# Patient Record
Sex: Male | Born: 1975 | Race: Black or African American | Hispanic: No | Marital: Married | Smoking: Never smoker
Health system: Southern US, Community
[De-identification: ages and names within clinical notes are randomized; demographics above are authoritative.]

## PROBLEM LIST (undated history)

## (undated) DIAGNOSIS — IMO0001 Reserved for inherently not codable concepts without codable children: Secondary | ICD-10-CM

## (undated) DIAGNOSIS — R739 Hyperglycemia, unspecified: Secondary | ICD-10-CM

## (undated) DIAGNOSIS — R42 Dizziness and giddiness: Secondary | ICD-10-CM

## (undated) DIAGNOSIS — R030 Elevated blood-pressure reading, without diagnosis of hypertension: Secondary | ICD-10-CM

## (undated) DIAGNOSIS — I10 Essential (primary) hypertension: Secondary | ICD-10-CM

## (undated) DIAGNOSIS — E785 Hyperlipidemia, unspecified: Secondary | ICD-10-CM

## (undated) DIAGNOSIS — K219 Gastro-esophageal reflux disease without esophagitis: Secondary | ICD-10-CM

## (undated) HISTORY — DX: Elevated blood-pressure reading, without diagnosis of hypertension: R03.0

## (undated) HISTORY — DX: Hyperlipidemia, unspecified: E78.5

## (undated) HISTORY — DX: Gastro-esophageal reflux disease without esophagitis: K21.9

## (undated) HISTORY — DX: Dizziness and giddiness: R42

## (undated) HISTORY — DX: Reserved for inherently not codable concepts without codable children: IMO0001

## (undated) HISTORY — DX: Hyperglycemia, unspecified: R73.9

## (undated) HISTORY — DX: Essential (primary) hypertension: I10

---

## 2003-07-15 ENCOUNTER — Other Ambulatory Visit: Admission: RE | Admit: 2003-07-15 | Discharge: 2003-07-15 | Payer: Self-pay | Admitting: Dermatology

## 2003-07-27 ENCOUNTER — Ambulatory Visit (HOSPITAL_COMMUNITY): Admission: RE | Admit: 2003-07-27 | Discharge: 2003-07-27 | Payer: Self-pay | Admitting: Family Medicine

## 2004-08-01 ENCOUNTER — Ambulatory Visit (HOSPITAL_COMMUNITY): Admission: RE | Admit: 2004-08-01 | Discharge: 2004-08-01 | Payer: Self-pay | Admitting: Family Medicine

## 2004-08-15 ENCOUNTER — Ambulatory Visit (HOSPITAL_COMMUNITY): Admission: RE | Admit: 2004-08-15 | Discharge: 2004-08-15 | Payer: Self-pay | Admitting: Family Medicine

## 2008-07-14 ENCOUNTER — Emergency Department: Payer: Self-pay | Admitting: Emergency Medicine

## 2010-07-03 ENCOUNTER — Ambulatory Visit (HOSPITAL_COMMUNITY): Admission: RE | Admit: 2010-07-03 | Discharge: 2010-07-03 | Payer: Self-pay | Admitting: Family Medicine

## 2011-02-02 NOTE — Procedures (Signed)
NAMESUDEEP, Steven Gallagher                ACCOUNT NO.:  [id number]   MEDICAL RECORD NO.:  [id number]          PATIENT TYPE:  OUT   LOCATION:  [facility]                          FACILITY:  [facility]   PHYSICIAN:  Scott A. Gerda Diss, MD    DATE OF BIRTH:  Aug 22, 1976   DATE OF PROCEDURE:  DATE OF DISCHARGE:                                    STRESS TEST   INDICATIONS:  Chest discomfort.   PROTOCOL:  Bruce protocol.  Resting EKG no acute ST segment changes are  noted.  Normal blood pressure.   HEART RATE RESPONSE TO EXERCISE:  The patient's heart rate gradually went up  through the next several stages.  He went through the first 3 stages with no  problems.  In the fourth stage he did have an occasional PVC, but no  couplets and reached a peak heart rate of 178.   ST SEGMENT CHANGES IN RESPONSE TO EXERCISE:  The patient did not have any  significant ST segment changes during the exercise and was considered  normal.   BLOOD PRESSURE RESPONSE TO EXERCISE:  The patient had a mild increase in  blood pressure, but it is not considered abnormal.   RECOVERY PHASE:  Fine.   SYMPTOMATOLOGY:  None.   INTERPRETATION:  Normal stress test.  Encourage regular exercise routine.     Scot   SAL/MEDQ  D:  08/15/2004  T:  08/15/2004  Job:  [id number]

## 2013-01-19 ENCOUNTER — Other Ambulatory Visit: Payer: Self-pay | Admitting: *Deleted

## 2013-01-19 MED ORDER — PANTOPRAZOLE SODIUM 40 MG PO TBEC: 40.0000 mg | DELAYED_RELEASE_TABLET | Freq: Every day | ORAL | Status: DC

## 2013-01-27 ENCOUNTER — Ambulatory Visit (INDEPENDENT_AMBULATORY_CARE_PROVIDER_SITE_OTHER): Payer: Managed Care, Other (non HMO) | Admitting: Family Medicine

## 2013-01-27 ENCOUNTER — Encounter: Payer: Self-pay | Admitting: Family Medicine

## 2013-01-27 VITALS — BP 132/90 | Temp 98.4°F | Wt 159.2 lb

## 2013-01-27 DIAGNOSIS — J029 Acute pharyngitis, unspecified: Secondary | ICD-10-CM

## 2013-01-27 DIAGNOSIS — K219 Gastro-esophageal reflux disease without esophagitis: Secondary | ICD-10-CM | POA: Insufficient documentation

## 2013-01-27 MED ORDER — AZITHROMYCIN 250 MG PO TABS: ORAL_TABLET | ORAL | Status: DC

## 2013-01-27 NOTE — Progress Notes (Signed)
  Subjective:    Patient ID: Steven Gallagher, male    DOB: [birth year], 37 y.o.   MRN: [id number]  Sore Throat  This is a new problem. The current episode started in the past 7 days (face tingling). The problem has been gradually worsening. There has been no fever. The pain is at a severity of 4/10. The pain is moderate. He has tried nothing for the symptoms.   Reflux overall stable on daily protonix  Mild allergy symptoms this few weeks Review of Systems    ROS otherwise negative Objective:   Physical Exam  Alert no acute distress. Blood pressure improved to 130/82 on repeat. Lungs clear. Heart regular rate and rhythm. Pharynx erythematous. Glands swollen somewhat anteriorly. TMs normal. Abdominal exam benign.      Assessment & Plan:  Impression #1 pharyngitis with lymphadenitis #2 reflux clinically stable. Plan Z-Pak. Symptomatic care discussed. Be sure to use Protonix daily. WSL

## 2013-03-16 ENCOUNTER — Encounter: Payer: Self-pay | Admitting: *Deleted

## 2013-03-16 ENCOUNTER — Telehealth: Payer: Self-pay | Admitting: Family Medicine

## 2013-03-16 DIAGNOSIS — Z0000 Encounter for general adult medical examination without abnormal findings: Secondary | ICD-10-CM

## 2013-03-16 DIAGNOSIS — Z79899 Other long term (current) drug therapy: Secondary | ICD-10-CM

## 2013-03-16 DIAGNOSIS — Z7251 High risk heterosexual behavior: Secondary | ICD-10-CM

## 2013-03-16 NOTE — Telephone Encounter (Signed)
Nurse/Dr. Lorin Picket  Patient is requesting for blood work. (HIV testing) ** He does have an upcoming appointment 03/24/2013 and would like to have the blood work done before the appointment.

## 2013-03-16 NOTE — Telephone Encounter (Signed)
Blood work papers printed and left up front for patient pick up. Patient notified. 

## 2013-03-16 NOTE — Telephone Encounter (Signed)
Is he due his regular blood work also?

## 2013-03-16 NOTE — Telephone Encounter (Signed)
Lip liv met7 hiv

## 2013-03-17 LAB — HEPATIC FUNCTION PANEL
ALT: 19 U/L (ref 0–53)
AST: 24 U/L (ref 0–37)
Albumin: 4.7 g/dL (ref 3.5–5.2)
Alkaline Phosphatase: 81 U/L (ref 39–117)
Bilirubin, Direct: 0.1 mg/dL (ref 0.0–0.3)
Indirect Bilirubin: 0.6 mg/dL (ref 0.0–0.9)
Total Bilirubin: 0.7 mg/dL (ref 0.3–1.2)
Total Protein: 7.3 g/dL (ref 6.0–8.3)

## 2013-03-17 LAB — LIPID PANEL
Cholesterol: 176 mg/dL (ref 0–200)
HDL: 56 mg/dL (ref >39)
LDL Cholesterol: 104 mg/dL — ABNORMAL HIGH (ref 0–99)
Total CHOL/HDL Ratio: 3.1 Ratio
Triglycerides: 81 mg/dL (ref <150)
VLDL: 16 mg/dL (ref 0–40)

## 2013-03-17 LAB — BASIC METABOLIC PANEL
BUN: 15 mg/dL (ref 6–23)
CO2: 27 mEq/L (ref 19–32)
Calcium: 9.8 mg/dL (ref 8.4–10.5)
Chloride: 102 mEq/L (ref 96–112)
Creat: 1.01 mg/dL (ref 0.50–1.35)
Glucose, Bld: 95 mg/dL (ref 70–99)
Potassium: 3.9 mEq/L (ref 3.5–5.3)
Sodium: 141 mEq/L (ref 135–145)

## 2013-03-18 LAB — HIV ANTIBODY (ROUTINE TESTING W REFLEX): HIV: NONREACTIVE

## 2013-03-24 ENCOUNTER — Ambulatory Visit (INDEPENDENT_AMBULATORY_CARE_PROVIDER_SITE_OTHER): Payer: Managed Care, Other (non HMO) | Admitting: Family Medicine

## 2013-03-24 ENCOUNTER — Encounter: Payer: Self-pay | Admitting: Family Medicine

## 2013-03-24 VITALS — BP 130/68 | Temp 98.6°F | Wt 164.0 lb

## 2013-03-24 DIAGNOSIS — K219 Gastro-esophageal reflux disease without esophagitis: Secondary | ICD-10-CM

## 2013-03-24 NOTE — Progress Notes (Signed)
  Subjective:    Patient ID: Steven Gallagher, male    DOB: 1976/06/27, 37 y.o.   MRN: [id number]  Gastrophageal Reflux This is a new problem. The current episode started 1 to 4 weeks ago. The symptoms are aggravated by caffeine. Treatments tried: protonix 40mg  one a day.   patient also is concerned regarding his lab work. This was reviewed with him in detail. Overall looks good I encouraged him to follow a healthy diet. He does take in more caffeine than what he should I encouraged him to cut back on this he sometimes gets throat discomfort at nighttime but does not cause any swallowing difficulties no soreness during the day PMH reflux, minimal hyperlipidemia.    Review of Systems    no chest pain no shortness of breath no swelling in the legs Objective:   Physical Exam Lungs are clear hearts regular abdomen soft pulse normal extremities no edema blood pressure noted       Assessment & Plan:  Reflux good control cut back on caffeine use if continued throat discomfort let us know we will set up ENT visit Followup in 6 months Lab work looks good reassurance

## 2013-05-05 ENCOUNTER — Ambulatory Visit (INDEPENDENT_AMBULATORY_CARE_PROVIDER_SITE_OTHER): Payer: Managed Care, Other (non HMO) | Admitting: Family Medicine

## 2013-05-05 ENCOUNTER — Encounter: Payer: Self-pay | Admitting: Family Medicine

## 2013-05-05 VITALS — BP 132/90 | Temp 98.5°F | Ht 68.0 in | Wt 163.0 lb

## 2013-05-05 DIAGNOSIS — R197 Diarrhea, unspecified: Secondary | ICD-10-CM

## 2013-05-05 LAB — CBC WITH DIFFERENTIAL/PLATELET
Basophils Absolute: 0 10*3/uL (ref 0.0–0.1)
Basophils Relative: 1 % (ref 0–1)
Eosinophils Absolute: 0.1 10*3/uL (ref 0.0–0.7)
Eosinophils Relative: 2 % (ref 0–5)
HCT: 43.5 % (ref 39.0–52.0)
Hemoglobin: 14.5 g/dL (ref 13.0–17.0)
Lymphocytes Relative: 35 % (ref 12–46)
Lymphs Abs: 2 10*3/uL (ref 0.7–4.0)
MCH: 25.9 pg — ABNORMAL LOW (ref 26.0–34.0)
MCHC: 33.3 g/dL (ref 30.0–36.0)
MCV: 77.8 fL — ABNORMAL LOW (ref 78.0–100.0)
Monocytes Absolute: 0.6 10*3/uL (ref 0.1–1.0)
Monocytes Relative: 10 % (ref 3–12)
Neutro Abs: 2.9 10*3/uL (ref 1.7–7.7)
Neutrophils Relative %: 52 % (ref 43–77)
Platelets: 217 10*3/uL (ref 150–400)
RBC: 5.59 MIL/uL (ref 4.22–5.81)
RDW: 14.7 % (ref 11.5–15.5)
WBC: 5.6 10*3/uL (ref 4.0–10.5)

## 2013-05-05 MED ORDER — ONDANSETRON HCL 8 MG PO TABS: 8.0000 mg | ORAL_TABLET | Freq: Three times a day (TID) | ORAL | Status: DC | PRN

## 2013-05-05 NOTE — Progress Notes (Signed)
  Subjective:    Patient ID: Steven Gallagher, male    DOB: 1976-06-29, 37 y.o.   MRN: [id number]  Diarrhea  This is a new problem. The current episode started 1 to 4 weeks ago. Associated symptoms include headaches. Associated symptoms comments: Neck pain, nausea. He has tried nothing for the symptoms.   Patient states she did have a small amount of blood in the stool when he had the diarrhea but he has not noticed any blood in any other time he denies abdominal pain constipation or change in bowel habits or weight loss. No family history of premature colon cancer.   Review of Systems  Gastrointestinal: Positive for diarrhea.  Neurological: Positive for headaches.       Objective:   Physical Exam Lungs are clear hearts regular pulse normal skin warm dry no rashes noted abdomen soft no guarding rebound or tenderness. Throat is normal eardrums normal.       Assessment & Plan:  Nausea-patient did have some diarrhea I believe this more likely a viral process Zofran as necessary no need for any x-rays at this point. Should gradually get better Patient with small amount of blood with the diarrhea I recommend Hemoccult cards and a CBC. I don't feel patient needs colonoscopy currently. I told him if he has reoccurrence of blood in the stool he needs to let us know I think it is more likely hemorrhoids related to the diarrhea.

## 2013-05-20 ENCOUNTER — Other Ambulatory Visit (INDEPENDENT_AMBULATORY_CARE_PROVIDER_SITE_OTHER): Payer: Managed Care, Other (non HMO) | Admitting: *Deleted

## 2013-05-20 DIAGNOSIS — R197 Diarrhea, unspecified: Secondary | ICD-10-CM

## 2013-05-20 LAB — POC HEMOCCULT BLD/STL (HOME/3-CARD/SCREEN)
Card #1 Date: 8292014
Card #2 Date: 8312014
Card #2 Fecal Occult Blod, POC: NEGATIVE
Card #3 Date: 9012014
Card #3 Fecal Occult Blood, POC: NEGATIVE
Fecal Occult Blood, POC: NEGATIVE

## 2013-05-26 ENCOUNTER — Other Ambulatory Visit: Payer: Self-pay

## 2013-05-26 DIAGNOSIS — Z7251 High risk heterosexual behavior: Secondary | ICD-10-CM

## 2013-05-26 DIAGNOSIS — R5381 Other malaise: Secondary | ICD-10-CM

## 2013-05-26 LAB — CBC WITH DIFFERENTIAL/PLATELET
Basophils Absolute: 0 10*3/uL (ref 0.0–0.1)
Basophils Relative: 0 % (ref 0–1)
Eosinophils Absolute: 0.1 10*3/uL (ref 0.0–0.7)
Eosinophils Relative: 1 % (ref 0–5)
HCT: 41.4 % (ref 39.0–52.0)
Hemoglobin: 13.7 g/dL (ref 13.0–17.0)
Lymphocytes Relative: 23 % (ref 12–46)
Lymphs Abs: 2.7 10*3/uL (ref 0.7–4.0)
MCH: 26.1 pg (ref 26.0–34.0)
MCHC: 33.1 g/dL (ref 30.0–36.0)
MCV: 78.9 fL (ref 78.0–100.0)
Monocytes Absolute: 0.7 10*3/uL (ref 0.1–1.0)
Monocytes Relative: 6 % (ref 3–12)
Neutro Abs: 8.5 10*3/uL — ABNORMAL HIGH (ref 1.7–7.7)
Neutrophils Relative %: 70 % (ref 43–77)
Platelets: 238 10*3/uL (ref 150–400)
RBC: 5.25 MIL/uL (ref 4.22–5.81)
RDW: 13.7 % (ref 11.5–15.5)
WBC: 12 10*3/uL — ABNORMAL HIGH (ref 4.0–10.5)

## 2013-05-27 LAB — HIV ANTIBODY (ROUTINE TESTING W REFLEX): HIV: NONREACTIVE

## 2013-05-27 LAB — IRON AND TIBC
%SAT: 23 % (ref 20–55)
Iron: 91 ug/dL (ref 42–165)
TIBC: 398 ug/dL (ref 215–435)
UIBC: 307 ug/dL (ref 125–400)

## 2013-05-27 LAB — FERRITIN: Ferritin: 142 ng/mL (ref 22–322)

## 2013-05-28 ENCOUNTER — Encounter: Payer: Self-pay | Admitting: Family Medicine

## 2013-06-02 ENCOUNTER — Ambulatory Visit (INDEPENDENT_AMBULATORY_CARE_PROVIDER_SITE_OTHER): Payer: Managed Care, Other (non HMO) | Admitting: Family Medicine

## 2013-06-02 ENCOUNTER — Encounter: Payer: Self-pay | Admitting: Family Medicine

## 2013-06-02 VITALS — BP 140/94 | Ht 68.0 in | Wt 164.0 lb

## 2013-06-02 DIAGNOSIS — R197 Diarrhea, unspecified: Secondary | ICD-10-CM

## 2013-06-02 NOTE — Progress Notes (Signed)
  Subjective:    Patient ID: Steven Gallagher, male    DOB: [birth year], 37 y.o.   MRN: [id number]  HPIFollow up on bloodwork.  No concerns.   I reviewed over her lab work with him in detail. Overall he is doing well. Denies seeing any blood in his stools. States appetite good no fever sweats or chills. See previous notes family history noncontributory  Review of Systems    no vomiting diarrhea fever chills Objective:   Physical Exam  His lungs clear hearts regular pulse normal abdomen soft no guarding or rebound pulses are normal blood pressure good      Assessment & Plan:  Abdominal pain and diarrhea have resolved patient's recent lab work all looks normal no need for any type of intervention currently he is to followup again within 6 months. He does relate intermittent nausea Zofran was prescribed if he has ongoing trouble he is to followup sooner

## 2013-07-14 ENCOUNTER — Ambulatory Visit (INDEPENDENT_AMBULATORY_CARE_PROVIDER_SITE_OTHER): Payer: Managed Care, Other (non HMO) | Admitting: Family Medicine

## 2013-07-14 ENCOUNTER — Encounter: Payer: Self-pay | Admitting: Family Medicine

## 2013-07-14 VITALS — BP 130/88 | Temp 98.2°F | Ht 68.0 in | Wt 167.0 lb

## 2013-07-14 DIAGNOSIS — J029 Acute pharyngitis, unspecified: Secondary | ICD-10-CM

## 2013-07-14 DIAGNOSIS — B349 Viral infection, unspecified: Secondary | ICD-10-CM

## 2013-07-14 DIAGNOSIS — B9789 Other viral agents as the cause of diseases classified elsewhere: Secondary | ICD-10-CM

## 2013-07-14 LAB — POCT RAPID STREP A (OFFICE): Rapid Strep A Screen: NEGATIVE

## 2013-07-14 NOTE — Progress Notes (Signed)
  Subjective:    Patient ID: Steven Gallagher, male    DOB: 02/24/1976, 37 y.o.   MRN: [id number]  Sore Throat  This is a new problem. The current episode started in the past 7 days. There has been no fever.   Patient notes considerable sore throat. Worse in the morning. No headache no fever no chills. Positive history of reflux.   Review of Systems No cough or congestion no true fever no weight loss weight gain ROS otherwise negative    Objective:   Physical Exam  Alert HEENT slight nasal congestion slight erythema pharynx neck supple no lymphadenopathy lungs clear heart regular rate and rhythm.      Assessment & Plan:  Impression probable viral syndrome discussed #2 reflux discussed plan symptomatic care discussed. Maintain Protonix when necessary. Rapid strep screen negative doubt that culture will be positive discussed. WSL

## 2013-07-15 LAB — STREP A DNA PROBE: GASP: NEGATIVE

## 2013-07-22 ENCOUNTER — Ambulatory Visit: Payer: Managed Care, Other (non HMO) | Admitting: Family Medicine

## 2013-07-28 ENCOUNTER — Encounter: Payer: Self-pay | Admitting: Family Medicine

## 2013-07-28 ENCOUNTER — Ambulatory Visit (INDEPENDENT_AMBULATORY_CARE_PROVIDER_SITE_OTHER): Payer: Managed Care, Other (non HMO) | Admitting: Family Medicine

## 2013-07-28 VITALS — BP 122/70 | Ht 68.0 in | Wt 166.4 lb

## 2013-07-28 DIAGNOSIS — R7309 Other abnormal glucose: Secondary | ICD-10-CM

## 2013-07-28 DIAGNOSIS — R7303 Prediabetes: Secondary | ICD-10-CM

## 2013-07-28 DIAGNOSIS — R739 Hyperglycemia, unspecified: Secondary | ICD-10-CM

## 2013-07-28 LAB — GLUCOSE, POCT (MANUAL RESULT ENTRY): POC Glucose: 90 mg/dl (ref 70–99)

## 2013-07-28 NOTE — Progress Notes (Signed)
  Subjective:    Patient ID: Steven Gallagher, male    DOB: Mar 15, 1976, 37 y.o.   MRN: [id number]  HPIFollow up on bloodwork that was done through employer. A1C was 6.2 on 07/09/13 and blood sugar was 107. Patient fasted for 12 hours prior to test.   Long discussion held no family history he relates some increased thirst and urination does not eat a lot of starches. Does not exercise a lot.  Review of Systems  Constitutional: Positive for chills. Negative for fever, activity change and appetite change.  HENT: Negative for rhinorrhea and sinus pressure.   Respiratory: Negative for choking and shortness of breath.   Cardiovascular: Negative for chest pain.  Gastrointestinal: Negative for abdominal pain.  Endocrine: Positive for polydipsia and polyuria.  Musculoskeletal: Negative for arthralgias.       Objective:   Physical Exam  Vitals reviewed. Constitutional: He appears well-developed and well-nourished.  HENT:  Head: Normocephalic.  Cardiovascular: Normal rate, regular rhythm and normal heart sounds.   No murmur heard. Pulmonary/Chest: Effort normal and breath sounds normal.  Abdominal: Soft.  Musculoskeletal: He exhibits no edema.  Lymphadenopathy:    He has no cervical adenopathy.          Assessment & Plan:  New onset prediabetes dietary measures all discussed. Importance to see dietitian discussed. Exercise on regular basis.  The patient mentioned briefly while he was in the office that he got chills when I talked to him about it he states that they last no more than a few moments then goes away no fevers with it no sweats then after I left the room and was in with a different patient he told the nurse that he has chills that last for several minutes at a time but come and go I told the nurse to tell the patient to check his temperature on a regular basis when he has these chills and send [place] a report back within the next 1-2 weeks his recent blood work including CBC looked  good his physical exam is good if he has ongoing fever chills and sweats we may need to do further testing

## 2013-09-09 ENCOUNTER — Other Ambulatory Visit: Payer: Self-pay | Admitting: Family Medicine

## 2013-10-08 ENCOUNTER — Other Ambulatory Visit: Payer: Self-pay | Admitting: Family Medicine

## 2013-10-27 ENCOUNTER — Telehealth: Payer: Self-pay | Admitting: Family Medicine

## 2013-10-27 NOTE — Telephone Encounter (Signed)
Should be fine, I see no problem with it

## 2013-10-27 NOTE — Telephone Encounter (Signed)
Discussed with patient

## 2013-10-27 NOTE — Telephone Encounter (Signed)
Patient went to dermatologist and was prescribed some medication that he would like to talk to our nurse about.

## 2013-10-27 NOTE — Telephone Encounter (Signed)
Went to dermatologist for body odor and he recommended he take ginger 1000, cinnamon 500mg , turmeric 1000 mg and chlorofresh 1000mg . Patient wanted to know what you thought it was safe for him to take.

## 2013-11-30 ENCOUNTER — Telehealth: Payer: Self-pay | Admitting: Family Medicine

## 2013-11-30 DIAGNOSIS — E785 Hyperlipidemia, unspecified: Secondary | ICD-10-CM

## 2013-11-30 NOTE — Telephone Encounter (Signed)
Had lipid, liver and met 7 on 07/07/13.

## 2013-11-30 NOTE — Telephone Encounter (Signed)
Yes just lipid, thanks

## 2013-11-30 NOTE — Telephone Encounter (Addendum)
Patient just needs a lipid profile ?

## 2013-11-30 NOTE — Telephone Encounter (Signed)
Patient does need LDL. We will do hemoglobin A1c here in the office.

## 2013-11-30 NOTE — Telephone Encounter (Signed)
Pt would like to know if he needs BW for his diabetic check up on 3/23

## 2013-12-01 NOTE — Telephone Encounter (Signed)
Blood work ordered in Epic. Patient notified. 

## 2013-12-03 LAB — LIPID PANEL
Cholesterol: 159 mg/dL (ref 0–200)
HDL: 47 mg/dL (ref >39)
LDL Cholesterol: 99 mg/dL (ref 0–99)
Total CHOL/HDL Ratio: 3.4 Ratio
Triglycerides: 67 mg/dL (ref <150)
VLDL: 13 mg/dL (ref 0–40)

## 2013-12-07 ENCOUNTER — Encounter: Payer: Self-pay | Admitting: Family Medicine

## 2013-12-07 ENCOUNTER — Ambulatory Visit (INDEPENDENT_AMBULATORY_CARE_PROVIDER_SITE_OTHER): Payer: Managed Care, Other (non HMO) | Admitting: Family Medicine

## 2013-12-07 VITALS — BP 138/86 | Ht 68.0 in | Wt 163.4 lb

## 2013-12-07 DIAGNOSIS — F4010 Social phobia, unspecified: Secondary | ICD-10-CM

## 2013-12-07 DIAGNOSIS — R7309 Other abnormal glucose: Secondary | ICD-10-CM

## 2013-12-07 DIAGNOSIS — R739 Hyperglycemia, unspecified: Secondary | ICD-10-CM

## 2013-12-07 DIAGNOSIS — R7303 Prediabetes: Secondary | ICD-10-CM

## 2013-12-07 LAB — POCT GLYCOSYLATED HEMOGLOBIN (HGB A1C): Hemoglobin A1C: 5.7

## 2013-12-07 MED ORDER — ESCITALOPRAM OXALATE 10 MG PO TABS: 10.0000 mg | ORAL_TABLET | Freq: Every day | ORAL | Status: DC

## 2013-12-07 NOTE — Progress Notes (Signed)
   Subjective:    Patient ID: Steven Gallagher, male    DOB: July 15, 1976, 38 y.o.   MRN: [id number]  HPI  Patient arrives for a follow up on his sugar. He has been trying eat healthy. He stays very busy with work. Patient would also like to discuss anxiety.  Patient denies being depressed. He does state he gets anxious around crowds. He relates he avoids things because of that. It's a pervasive problem has been going on for months to years.  Review of Systems  Constitutional: Negative for activity change, appetite change and fatigue.  Endocrine: Negative for polydipsia and polyphagia.  Neurological: Negative for weakness.  Psychiatric/Behavioral: Negative for confusion.       Objective:   Physical Exam  Vitals reviewed. Constitutional: He appears well-nourished. No distress.  Cardiovascular: Normal rate, regular rhythm and normal heart sounds.   No murmur heard. Pulmonary/Chest: Effort normal and breath sounds normal. No respiratory distress.  Musculoskeletal: He exhibits no edema.  Lymphadenopathy:    He has no cervical adenopathy.  Neurological: He is alert.  Psychiatric: His behavior is normal.          Assessment & Plan:  #1 prediabetes actually under very good control. No need to recheck hemoglobin A1c currently. Patient encouraged to exercise watch diet closely in addition to this followup later this fall 6 months  #2 his workplace does lab work in the fall he will send [place] a copy  #3 anxiety generalized anxiety disorder-has anxiety being around crowds causes him to sweat which he feels causes the strange odors he's been a 3 dermatologist none of them and unable to swallow but I told him his best approach would be trying Lexapro 10 mg daily if it causes any side effects or if he starts getting depressed or suicidal he is to immediately followup. Also stop medicine if any problems. Otherwise recheck back in several weeks

## 2014-02-05 ENCOUNTER — Ambulatory Visit: Payer: Managed Care, Other (non HMO) | Admitting: Family Medicine

## 2014-02-11 ENCOUNTER — Encounter: Payer: Self-pay | Admitting: Family Medicine

## 2014-02-11 ENCOUNTER — Ambulatory Visit (INDEPENDENT_AMBULATORY_CARE_PROVIDER_SITE_OTHER): Payer: Managed Care, Other (non HMO) | Admitting: Family Medicine

## 2014-02-11 VITALS — BP 112/74 | Ht 68.0 in | Wt 160.0 lb

## 2014-02-11 DIAGNOSIS — F411 Generalized anxiety disorder: Secondary | ICD-10-CM | POA: Insufficient documentation

## 2014-02-11 MED ORDER — SERTRALINE HCL 50 MG PO TABS: 50.0000 mg | ORAL_TABLET | Freq: Every day | ORAL | Status: DC

## 2014-02-11 NOTE — Progress Notes (Signed)
   Subjective:    Patient ID: Steven Gallagher, male    DOB: 1975-10-19, 38 y.o.   MRN: [id number]  HPIFollow up on anxiety. Taking lexapro 10mg . Pt states med he not helping with anxiety. He just states he feels like people smell odor on him when he is around them.  Concerns about Gas. Has tried Gas X. Eats a lot of fiber this could be the reason he is having gas. He denies blood in stool.    Review of Systems    he denies chest pain shortness breath nausea vomiting diarrhea Objective:   Physical Exam Lungs clear hearts regular I smell no odor on this person no masses extremities no edema      Assessment & Plan:  Patient states he gets anxious when he is around people cause he states he feels that they can sometimes smell odor on him. He's been to dermatologist he's been to other doctors no particular odor has been found or caused by other specialists I counseled this gentleman that this may be something that he is more sensitive to other people really are he would like to try a different medicine we will stop the Lexapro try Zoloft 50 mg daily if it causes any problems he will followup 3 months

## 2014-03-02 ENCOUNTER — Ambulatory Visit (INDEPENDENT_AMBULATORY_CARE_PROVIDER_SITE_OTHER): Payer: Managed Care, Other (non HMO) | Admitting: Family Medicine

## 2014-03-02 ENCOUNTER — Encounter: Payer: Self-pay | Admitting: Family Medicine

## 2014-03-02 VITALS — BP 138/90 | Ht 68.0 in | Wt 160.0 lb

## 2014-03-02 DIAGNOSIS — R079 Chest pain, unspecified: Secondary | ICD-10-CM

## 2014-03-02 DIAGNOSIS — R0789 Other chest pain: Secondary | ICD-10-CM

## 2014-03-02 NOTE — Progress Notes (Signed)
   Subjective:    Patient ID: Steven Gallagher, male    DOB: 01-15-1976, 38 y.o.   MRN: [id number]  HPI Patient arrives with complaint of feeling like his heart is throbbing for a week off and on. No SOB or nausea.  He relates that these pains last one to 2 seconds may happen intermittently sometimes with activity sometimes without activity. He denies any substernal chest tightness pressure pain with activity. He does not have any history of heart disease Review of Systems He denies substernal chest pressure he denies shortness of breath nausea vomiting sweats. He denies any injury. No fevers.    Objective:   Physical Exam His lungs are clear his heart is regular chest wall nontender abdomen is soft extremities no edema skin warm dry pulses normal blood pressure on recheck is good neck no masses.  EKG no acute ST segment changes.       Assessment & Plan:  Musculoskeletal chest pain-I. do not find evidence of any type of underlying cardiac disease. I would not recommend a stress test. He may use ibuprofen when necessary. If he has ongoing troubles or progressive troubles he needs to followup.

## 2014-03-15 ENCOUNTER — Encounter: Payer: Self-pay | Admitting: Family Medicine

## 2014-05-04 ENCOUNTER — Telehealth: Payer: Self-pay | Admitting: Family Medicine

## 2014-05-04 NOTE — Telephone Encounter (Signed)
Patient was changed form Lexapro to Zoloft in may and advised to schedule aa follow up office visit in 3 months.

## 2014-05-04 NOTE — Telephone Encounter (Signed)
Typically if 50 mg is not helping enough we go to 100 mg. Patient would be wise to followup.

## 2014-05-04 NOTE — Telephone Encounter (Signed)
Patient would like something a little stronger than sertraline (ZOLOFT) 50 MG tablet for his anxiety.    [facility]

## 2014-05-05 MED ORDER — SERTRALINE HCL 100 MG PO TABS: 100.0000 mg | ORAL_TABLET | Freq: Every day | ORAL | Status: DC

## 2014-05-05 NOTE — Telephone Encounter (Signed)
New dose sent in. Pt notified and is scheduling a f/u visit with Dr. Nicki Reaper regarding the increased dose.

## 2014-05-26 ENCOUNTER — Ambulatory Visit (INDEPENDENT_AMBULATORY_CARE_PROVIDER_SITE_OTHER): Payer: Managed Care, Other (non HMO) | Admitting: Family Medicine

## 2014-05-26 ENCOUNTER — Encounter: Payer: Self-pay | Admitting: Family Medicine

## 2014-05-26 ENCOUNTER — Ambulatory Visit: Payer: Managed Care, Other (non HMO) | Admitting: Family Medicine

## 2014-05-26 VITALS — BP 122/82 | Ht 68.0 in | Wt 162.1 lb

## 2014-05-26 DIAGNOSIS — J019 Acute sinusitis, unspecified: Secondary | ICD-10-CM

## 2014-05-26 DIAGNOSIS — F411 Generalized anxiety disorder: Secondary | ICD-10-CM

## 2014-05-26 DIAGNOSIS — Z125 Encounter for screening for malignant neoplasm of prostate: Secondary | ICD-10-CM

## 2014-05-26 MED ORDER — CEFPROZIL 500 MG PO TABS: 500.0000 mg | ORAL_TABLET | Freq: Two times a day (BID) | ORAL | Status: DC

## 2014-05-26 MED ORDER — SERTRALINE HCL 100 MG PO TABS: 100.0000 mg | ORAL_TABLET | Freq: Every day | ORAL | Status: DC

## 2014-05-26 NOTE — Progress Notes (Signed)
   Subjective:    Patient ID: Steven Gallagher, male    DOB: 1975-09-30, 38 y.o.   MRN: [id number]  Anxiety Presents for follow-up visit. Onset was 1 to 6 months ago. Symptoms occur occasionally. Nothing aggravates the symptoms.   There are no known risk factors. Treatments tried: zoloft. The treatment provided moderate relief. Compliance with prior treatments has been good. Compliance with medications is 76-100%.  Sinusitis This is a new problem. The current episode started in the past 7 days. The problem is unchanged. There has been no fever. The pain is moderate. Associated symptoms include congestion and coughing. (Throat is burning, diarrhea) Past treatments include oral decongestants. The treatment provided no relief.    Patient would like bloodwork to be checked for prostate cancer.  Patient at times worry that he has a boot or the other people smell I informed him I do not smell any abnormal odor with him.   Review of Systems  HENT: Positive for congestion.   Respiratory: Positive for cough.        Objective:   Physical Exam  Mouth sinusitis worse neck supple lungs clear he does have mild sinus issues. Also hoarseness in his voice Prostate exam normal     Assessment & Plan:  Patient has a fear of prostate cancer had a close family member did in his early 85s. We will check PSA his prostate exam is normal  Anxiety issues doing better on Zoloft continue this.  Mouth sinusitis antibiotics prescribed

## 2014-05-27 ENCOUNTER — Encounter: Payer: Self-pay | Admitting: Family Medicine

## 2014-05-27 LAB — PSA: PSA: 1.26 ng/mL (ref <=4.00)

## 2014-08-19 ENCOUNTER — Ambulatory Visit (INDEPENDENT_AMBULATORY_CARE_PROVIDER_SITE_OTHER): Payer: Managed Care, Other (non HMO) | Admitting: Nurse Practitioner

## 2014-08-19 ENCOUNTER — Encounter: Payer: Self-pay | Admitting: Nurse Practitioner

## 2014-08-19 VITALS — BP 114/76 | Temp 97.7°F | Ht 68.0 in | Wt 170.0 lb

## 2014-08-19 DIAGNOSIS — R42 Dizziness and giddiness: Secondary | ICD-10-CM

## 2014-08-19 DIAGNOSIS — R7309 Other abnormal glucose: Secondary | ICD-10-CM

## 2014-08-19 DIAGNOSIS — L74519 Primary focal hyperhidrosis, unspecified: Secondary | ICD-10-CM

## 2014-08-19 DIAGNOSIS — R61 Generalized hyperhidrosis: Secondary | ICD-10-CM

## 2014-08-19 DIAGNOSIS — J300 Vasomotor rhinitis: Secondary | ICD-10-CM

## 2014-08-19 DIAGNOSIS — R7303 Prediabetes: Secondary | ICD-10-CM

## 2014-08-19 NOTE — Patient Instructions (Addendum)
My fitness pal Clinical strength nasacort AQ as directed

## 2014-08-22 ENCOUNTER — Encounter: Payer: Self-pay | Admitting: Nurse Practitioner

## 2014-08-22 DIAGNOSIS — R61 Generalized hyperhidrosis: Secondary | ICD-10-CM | POA: Insufficient documentation

## 2014-08-22 NOTE — Progress Notes (Signed)
Subjective:  Presents to discuss his recent lab work which he has with him today. Has been experiencing occasional mild intermittent vertigo mostly associated with sudden position change. Has started a new medication prescribed by his dermatologist for excessive sweating around the head and upper back area. Causes and odor. Mild head congestion. No cough. No fever. Ear pressure. No sore throat. No fever. Has gained about 10 pounds over the past few months.   Objective:   BP 114/76 mmHg  Temp(Src) 97.7 F (36.5 C) (Oral)  Ht 5\' 8"  (1.727 m)  Wt 170 lb (77.111 kg)  BMI 25.85 kg/m2 NAD. Alert, oriented. TMs cloudy effusion, no erythema. Pharynx injected with clear PND noted. Neck supple with mild soft anterior adenopathy. Lungs clear. Heart regular rate rhythm. Lab work dated 07/05/2014: LDL 128, HDL 56. Hemoglobin A1c 6.1. Hemoglobin A1c on 12/07/2013 was 5.7.  Assessment: Prediabetes  Hyperhidrosis  Vasomotor rhinitis  Vertigo  Plan: Recommend regular activity and healthy diet low in sugar and simple carbs. A lengthy discussion regarding lifestyle habits. Dizziness can come from several sources including rhinitis, changes in sugar and insulin levels, and possibly anti-cholinergic effect of Robinul. Warning signs reviewed. Patient to recheck with [place] if dizziness persists or new symptoms develop. Since patient is bald, recommend that he use clinical strength deodorant on his head and neck area to try to avoid oral medications. If he continues the Robinul, avoid other OTC meds that have anti-cholinergic effect. Recommend daily OTC antihistamine and Nasacort AQ as directed. Return in about 3 months (around 11/18/2014). Repeat hemoglobin A1c at that time.

## 2014-09-29 ENCOUNTER — Telehealth: Payer: Self-pay | Admitting: Family Medicine

## 2014-09-29 NOTE — Telephone Encounter (Signed)
Talked to pt, expressed understanding

## 2014-09-29 NOTE — Telephone Encounter (Signed)
#  1 his far his body wash any regular soap used in a properly fashion does adequately take care of things it is not necessary to use antibiotic soap. #2 is follows: Colon Cleanse I don't believe that this is beneficial whatsoever. I believe it is hocus-pocus that tries to sell a bogus approach. No cleanse is necessary for our intestines

## 2014-09-29 NOTE — Telephone Encounter (Signed)
Pt calling to see if you had any recommendations for a body cleanse/bowel cleanse?   Please advise

## 2014-09-29 NOTE — Telephone Encounter (Signed)
Left message to return call 

## 2014-11-22 ENCOUNTER — Telehealth: Payer: Self-pay | Admitting: *Deleted

## 2014-11-22 MED ORDER — ONDANSETRON 8 MG PO TBDP: 8.0000 mg | ORAL_TABLET | Freq: Three times a day (TID) | ORAL | Status: DC | PRN

## 2014-11-22 NOTE — Telephone Encounter (Signed)
This patient sounds like he has a viral gastroenteritis. Typically it will cause nausea vomiting for 12-24 hours along with diarrhea that can go anywhere from 1-3 days. If bloody vomiting or bloody diarrhea then must be seen. I would recommend clear liquids today and if able to tolerate that later today then bland food. May use Zofran 8 mg 3 times a day when necessary nausea , #15. If unable to keep Zofran down can use Phenergan 25 mg suppository every 6 hours when necessary vomiting, #9. Please talk with the patient if his symptoms go beyond this or worsen he should be seen. Please send in medication of his choice. Please note Phenergan can cause drowsiness. Nurse to review his symptoms with him plus also the above recommendations. Notify us if any further troubles.

## 2014-11-22 NOTE — Telephone Encounter (Signed)
Pt called having diarrhea and vomiting. Started this am. Vomiting about 3 times this am and diarhea about 3 times this am. No fever, no abd pain, no blood or mucus in stool. Can something be called into [facility].

## 2014-11-22 NOTE — Telephone Encounter (Signed)
Patient would like the rx for zofran sent to the pharmacy. Rx for zofran sent electronically to pharmacy. Patient notified.

## 2014-11-24 ENCOUNTER — Ambulatory Visit (INDEPENDENT_AMBULATORY_CARE_PROVIDER_SITE_OTHER): Payer: Managed Care, Other (non HMO) | Admitting: Family Medicine

## 2014-11-24 ENCOUNTER — Encounter: Payer: Self-pay | Admitting: Family Medicine

## 2014-11-24 VITALS — BP 120/80 | Temp 98.4°F | Ht 68.0 in | Wt 165.2 lb

## 2014-11-24 DIAGNOSIS — B349 Viral infection, unspecified: Secondary | ICD-10-CM | POA: Diagnosis not present

## 2014-11-24 NOTE — Progress Notes (Signed)
   Subjective:    Patient ID: Steven Gallagher, male    DOB: 12-May-1976, 39 y.o.   MRN: [id number]  Abdominal Pain This is a new problem. The current episode started in the past 7 days. The onset quality is gradual. The problem occurs intermittently. The problem has been unchanged. The pain is located in the generalized abdominal region. The pain is moderate. The quality of the pain is aching. The abdominal pain does not radiate. Associated symptoms include headaches. Nothing aggravates the pain. The pain is relieved by nothing. He has tried nothing for the symptoms. The treatment provided no relief.   Patient states that he has no concerns at this time.   Developed major vomiting and diarrhea  tok two zofran pills called in  Then stomach and chedst started hurting  No diarrhe now,  vom times three  No fever or chills  Upper ab discomfort   Burning and cramping Review of Systems  Gastrointestinal: Positive for abdominal pain.  Neurological: Positive for headaches.       Objective:   Physical Exam Alert no acute distress. Lungs clear. Heart regular rate and rhythm. Chest wall nontender. Abdomen good bowel sounds. No discrete tenderness. No rebound no guarding.       Assessment & Plan:  Impression viral gastroenteritis with likely element of reflux. Patient encouraged to get back on his proton pump inhibitor. Symptom care discussed. WSL

## 2014-12-13 ENCOUNTER — Other Ambulatory Visit: Payer: Self-pay | Admitting: Family Medicine

## 2015-01-18 ENCOUNTER — Telehealth: Payer: Self-pay | Admitting: Family Medicine

## 2015-01-18 MED ORDER — PANTOPRAZOLE SODIUM 40 MG PO TBEC: 40.0000 mg | DELAYED_RELEASE_TABLET | Freq: Every day | ORAL | Status: DC

## 2015-01-18 NOTE — Telephone Encounter (Signed)
Medication sent to pharmacy. Patient was notified.  

## 2015-01-18 NOTE — Telephone Encounter (Signed)
pantoprazole (PROTONIX) 40 MG tablet  Pt states he needs this sent to [facility] [address]  Please or his insurance won't pay for it

## 2015-02-21 ENCOUNTER — Telehealth: Payer: Self-pay | Admitting: Family Medicine

## 2015-02-21 DIAGNOSIS — R143 Flatulence: Secondary | ICD-10-CM

## 2015-02-21 NOTE — Telephone Encounter (Signed)
Pt called requesting referral to [facility] for excessice gas and a fecal odor coming from his skin.  Gas not painful just excessive past 4-5 months   "ok" to refer or NTBS?  Please advise

## 2015-02-21 NOTE — Telephone Encounter (Signed)
Open in error

## 2015-02-21 NOTE — Telephone Encounter (Signed)
This patient has strong concerns for months that he has odor. In previous visits here I have not detected this. I am fine with him being seen by gastroenterology but I am uncertain what they can offer. Go ahead with referral reason for referral bloating/gas/flatus

## 2015-02-21 NOTE — Telephone Encounter (Signed)
Referral placed in EPIC. Patient notified. 

## 2015-02-23 NOTE — Telephone Encounter (Signed)
Open in eerror

## 2015-03-02 ENCOUNTER — Encounter: Payer: Self-pay | Admitting: Internal Medicine

## 2015-03-29 ENCOUNTER — Ambulatory Visit: Payer: Self-pay | Admitting: Gastroenterology

## 2015-04-07 ENCOUNTER — Encounter: Payer: Self-pay | Admitting: Nurse Practitioner

## 2015-04-07 ENCOUNTER — Ambulatory Visit (INDEPENDENT_AMBULATORY_CARE_PROVIDER_SITE_OTHER): Payer: Managed Care, Other (non HMO) | Admitting: Nurse Practitioner

## 2015-04-07 VITALS — BP 124/81 | HR 91 | Temp 98.0°F | Ht 68.0 in | Wt 168.0 lb

## 2015-04-07 DIAGNOSIS — R143 Flatulence: Secondary | ICD-10-CM | POA: Diagnosis not present

## 2015-04-07 NOTE — Patient Instructions (Signed)
1. Have your lab tests done when you're able. 2. Return for further evaluation in 2 months

## 2015-04-07 NOTE — Assessment & Plan Note (Signed)
39 year old male referred for excessive flatulence which is foul smelling. Also complains of a "fecal odor coming through my skin." No such odor was noted on exam. The patient is not having any red flag/warning signs or symptoms. Denies abdominal pain. Does consume artificial sweeteners and some cruciferous vegetables such as cabbage ingredients. Symptoms are chronic and been occurring for the past 1-2 years, per the patient. Potential etiologies include gluten sensitivity, pancreatic insufficiency, small intestinal bacterial overgrowth. Today we will check tissue transglutaminase IgA, total IgA, fecal pancreatic elastase, and hydrogen breath test (r/o SIBO). We'll also check general labs such as CBC and CMP. We will have him return in 2 months for follow-up.

## 2015-04-07 NOTE — Progress Notes (Signed)
cc'ed to pcp °

## 2015-04-07 NOTE — Progress Notes (Signed)
Primary Care Physician:  Sallee Lange, MD Primary Gastroenterologist:  Dr. Gala Romney  Chief Complaint  Patient presents with  . Gas    x 2 years    HPI:   39 year old male presents on referral from PCP for flatus and "a fecal odor coming from my skin." PCP note reviewed. Per nursing staff to room the patient, the patient states this is been going on for a couple years. Today he states he has had excessive fluctuance for the past couple years. States he thinks there a fecal odor coming from his skin. Denies abdominal pain, N/V, hematochezia, melena. The excessive gas is not painful. Has a history of GERD which is well controlled on current PPI. Consumes diet sodas, denies Olestra consumption. Veggies include cabbage, snap peas, and turnip greens. Has a bowel movement about once a day consistent with [facility] Scale 4 but has a sensation of incomplete emptying. Consumes minimal dairy. Denies chest pain, dyspnea, dizziness, lightheadedness, syncope, near syncope. Denies any other upper or lower GI symptoms.   Past Medical History  Diagnosis Date  . GERD (gastroesophageal reflux disease)   . Dizziness   . Hyperglycemia   . Elevated blood pressure   . Hyperlipidemia   . Hypertension     No past surgical history on file.  Current Outpatient Prescriptions  Medication Sig Dispense Refill  . pantoprazole (PROTONIX) 40 MG tablet Take 1 tablet (40 mg total) by mouth daily. 90 tablet 1   No current facility-administered medications for this visit.    Allergies as of 04/07/2015  . (No Known Allergies)    Family History  Problem Relation Age of Onset  . Hypertension Mother   . Hypertension Father     History   Social History  . Marital Status: Married    Spouse Name: N/A  . Number of Children: N/A  . Years of Education: N/A   Occupational History  . Not on file.   Social History Main Topics  . Smoking status: Never Smoker   . Smokeless tobacco: Not on file  . Alcohol Use:  Not on file  . Drug Use: Not on file  . Sexual Activity: Not on file   Other Topics Concern  . Not on file   Social History Narrative    Review of Systems: General: Negative for anorexia, weight loss, fever, chills, fatigue, weakness. Eyes: Negative for vision changes.  ENT: Negative for hoarseness, difficulty swallowing. CV: Negative for chest pain, angina, palpitations, peripheral edema.  Respiratory: Negative for dyspnea at rest, dyspnea on exertion, cough, sputum, wheezing.  GI: See history of present illness. MS: Negative for joint pain, low back pain.  Derm: Negative for rash or itching.  Endo: Negative for unusual weight change.  Heme: Negative for bruising or bleeding. Allergy: Negative for rash or hives.    Physical Exam: BP 124/81 mmHg  Pulse 91  Temp(Src) 98 F (36.7 C) (Oral)  Ht 5\' 8"  (1.727 m)  Wt 168 lb (76.204 kg)  BMI 25.55 kg/m2 General:   Alert and oriented. Pleasant and cooperative. Well-nourished and well-developed.  Head:  Normocephalic and atraumatic. Eyes:  Without icterus, sclera clear and conjunctiva pink.  Ears:  Normal auditory acuity. Cardiovascular:  S1, S2 present without murmurs appreciated. Normal pulses noted. Extremities without clubbing or edema. Respiratory:  Clear to auscultation bilaterally. No wheezes, rales, or rhonchi. No distress.  Gastrointestinal:  +BS, soft, non-tender and non-distended. No HSM noted. No guarding or rebound. No masses appreciated.  Rectal:  Deferred  Skin:  Intact without significant lesions or rashes. Neurologic:  Alert and oriented x4;  grossly normal neurologically. Psych:  Alert and cooperative. Normal mood and affect. Heme/Lymph/Immune: No excessive bruising noted.    04/07/2015 2:10 PM

## 2015-04-08 LAB — CBC WITH DIFFERENTIAL/PLATELET
Basophils Absolute: 0 10*3/uL (ref 0.0–0.1)
Basophils Relative: 0 % (ref 0–1)
Eosinophils Absolute: 0.2 10*3/uL (ref 0.0–0.7)
Eosinophils Relative: 3 % (ref 0–5)
HCT: 38.9 % — ABNORMAL LOW (ref 39.0–52.0)
Hemoglobin: 12.3 g/dL — ABNORMAL LOW (ref 13.0–17.0)
Lymphocytes Relative: 30 % (ref 12–46)
Lymphs Abs: 2 10*3/uL (ref 0.7–4.0)
MCH: 25.2 pg — ABNORMAL LOW (ref 26.0–34.0)
MCHC: 31.6 g/dL (ref 30.0–36.0)
MCV: 79.7 fL (ref 78.0–100.0)
MPV: 10.3 fL (ref 8.6–12.4)
Monocytes Absolute: 0.4 10*3/uL (ref 0.1–1.0)
Monocytes Relative: 6 % (ref 3–12)
Neutro Abs: 4.1 10*3/uL (ref 1.7–7.7)
Neutrophils Relative %: 61 % (ref 43–77)
Platelets: 250 10*3/uL (ref 150–400)
RBC: 4.88 MIL/uL (ref 4.22–5.81)
RDW: 14 % (ref 11.5–15.5)
WBC: 6.8 10*3/uL (ref 4.0–10.5)

## 2015-04-08 LAB — COMPREHENSIVE METABOLIC PANEL
ALT: 22 U/L (ref 0–53)
AST: 22 U/L (ref 0–37)
Albumin: 3.8 g/dL (ref 3.5–5.2)
Alkaline Phosphatase: 54 U/L (ref 39–117)
BUN: 17 mg/dL (ref 6–23)
CO2: 25 mEq/L (ref 19–32)
Calcium: 9.2 mg/dL (ref 8.4–10.5)
Chloride: 105 mEq/L (ref 96–112)
Creat: 0.91 mg/dL (ref 0.50–1.35)
Glucose, Bld: 77 mg/dL (ref 70–99)
Potassium: 4.2 mEq/L (ref 3.5–5.3)
Sodium: 142 mEq/L (ref 135–145)
Total Bilirubin: 0.4 mg/dL (ref 0.2–1.2)
Total Protein: 7.1 g/dL (ref 6.0–8.3)

## 2015-04-08 LAB — IGA: IgA: 175 mg/dL (ref 68–379)

## 2015-04-08 LAB — PANCREATIC ELASTASE, FECAL

## 2015-04-12 LAB — TISSUE TRANSGLUTAMINASE, IGA: Tissue Transglutaminase Ab, IgA: 1 U/mL (ref <4)

## 2015-04-13 ENCOUNTER — Other Ambulatory Visit: Payer: Self-pay

## 2015-04-13 DIAGNOSIS — R143 Flatulence: Secondary | ICD-10-CM

## 2015-04-22 ENCOUNTER — Ambulatory Visit: Admit: 2015-04-22 | Payer: Self-pay | Admitting: Internal Medicine

## 2015-04-22 ENCOUNTER — Encounter (HOSPITAL_COMMUNITY)
Admission: RE | Disposition: A | Discharge status: Home / Self Care | Payer: Self-pay | Source: Ambulatory Visit | Attending: Internal Medicine

## 2015-04-22 ENCOUNTER — Ambulatory Visit (HOSPITAL_COMMUNITY)
Admission: RE | Admit: 2015-04-22 | Discharge: 2015-04-22 | Disposition: A | Discharge status: Home / Self Care | Payer: Managed Care, Other (non HMO) | Source: Ambulatory Visit | Attending: Internal Medicine | Admitting: Internal Medicine

## 2015-04-22 DIAGNOSIS — R143 Flatulence: Secondary | ICD-10-CM | POA: Diagnosis present

## 2015-04-22 HISTORY — PX: BACTERIAL OVERGROWTH TEST: SHX5739

## 2015-04-22 SURGERY — BREATH TEST, FOR INTESTINAL BACTERIAL OVERGROWTH

## 2015-04-22 MED ORDER — LACTULOSE 10 GM/15ML PO SOLN
25.0000 g | Freq: Once | ORAL | Status: AC
  Administered 2015-04-22: 20 g via ORAL

## 2015-04-22 MED ORDER — LACTULOSE 10 GM/15ML PO SOLN
ORAL | Status: AC
  Administered 2015-04-22: 20 g via ORAL
  Filled 2015-04-22: qty 60

## 2015-04-22 NOTE — Progress Notes (Addendum)
No beans, bran or high fiber cereal the day before the procedure?YES NO:[id number]} NPO except for water 12 hours before procedure? no No smoking, sleeping or vigorous exercising for at least 30 before procedure? yes Recent antibiotic use and/or diarrhea? no   If yes, physician notified.  Time Baseline  30 mins 45 mins 60 mins 75 mins 90 mins 105 mins 120 mins 135 mins 150 mins 165 mins 180 mins  H2-ppm   2    5   4    5 10 21 29 31 30  39 39 33     Assessment: Based on current guidelines, consistent with SIBO with readings >/= 20 ppm over baseline before 120 mins.   Plan: 1. Xifaxan 550 mg tid x 14 days for SIBO 2. Follow-up OV in 4 weeks. 3. Monitor symptomatically.    Walden Field, AGNP-C Adult & Gerontological Nurse Practitioner [facility]

## 2015-04-25 ENCOUNTER — Telehealth: Payer: Self-pay | Admitting: Nurse Practitioner

## 2015-04-25 MED ORDER — RIFAXIMIN 550 MG PO TABS: 550.0000 mg | ORAL_TABLET | Freq: Three times a day (TID) | ORAL | Status: DC

## 2015-04-25 NOTE — Telephone Encounter (Signed)
Please notify the patient that his breath test suggests an overgrowth of bacteria in his small intestines. I've sent in an antibiotic prescription (Xifaxan) to help. He will take it 3 times a day for 14 days. He should come by our office to pick up a copay card to help with the prescription costs. Please stress for him to keep his follow-up appointment in 6 weeks so we can re-evaluate his symptoms.

## 2015-04-25 NOTE — Telephone Encounter (Signed)
Called pt and he was not home. Left message with family member to have pt call office

## 2015-04-26 ENCOUNTER — Encounter (HOSPITAL_COMMUNITY): Payer: Self-pay | Admitting: Internal Medicine

## 2015-04-26 NOTE — Telephone Encounter (Signed)
Pt called back to the office and is aware of results. Pt states he will be by the office to pick up a co-pay card for medication on 04/27/2015

## 2015-05-05 ENCOUNTER — Ambulatory Visit (INDEPENDENT_AMBULATORY_CARE_PROVIDER_SITE_OTHER): Payer: Managed Care, Other (non HMO) | Admitting: Family Medicine

## 2015-05-05 VITALS — Temp 99.2°F | Wt 169.0 lb

## 2015-05-05 DIAGNOSIS — R509 Fever, unspecified: Secondary | ICD-10-CM

## 2015-05-05 DIAGNOSIS — W57XXXA Bitten or stung by nonvenomous insect and other nonvenomous arthropods, initial encounter: Secondary | ICD-10-CM

## 2015-05-05 DIAGNOSIS — T148 Other injury of unspecified body region: Secondary | ICD-10-CM

## 2015-05-05 MED ORDER — DOXYCYCLINE HYCLATE 100 MG PO CAPS: 100.0000 mg | ORAL_CAPSULE | Freq: Two times a day (BID) | ORAL | Status: DC

## 2015-05-05 NOTE — Patient Instructions (Signed)
Tick Bite Information Ticks are insects that attach themselves to the skin and draw blood for food. There are various types of ticks. Common types include wood ticks and deer ticks. Most ticks live in shrubs and grassy areas. Ticks can climb onto your body when you make contact with leaves or grass where the tick is waiting. The most common places on the body for ticks to attach themselves are the scalp, neck, armpits, waist, and groin. Most tick bites are harmless, but sometimes ticks carry germs that cause diseases. These germs can be spread to a person during the tick's feeding process. The chance of a disease spreading through a tick bite depends on:   The type of tick.  Time of year.   How long the tick is attached.   Geographic location.  HOW CAN YOU PREVENT TICK BITES? Take these steps to help prevent tick bites when you are outdoors:  Wear protective clothing. Long sleeves and long pants are best.   Wear white clothes so you can see ticks more easily.  Tuck your pant legs into your socks.   If walking on a trail, stay in the middle of the trail to avoid brushing against bushes.  Avoid walking through areas with long grass.  Put insect repellent on all exposed skin and along boot tops, pant legs, and sleeve cuffs.   Check clothing, hair, and skin repeatedly and before going inside.   Brush off any ticks that are not attached.  Take a shower or bath as soon as possible after being outdoors.  WHAT IS THE PROPER WAY TO REMOVE A TICK? Ticks should be removed as soon as possible to help prevent diseases caused by tick bites. 1. If latex gloves are available, put them on before trying to remove a tick.  2. Using fine-point tweezers, grasp the tick as close to the skin as possible. You may also use curved forceps or a tick removal tool. Grasp the tick as close to its head as possible. Avoid grasping the tick on its body. 3. Pull gently with steady upward pressure until  the tick lets go. Do not twist the tick or jerk it suddenly. This may break off the tick's head or mouth parts. 4. Do not squeeze or crush the tick's body. This could force disease-carrying fluids from the tick into your body.  5. After the tick is removed, wash the bite area and your hands with soap and water or other disinfectant such as alcohol. 6. Apply a small amount of antiseptic cream or ointment to the bite site.  7. Wash and disinfect any instruments that were used.  Do not try to remove a tick by applying a hot match, petroleum jelly, or fingernail polish to the tick. These methods do not work and may increase the chances of disease being spread from the tick bite.  WHEN SHOULD YOU SEEK MEDICAL CARE? Contact your health care provider if you are unable to remove a tick from your skin or if a part of the tick breaks off and is stuck in the skin.  After a tick bite, you need to be aware of signs and symptoms that could be related to diseases spread by ticks. Contact your health care provider if you develop any of the following in the days or weeks after the tick bite:  Unexplained fever.  Rash. A circular rash that appears days or weeks after the tick bite may indicate the possibility of Lyme disease. The rash may resemble   a target with a bull's-eye and may occur at a different part of your body than the tick bite.  Redness and swelling in the area of the tick bite.   Tender, swollen lymph glands.   Diarrhea.   Weight loss.   Cough.   Fatigue.   Muscle, joint, or bone pain.   Abdominal pain.   Headache.   Lethargy or a change in your level of consciousness.  Difficulty walking or moving your legs.   Numbness in the legs.   Paralysis.  Shortness of breath.   Confusion.   Repeated vomiting.  Document Released: 08/31/2000 Document Revised: 06/24/2013 Document Reviewed: 02/11/2013 Exit[employer] Patient Information 2015 [employer]. This information is  not intended to replace advice given to you by your health care provider. Make sure you discuss any questions you have with your health care provider.  

## 2015-05-05 NOTE — Progress Notes (Signed)
   Subjective:    Patient ID: Steven Gallagher, male    DOB: 04-21-1976, 39 y.o.   MRN: [id number]  HPI Patient relates he onset over the past 48 hours of headache body aches muscle aches not feeling good low grade fever denies sore throat denies cough nausea vomiting or diarrhea. Is being treated by gastroenterology for bacterial overgrowth of the intestines. Denies mucoid stools or diarrhea. He does state he does get out in the woods a lot and did have bites in this summer   Review of Systems See above.    Objective:   Physical Exam Doesn't does not appear toxic neck is supple throat is normal mucous membranes moist lungs are clear hearts regular abdomen soft low-grade temperature noted no rash noted       Assessment & Plan:  Febrile illness with body aches headache low-grade fever summertime with history of tick exposure I don't feel the patient is toxic but I recommend doxycycline 100 mg twice a day next 10 days if progressive symptoms over the next 48 hours immediately follow-up here or go to ER no need for lab work currently

## 2015-06-08 ENCOUNTER — Encounter: Payer: Self-pay | Admitting: Nurse Practitioner

## 2015-06-08 ENCOUNTER — Ambulatory Visit (INDEPENDENT_AMBULATORY_CARE_PROVIDER_SITE_OTHER): Payer: Managed Care, Other (non HMO) | Admitting: Nurse Practitioner

## 2015-06-08 VITALS — BP 128/79 | HR 88 | Temp 98.4°F | Ht 68.0 in | Wt 169.4 lb

## 2015-06-08 DIAGNOSIS — K5900 Constipation, unspecified: Secondary | ICD-10-CM | POA: Insufficient documentation

## 2015-06-08 DIAGNOSIS — R143 Flatulence: Secondary | ICD-10-CM

## 2015-06-08 NOTE — Assessment & Plan Note (Signed)
Symptoms resolved after treatment with Xifaxan for small intestinal bacterial overgrowth. Patient is concerned about possible recurrence. I informed him to notify us if he has any recurrent symptoms and we can reevaluate him at that point.

## 2015-06-08 NOTE — Assessment & Plan Note (Signed)
Patient complains of recent onset of new constipation.. She had a bowel movement every day now has a bowel movement every 2-3 days. He's had a decreased water intake. Completed Xifaxan treatment recently as well. At this point we'll recommend he increase his water intake, add a daily stool softener, and take MiraLAX once a day if he has not had a bowel movement 2 days. Return follow-up in 3 months.

## 2015-06-08 NOTE — Patient Instructions (Signed)
1. Increase the amount of water in her diet. 2. Start taking a daily stool softener over-the-counter such as Colace. 3. If you go 2 days without a bowel movement take a dose of MiraLAX and water once a day into you have a good bowel movement. 4. Return for follow-up in 3 months.

## 2015-06-08 NOTE — Progress Notes (Signed)
CC'D TO PCP °

## 2015-06-08 NOTE — Progress Notes (Signed)
Referring Ryley Bachtel: Kathyrn Drown, MD Primary Care Physician:  Sallee Lange, MD Primary GI:  Dr. Gala Romney  Chief Complaint  Patient presents with  . Follow-up    HPI:   39 year old male presents for follow-up on flatulence. Last seen 04/07/2015 noted excessive flatus and a fecal odor coming from his skin, which have been occurring for years. GERD well-controlled on PPI. Otherwise generally asymptomatic from a GI standpoint. A hydrogen breath test was ordered and positive for small intestinal bacterial overgrowth he was given a course of Xifaxan and advised to avoid trigger foods.  Today he states his previous symptoms have resolved. Is now having new constipation. Previously went once daily, now goes ever 3-4 days. Stools are harder and sometimes requires straining. Denies hematochezia and melena. Abdominal pain abd fullness generalized which improves after having a bowel movement. States it feels like he has to go, but when he tries to go he can't do anything. Drinks minimal water in his diet, eats fruits/veggies "regularly." Denies chest pain, dyspnea, dizziness, lightheadedness, syncope, near syncope. Denies any other upper or lower GI symptoms.  Past Medical History  Diagnosis Date  . GERD (gastroesophageal reflux disease)   . Dizziness   . Hyperglycemia   . Elevated blood pressure   . Hyperlipidemia   . Hypertension     Past Surgical History  Procedure Laterality Date  . Bacterial overgrowth test N/A 04/22/2015    Procedure: BACTERIAL OVERGROWTH TEST;  Surgeon: Daneil Dolin, MD;  Location: [facility];  Service: Endoscopy;  Laterality: N/A;  0700    Current Outpatient Prescriptions  Medication Sig Dispense Refill  . aspirin-acetaminophen-caffeine (EXCEDRIN MIGRAINE) 250-250-65 MG per tablet Take 1-2 tablets by mouth every 6 (six) hours as needed for headache.     . pantoprazole (PROTONIX) 40 MG tablet Take 1 tablet (40 mg total) by mouth daily. 90 tablet 1  . doxycycline  (VIBRAMYCIN) 100 MG capsule Take 1 capsule (100 mg total) by mouth 2 (two) times daily. (Patient not taking: Reported on 06/08/2015) 20 capsule 0  . rifaximin (XIFAXAN) 550 MG TABS tablet Take 1 tablet (550 mg total) by mouth 3 (three) times daily. (Patient not taking: Reported on 06/08/2015) 42 tablet 0   No current facility-administered medications for this visit.    Allergies as of 06/08/2015  . (No Known Allergies)    Family History  Problem Relation Age of Onset  . Hypertension Mother   . Hypertension Father     Social History   Social History  . Marital Status: Married    Spouse Name: N/A  . Number of Children: N/A  . Years of Education: N/A   Social History Main Topics  . Smoking status: Never Smoker   . Smokeless tobacco: None  . Alcohol Use: None  . Drug Use: None  . Sexual Activity: Not Asked   Other Topics Concern  . None   Social History Narrative    Review of Systems: General: Negative for anorexia, weight loss, fever, chills, fatigue, weakness. CV: Negative for chest pain, angina, palpitations, dyspnea on exertion, peripheral edema.  Respiratory: Negative for dyspnea at rest, dyspnea on exertion, cough, sputum, wheezing.  GI: See history of present illness. Endo: Negative for unusual weight change.    Physical Exam: BP 128/79 mmHg  Pulse 88  Temp(Src) 98.4 F (36.9 C) (Oral)  Ht 5\' 8"  (1.727 m)  Wt 169 lb 6.4 oz (76.839 kg)  BMI 25.76 kg/m2 General:   Alert and oriented. Pleasant  and cooperative. Well-nourished and well-developed.  Head:  Normocephalic and atraumatic. Cardiovascular:  S1, S2 present without murmurs appreciated. Normal pulses noted. Extremities without clubbing or edema. Respiratory:  Clear to auscultation bilaterally. No wheezes, rales, or rhonchi. No distress.  Gastrointestinal:  +BS, soft, non-tender and non-distended. No HSM noted. No guarding or rebound. No masses appreciated.  Rectal:  Deferred  Psych:  Alert and  cooperative. Normal mood and affect.    06/08/2015 9:35 AM

## 2015-07-01 ENCOUNTER — Telehealth: Payer: Self-pay | Admitting: Family Medicine

## 2015-07-01 NOTE — Telephone Encounter (Signed)
See results folder for Labs done at patients place of employment

## 2015-07-05 ENCOUNTER — Encounter: Payer: Self-pay | Admitting: Family Medicine

## 2015-07-05 NOTE — Telephone Encounter (Signed)
Letter sent.

## 2015-07-06 NOTE — Telephone Encounter (Signed)
Called patient and informed him that letter was mailed out today. Patient verbalized understanding.

## 2015-07-17 ENCOUNTER — Encounter: Payer: Self-pay | Admitting: Family Medicine

## 2015-07-17 ENCOUNTER — Telehealth: Payer: Self-pay | Admitting: Family Medicine

## 2015-07-17 NOTE — Telephone Encounter (Signed)
Labs look good,ldl a little elevated, yearly wellness recommended via letter

## 2015-07-19 ENCOUNTER — Other Ambulatory Visit: Payer: Self-pay | Admitting: Family Medicine

## 2015-07-21 ENCOUNTER — Other Ambulatory Visit: Payer: Self-pay | Admitting: *Deleted

## 2015-07-21 MED ORDER — PANTOPRAZOLE SODIUM 40 MG PO TBEC: DELAYED_RELEASE_TABLET | ORAL | Status: DC

## 2015-07-27 ENCOUNTER — Encounter: Payer: Self-pay | Admitting: Gastroenterology

## 2015-07-27 ENCOUNTER — Ambulatory Visit (INDEPENDENT_AMBULATORY_CARE_PROVIDER_SITE_OTHER): Payer: Managed Care, Other (non HMO) | Admitting: Gastroenterology

## 2015-07-27 VITALS — BP 127/88 | HR 86 | Temp 97.3°F | Ht 68.0 in | Wt 172.4 lb

## 2015-07-27 DIAGNOSIS — R143 Flatulence: Secondary | ICD-10-CM

## 2015-07-27 NOTE — Patient Instructions (Signed)
Start taking Restora daily. This is a probiotic. Let me know if you still have significant gas in about 1-2 weeks.   I will talk with Dr. Gala Romney about further work-up.

## 2015-07-27 NOTE — Assessment & Plan Note (Signed)
Intermittent, non-painful gas that is likely associated with dietary choices. Discussed adding a probiotic and avoidance of trigger foods. No concerning signs. As of note, +bacterial overgrowth likely as documented by recent breath test, and he was treated with xifaxan. He now states he is unsure if this helped or not. He does not have typical symptoms of bacterial overgrowth currently. Hesitant to give another round of empiric antibiotics. He continues to report a foul odor with sweating only; his wife does not notice this, but people at work have noticed. Doubt any further GI work-up. As it appears others may be noticing this as well, less likely olfactory nerve component or occult issue. However, will discuss with Dr. Gala Romney any further evaluation. Patient to call in about 1-2 weeks with progress report from taking probiotic.

## 2015-07-27 NOTE — Progress Notes (Signed)
    Referring Provider: Kathyrn Drown, MD Primary Care Physician:  Sallee Lange, MD  Primary GI: Dr. Gala Romney   Chief Complaint  Patient presents with  . gas smell is coming through skin    HPI:   Steven Gallagher is a 39 y.o. male presenting today with a history of concerns for "fecal odor" emanating from skin. Chronic. Has had chronic flatulance excessively throughout the years. Underwent a hydrogen breath test which was positive for bacterial overgrowth. Prescribed Xifaxan Aug 2016. Seen in follow-up late September with resolution of "odor" he had smelled. Was dealing with constipation. Prescribed OTC agents. Not sure now if odor actually went away or not.   States odor is more noticeable when sweating. Mostly at job. States others ask him if he has passed gas but he hasn't. States if he makes quick movements, he can smell it wafting back. Frequent gas but no constipation. No rectal bleeding. Gas with greens and milk. No abdominal pain. No unexplained weight loss or lack of appetite. No N/V. Nephew gave him a hug one day and said you smell like poop. Wife states she hasn't noticed anything. Rare sinus issues but not often taking medicine. Taking Protonix once a day with good results. Saw dermatologist regarding odor without any findings. Not sure if gas got better with antibiotics.    Past Medical History  Diagnosis Date  . GERD (gastroesophageal reflux disease)   . Dizziness   . Hyperglycemia   . Elevated blood pressure   . Hyperlipidemia   . Hypertension     Past Surgical History  Procedure Laterality Date  . Bacterial overgrowth test N/A 04/22/2015    Procedure: BACTERIAL OVERGROWTH TEST;  Surgeon: Daneil Dolin, MD;  Location: [facility];  Service: Endoscopy;  Laterality: N/A;  0700    Current Outpatient Prescriptions  Medication Sig Dispense Refill  . aspirin-acetaminophen-caffeine (EXCEDRIN MIGRAINE) 250-250-65 MG per tablet Take 1-2 tablets by mouth every 6 (six) hours as  needed for headache.     . pantoprazole (PROTONIX) 40 MG tablet TAKE 1 TABLET (40 MG TOTAL) BY MOUTH DAILY. 90 tablet 0   No current facility-administered medications for this visit.    Allergies as of 07/27/2015  . (No Known Allergies)    Family History  Problem Relation Age of Onset  . Hypertension Mother   . Hypertension Father     Social History   Social History  . Marital Status: Married    Spouse Name: N/A  . Number of Children: N/A  . Years of Education: N/A   Social History Main Topics  . Smoking status: Never Smoker   . Smokeless tobacco: None  . Alcohol Use: None  . Drug Use: None  . Sexual Activity: Not Asked   Other Topics Concern  . None   Social History Narrative    Review of Systems: As mentioned in HPI   Physical Exam: BP 127/88 mmHg  Pulse 86  Temp(Src) 97.3 F (36.3 C)  Ht 5\' 8"  (1.727 m)  Wt 172 lb 6.4 oz (78.2 kg)  BMI 26.22 kg/m2 General:   Alert and oriented. No distress noted. Pleasant and cooperative.  Head:  Normocephalic and atraumatic. Abdomen:  +BS, soft, non-tender and non-distended. No rebound or guarding. No HSM or masses noted. Msk:  Symmetrical without gross deformities. Normal posture. Extremities:  Without edema. Neurologic:  Alert and  oriented x4;  grossly normal neurologically. Psych:  Alert and cooperative. Normal mood and affect.

## 2015-07-27 NOTE — Progress Notes (Signed)
cc'ed to pcp °

## 2015-09-07 ENCOUNTER — Ambulatory Visit: Payer: Managed Care, Other (non HMO) | Admitting: Nurse Practitioner

## 2015-10-03 ENCOUNTER — Encounter: Payer: Self-pay | Admitting: Nurse Practitioner

## 2015-10-03 ENCOUNTER — Ambulatory Visit (INDEPENDENT_AMBULATORY_CARE_PROVIDER_SITE_OTHER): Payer: BLUE CROSS/BLUE SHIELD | Admitting: Nurse Practitioner

## 2015-10-03 VITALS — BP 125/86 | HR 84 | Temp 97.0°F | Ht 68.0 in | Wt 173.2 lb

## 2015-10-03 DIAGNOSIS — K5900 Constipation, unspecified: Secondary | ICD-10-CM

## 2015-10-03 DIAGNOSIS — R143 Flatulence: Secondary | ICD-10-CM | POA: Diagnosis not present

## 2015-10-03 NOTE — Assessment & Plan Note (Signed)
Has had increased flatulence which she states is foul-smelling, which we've believes is related to his increased protein drinks. Denies any recurrent or ongoing fecal odor coming from his skin. Recommended he consider changing from protein powders and high protein drinks to low-fat chocolate milk for muscle recovery after the gym, especially if he is not trying to add significant amounts of bleeding muscle mass. This will help with the foul-smelling gas. Also continue Restora as it seems to be working well for him. Return for follow-up as needed.

## 2015-10-03 NOTE — Progress Notes (Signed)
cc'ed to pcp °

## 2015-10-03 NOTE — Assessment & Plan Note (Signed)
Patient with new constipation. Admits he drinks minimal water. His job has him sweating a lot as well. Should be drinking likely at least 8-10 glasses of water a day probably drinks 1-2 currently. Recommended he increase his water intake and fiber intake. Can also take MiraLAX 17 g once daily as needed if he does not have a bowel movement 2 days. Return for follow-up as needed for any recurrent or not improving symptoms.

## 2015-10-03 NOTE — Patient Instructions (Signed)
1. Continue taking Restora probiotic. I'm giving you a card to help you save on the cost. 2. Increase the amount of water to drink in a day as it seems like your not drinking enough to meet her daily needs plus the excessive sweating you do at work. 3. Increase the fiber in her diet. He can do this with increased fibrous foods, such as pecans like he discussed, or with a fiber supplement (Benefiber powder, choose, gummy fiber supplements, etc.) 4. If you're drinking enough water and consuming enough fiber and do not have a bowel movement for 2 or more days, take 17 g (1 capful or one packet) of MiraLAX once a day and to you have a good bowel movement. 5. Return for follow-up as needed for any recurrent or new stomach/colon problems.

## 2015-10-03 NOTE — Progress Notes (Signed)
Discussed with Dr. Gala Romney. Will reevaluate at next visit. Need to consider any meds/OTC products if symptoms persist. ?Mold/mildew? Further work-up as appropriate at next visit.

## 2015-10-03 NOTE — Progress Notes (Signed)
Referring Provider: Kathyrn Drown, MD Primary Care Physician:  Sallee Lange, MD Primary GI:  Dr. Gala Romney  Chief Complaint  Patient presents with  . Follow-up    HPI:   40 year old male presents for follow-up on constipation and flatulence. Has chronic issue with noticing a fecal odor particularly with sweating. Wife is unaware, but coworkers have made comments. Positive bacterial overgrowth by hydrogen breath test which is treated empirically with Xifaxan. At last follow-up on 07/27/2015 he told the provider that he is not sure if it helped and although initially he did notice a difference. At last office visit he was deemed unlikely any further GI workup but was to discuss with Dr. Gala Romney any further possible evaluation. Patient was instructed to call 1-2 weeks with progress report from taking probiotic which was prescribed at last visit. No phone notes noted in the system to indicate he did so.  Today he states he is still on the probiotic. He has not heard anyone say anything about the odor since the last visit. Is having constipation, BM once every 2-3 days. Drinks minimal to moderate amount of water (40 oz/day) and sweats a lot at work. Stools are not hard, but is having to strain with sensation of incomplete emptying. Has chronic Denies hematochezia, abdominal, N/V. Denies chest pain, dyspnea, dizziness, lightheadedness, syncope, near syncope. Denies any other upper or lower GI symptoms.  Past Medical History  Diagnosis Date  . GERD (gastroesophageal reflux disease)   . Dizziness   . Hyperglycemia   . Elevated blood pressure   . Hyperlipidemia   . Hypertension     Past Surgical History  Procedure Laterality Date  . Bacterial overgrowth test N/A 04/22/2015    Procedure: BACTERIAL OVERGROWTH TEST;  Surgeon: Daneil Dolin, MD;  Location: [facility];  Service: Endoscopy;  Laterality: N/A;  0700    Current Outpatient Prescriptions  Medication Sig Dispense Refill  .  aspirin-acetaminophen-caffeine (EXCEDRIN MIGRAINE) 250-250-65 MG per tablet Take 1-2 tablets by mouth every 6 (six) hours as needed for headache.     . pantoprazole (PROTONIX) 40 MG tablet TAKE 1 TABLET (40 MG TOTAL) BY MOUTH DAILY. 90 tablet 0   No current facility-administered medications for this visit.    Allergies as of 10/03/2015  . (No Known Allergies)    Family History  Problem Relation Age of Onset  . Hypertension Mother   . Hypertension Father     Social History   Social History  . Marital Status: Married    Spouse Name: N/A  . Number of Children: N/A  . Years of Education: N/A   Social History Main Topics  . Smoking status: Never Smoker   . Smokeless tobacco: None  . Alcohol Use: None  . Drug Use: None  . Sexual Activity: Not Asked   Other Topics Concern  . None   Social History Narrative    Review of Systems: General: Negative for anorexia, weight loss, fever, chills, fatigue, weakness. ENT: Negative for hoarseness, difficulty swallowing. CV: Negative for chest pain, angina, palpitations, peripheral edema.  Respiratory: Negative for dyspnea at rest, cough, sputum, wheezing.  GI: See history of present illness. Derm: Negative for rash or itching.  Endo: Negative for unusual weight change.  Heme: Negative for bruising or bleeding. Allergy: Negative for rash or hives.   Physical Exam: BP 125/86 mmHg  Pulse 84  Temp(Src) 97 F (36.1 C)  Ht 5\' 8"  (1.727 m)  Wt 173 lb 3.2 oz (78.563 kg)  BMI 26.34 kg/m2 General:   Alert and oriented. Pleasant and cooperative. Well-nourished and well-developed.  Head:  Normocephalic and atraumatic. Eyes:  Without icterus, sclera clear and conjunctiva pink.  Cardiovascular:  S1, S2 present without murmurs appreciated. Extremities without clubbing or edema. Respiratory:  Clear to auscultation bilaterally. No wheezes, rales, or rhonchi. No distress.  Gastrointestinal:  +BS, soft, non-tender and non-distended. No HSM  noted. No guarding or rebound. No masses appreciated.  Rectal:  Deferred  Skin:  Intact without significant lesions or rashes. Neurologic:  Alert and oriented x4;  grossly normal neurologically. Psych:  Alert and cooperative. Normal mood and affect.    10/03/2015 8:52 AM

## 2015-10-16 ENCOUNTER — Other Ambulatory Visit: Payer: Self-pay | Admitting: Family Medicine

## 2015-11-16 ENCOUNTER — Ambulatory Visit (INDEPENDENT_AMBULATORY_CARE_PROVIDER_SITE_OTHER): Payer: BLUE CROSS/BLUE SHIELD | Admitting: Family Medicine

## 2015-11-16 ENCOUNTER — Encounter: Payer: Self-pay | Admitting: Family Medicine

## 2015-11-16 VITALS — BP 120/90 | Temp 98.6°F | Ht 68.0 in | Wt 177.0 lb

## 2015-11-16 DIAGNOSIS — R030 Elevated blood-pressure reading, without diagnosis of hypertension: Secondary | ICD-10-CM

## 2015-11-16 DIAGNOSIS — R519 Headache, unspecified: Secondary | ICD-10-CM

## 2015-11-16 DIAGNOSIS — IMO0001 Reserved for inherently not codable concepts without codable children: Secondary | ICD-10-CM

## 2015-11-16 DIAGNOSIS — R51 Headache: Secondary | ICD-10-CM

## 2015-11-16 MED ORDER — TIZANIDINE HCL 4 MG PO TABS: 4.0000 mg | ORAL_TABLET | Freq: Every day | ORAL | Status: DC

## 2015-11-16 NOTE — Patient Instructions (Signed)
Try to avoid using excederin migraines ( you will need to break the habit) follow up in 3 weeks      DASH Eating Plan DASH stands for "Dietary Approaches to Stop Hypertension." The DASH eating plan is a healthy eating plan that has been shown to reduce high blood pressure (hypertension). Additional health benefits may include reducing the risk of type 2 diabetes mellitus, heart disease, and stroke. The DASH eating plan may also help with weight loss. WHAT DO I NEED TO KNOW ABOUT THE DASH EATING PLAN? For the DASH eating plan, you will follow these general guidelines:  Choose foods with a percent daily value for sodium of less than 5% (as listed on the food label).  Use salt-free seasonings or herbs instead of table salt or sea salt.  Check with your health care provider or pharmacist before using salt substitutes.  Eat lower-sodium products, often labeled as "lower sodium" or "no salt added."  Eat fresh foods.  Eat more vegetables, fruits, and low-fat dairy products.  Choose whole grains. Look for the word "whole" as the first word in the ingredient list.  Choose fish and skinless chicken or [place] more often than red meat. Limit fish, poultry, and meat to 6 oz (170 g) each day.  Limit sweets, desserts, sugars, and sugary drinks.  Choose heart-healthy fats.  Limit cheese to 1 oz (28 g) per day.  Eat more home-cooked food and less restaurant, buffet, and fast food.  Limit fried foods.  Cook foods using methods other than frying.  Limit canned vegetables. If you do use them, rinse them well to decrease the sodium.  When eating at a restaurant, ask that your food be prepared with less salt, or no salt if possible. WHAT FOODS CAN I EAT? Seek help from a dietitian for individual calorie needs. Grains Whole grain or whole wheat bread. Brown rice. Whole grain or whole wheat pasta. Quinoa, bulgur, and whole grain cereals. Low-sodium cereals. Corn or whole wheat flour  tortillas. Whole grain cornbread. Whole grain crackers. Low-sodium crackers. Vegetables Fresh or frozen vegetables (raw, steamed, roasted, or grilled). Low-sodium or reduced-sodium tomato and vegetable juices. Low-sodium or reduced-sodium tomato sauce and paste. Low-sodium or reduced-sodium canned vegetables.  Fruits All fresh, canned (in natural juice), or frozen fruits. Meat and Other Protein Products Ground beef (85% or leaner), grass-fed beef, or beef trimmed of fat. Skinless chicken or [place]. Ground chicken or [place]. Pork trimmed of fat. All fish and seafood. Eggs. Dried beans, peas, or lentils. Unsalted nuts and seeds. Unsalted canned beans. Dairy Low-fat dairy products, such as skim or 1% milk, 2% or reduced-fat cheeses, low-fat ricotta or cottage cheese, or plain low-fat yogurt. Low-sodium or reduced-sodium cheeses. Fats and Oils Tub margarines without trans fats. Light or reduced-fat mayonnaise and salad dressings (reduced sodium). Avocado. Safflower, olive, or canola oils. Natural peanut or almond butter. Other Unsalted popcorn and pretzels. The items listed above may not be a complete list of recommended foods or beverages. Contact your dietitian for more options. WHAT FOODS ARE NOT RECOMMENDED? Grains White bread. White pasta. White rice. Refined cornbread. Bagels and croissants. Crackers that contain trans fat. Vegetables Creamed or fried vegetables. Vegetables in a cheese sauce. Regular canned vegetables. Regular canned tomato sauce and paste. Regular tomato and vegetable juices. Fruits Dried fruits. Canned fruit in light or heavy syrup. Fruit juice. Meat and Other Protein Products Fatty cuts of meat. Ribs, chicken wings, bacon, sausage, bologna, salami, chitterlings, fatback, hot dogs, bratwurst, and packaged  luncheon meats. Salted nuts and seeds. Canned beans with salt. Dairy Whole or 2% milk, cream, half-and-half, and cream cheese. Whole-fat or sweetened yogurt. Full-fat  cheeses or blue cheese. Nondairy creamers and whipped toppings. Processed cheese, cheese spreads, or cheese curds. Condiments Onion and garlic salt, seasoned salt, table salt, and sea salt. Canned and packaged gravies. Worcestershire sauce. Tartar sauce. Barbecue sauce. Teriyaki sauce. Soy sauce, including reduced sodium. Steak sauce. Fish sauce. Oyster sauce. Cocktail sauce. Horseradish. Ketchup and mustard. Meat flavorings and tenderizers. Bouillon cubes. Hot sauce. Tabasco sauce. Marinades. Taco seasonings. Relishes. Fats and Oils Butter, stick margarine, lard, shortening, ghee, and bacon fat. Coconut, palm kernel, or palm oils. Regular salad dressings. Other Pickles and olives. Salted popcorn and pretzels. The items listed above may not be a complete list of foods and beverages to avoid. Contact your dietitian for more information. WHERE CAN I FIND MORE INFORMATION? National Heart, Lung, and Blood Institute: travelstabloid.com   This information is not intended to replace advice given to you by your health care provider. Make sure you discuss any questions you have with your health care provider.   Document Released: 08/23/2011 Document Revised: 09/24/2014 Document Reviewed: 07/08/2013 Elsevier Interactive Patient Education [employer].

## 2015-11-16 NOTE — Progress Notes (Signed)
   Subjective:    Patient ID: Steven Gallagher, male    DOB: 1975-09-19, 40 y.o.   MRN: [id number]  Migraine  This is a new problem. Episode onset: 2 weeks ago. The pain is located in the frontal region. The pain does not radiate. The quality of the pain is described as squeezing. Associated symptoms include photophobia. Treatments tried: excedrin migraine. The treatment provided no relief.    patient relates bilateral headaches worse on at the back of the head sometimes front of the head sometimes with photophobia none with nausea no vomiting with them does not wake him up out of his sleep. Patient relates assessment, and on over the past couple weeks about the same length of time that he is been taking supplements to try to gain weight. Also on today's visit blood pressure mildly elevated   Review of Systems  Eyes: Positive for photophobia.   patient denies any chest tightness pressure pain shortness breath nausea vomiting diarrhea denies sweats chills.     Objective:   Physical Exam  neurologic grossly normal neck no masses lungs are clear hearts regular sinus nontender  blood pressure slightly elevated we did contemplate starting antihypertensive but patient will do a better job with diet exercise and avoid creatinine [place] of blood pressure come down when he follows up within 3 weeks he'll follow-up sooner if any problems      Assessment & Plan:   migraine headaches-this could be related to his blood pressure and also could be related to stress. I gave him a migraine questionnaire along with anxiety and depression questionnaire he will fill these out and then bring those back with him when he comes back in a few weeks.  I told the patient he needed to stop taking Excedrin Migraine tablets and get away from this because he is probably getting rebound headaches.  Blood pressure borderline he will stop creatinine supplements. He may use one protein scoop per day but no other supplements. I did  encourage him to get more cardio exercise. We will recheck blood pressure when he follows up   Tylenol for headaches currently use sparingly muscle relaxer at bedtime. Follow-up within 3 weeks

## 2015-11-22 ENCOUNTER — Telehealth: Payer: Self-pay | Admitting: Family Medicine

## 2015-11-22 MED ORDER — AMLODIPINE BESYLATE 5 MG PO TABS: ORAL_TABLET | ORAL | Status: DC

## 2015-11-22 NOTE — Telephone Encounter (Signed)
Patient is still having headaches and he would like to go ahead and be put on the blood pressure medicine that him and Dr. [name] discussed on 11/16/15.   [facility] [place]

## 2015-11-22 NOTE — Telephone Encounter (Signed)
Notified patient start Amlodipine 5 mg, #30, 1/2 to 1 qd, 2 refills, instruct pt to start with 1/2 daily, keep follow up as planned. Patient verbalized understanding. Med was sent to pharmacy.

## 2015-11-22 NOTE — Telephone Encounter (Signed)
Start Amlodipine 5 mg, #30, 1/2 to 1 qd, 2 refills, instruct pt to start with 1/2 daily, keep follow up as planned

## 2015-12-07 ENCOUNTER — Encounter: Payer: Self-pay | Admitting: Family Medicine

## 2015-12-07 ENCOUNTER — Ambulatory Visit (INDEPENDENT_AMBULATORY_CARE_PROVIDER_SITE_OTHER): Payer: BLUE CROSS/BLUE SHIELD | Admitting: Family Medicine

## 2015-12-07 VITALS — BP 126/84 | Ht 68.0 in | Wt 170.6 lb

## 2015-12-07 DIAGNOSIS — R51 Headache: Secondary | ICD-10-CM

## 2015-12-07 DIAGNOSIS — R519 Headache, unspecified: Secondary | ICD-10-CM

## 2015-12-07 DIAGNOSIS — I10 Essential (primary) hypertension: Secondary | ICD-10-CM | POA: Diagnosis not present

## 2015-12-07 DIAGNOSIS — F411 Generalized anxiety disorder: Secondary | ICD-10-CM | POA: Diagnosis not present

## 2015-12-07 MED ORDER — AMLODIPINE BESYLATE 5 MG PO TABS: ORAL_TABLET | ORAL | Status: DC

## 2015-12-07 NOTE — Progress Notes (Signed)
   Subjective:    Patient ID: Steven Gallagher, male    DOB: 1976/05/16, 40 y.o.   MRN: [id number]  HPI Patient arrives for a follow up on migraine headaches. The patient is taking medication as directed 1 tablet a states he gets blood pressures are doing better he states he feels better. Patient relates he's having very few headaches he stopped taking Excedrin Migraine upon our advice he also cut back on supplements and he is only doing 1 scoop of protein powder per day he is doing cardio exercise and weightlifting every week trying to eat healthy. He is tolerating blood pressure medicine one tablet a day and states her readings are doing well. See previous note Review of Systems Occasional dull headache denies chest tightness pressure pain shortness breath nausea vomiting diarrhea    Objective:   Physical Exam Lungs clear hearts regular HEENT is benign blood pressure checked several times best reading a proximally 114/86 no elevated readings.       Assessment & Plan:  Blood pressure decent control-healthy eating recommended. Patient is doing exercise he does use 1 scoop of protein powder per day I told him no more than this. He is not taking any type of workout supplements. Patient also understands importance of getting cardiovascular exercise in.  Frequent headaches are a lot better since stopping the Excedrin migraines he is having infrequent occasional dull headaches if he starts having headaches wake him up at night blurred vision or vomiting with that he needs to follow-up here or ER  Patient will follow-up in 3 months he will bring his blood pressure monitor cuff with him he will also send [place] some readings in a few weeks

## 2015-12-21 ENCOUNTER — Telehealth: Payer: Self-pay | Admitting: Family Medicine

## 2015-12-21 NOTE — Telephone Encounter (Signed)
Left message to return call 

## 2015-12-21 NOTE — Telephone Encounter (Signed)
Saline nasal spray, OTC allergy medicine such as Claritin or Allegra, Flonase. Do not use Afrin or Neo-Synephrine or nasal decongestants

## 2015-12-21 NOTE — Telephone Encounter (Signed)
Patient take blood pressure medication and he wants to know what he can take for nose congestion?

## 2015-12-22 NOTE — Telephone Encounter (Signed)
Spoke with patient and informed her per Dr.Scott Luking-Saline nasal spray, OTC allergy medicine such as Claritin or Allegra, Flonase. Do not use Afrin or Neo-Synephrine or nasal decongestants. Patient verbalized understanding.

## 2016-01-04 ENCOUNTER — Telehealth: Payer: Self-pay | Admitting: Family Medicine

## 2016-01-04 NOTE — Telephone Encounter (Signed)
[facility] 01/04/16

## 2016-01-04 NOTE — Telephone Encounter (Signed)
Pt is calling to see if he can be added to the schedule in the next few days Or can you call in something   He is having issues with anxiety while at work, states that he gets very Sweaty and then that creates body odor. He then finds that to be  Embarrassing. Is there something you can call in for him? He states That you have treated him for anxiety before.   [facility]

## 2016-01-04 NOTE — Telephone Encounter (Signed)
Nurse's-please talk with patient. Make sure he is not depressed or suicidal. If patient is having significant depression or suicidal need to know this. As for anxiety typically low dose antidepressant pills that work at improving serotonin will help anxiety. I would recommend starting Lexapro 10 mg daily, #30, I would recommend office visit for patient early next week-when patient follows up next week weekend give additional refills on medications and discuss and greater detail.

## 2016-01-05 MED ORDER — ESCITALOPRAM OXALATE 10 MG PO TABS: 10.0000 mg | ORAL_TABLET | Freq: Every day | ORAL | Status: DC

## 2016-01-05 NOTE — Telephone Encounter (Signed)
Spoke with patient to discuss anxiety. Patient denied suicidal thoughts and any thoughts of self harm. Informed patient per Dr.Scott Luking-As for anxiety typically low dose antidepressant pills that work at improving serotonin will help anxiety. Dr.Scott would recommend starting Lexapro 10 mg daily, #30, also recommend office visit for patient early next week. Patient verbalized understanding. Medication sent into pharmacy.

## 2016-01-13 ENCOUNTER — Ambulatory Visit: Payer: BLUE CROSS/BLUE SHIELD | Admitting: Family Medicine

## 2016-01-17 ENCOUNTER — Ambulatory Visit: Payer: BLUE CROSS/BLUE SHIELD | Admitting: Family Medicine

## 2016-01-31 ENCOUNTER — Ambulatory Visit (INDEPENDENT_AMBULATORY_CARE_PROVIDER_SITE_OTHER): Payer: BLUE CROSS/BLUE SHIELD | Admitting: Family Medicine

## 2016-01-31 ENCOUNTER — Encounter: Payer: Self-pay | Admitting: Family Medicine

## 2016-01-31 VITALS — BP 118/76 | Ht 68.0 in | Wt 169.4 lb

## 2016-01-31 DIAGNOSIS — I10 Essential (primary) hypertension: Secondary | ICD-10-CM | POA: Diagnosis not present

## 2016-01-31 MED ORDER — PANTOPRAZOLE SODIUM 40 MG PO TBEC: 40.0000 mg | DELAYED_RELEASE_TABLET | Freq: Every day | ORAL | Status: DC

## 2016-01-31 MED ORDER — AMLODIPINE BESYLATE 5 MG PO TABS: ORAL_TABLET | ORAL | Status: DC

## 2016-01-31 MED ORDER — ESCITALOPRAM OXALATE 10 MG PO TABS: 10.0000 mg | ORAL_TABLET | Freq: Every day | ORAL | Status: DC

## 2016-01-31 NOTE — Progress Notes (Signed)
   Subjective:    Patient ID: Steven Gallagher, male    DOB: November 29, 1975, 40 y.o.   MRN: [id number]  HPI  Patient arrives for a follow up on lexapro. Patient states he is doing well. Patient watching his diet exercising. Taking blood pressure medicine. Denies any problems currently.  Relates stress levels anxiety levels are doing better Lexapro seems to be helping denies being depressed Review of Systems  Constitutional: Negative for fever and fatigue.  HENT: Negative for congestion.   Respiratory: Negative for cough and shortness of breath.   Cardiovascular: Negative for chest pain.  Gastrointestinal: Negative for abdominal pain.  Psychiatric/Behavioral: Negative for behavioral problems and dysphoric mood.       Objective:   Physical Exam  Constitutional: He appears well-nourished. No distress.  Cardiovascular: Normal rate, regular rhythm and normal heart sounds.   No murmur heard. Pulmonary/Chest: Effort normal and breath sounds normal. No respiratory distress.  Musculoskeletal: He exhibits no edema.  Lymphadenopathy:    He has no cervical adenopathy.  Neurological: He is alert.  Psychiatric: His behavior is normal.  Vitals reviewed.         Assessment & Plan:  Generalized anxiety disorder doing well with Lexapro stress levels under good control continue medication follow-up in 6 months  HTN good control continue current measures

## 2016-02-01 ENCOUNTER — Other Ambulatory Visit: Payer: Self-pay | Admitting: Family Medicine

## 2016-03-12 ENCOUNTER — Ambulatory Visit: Payer: BLUE CROSS/BLUE SHIELD | Admitting: Family Medicine

## 2016-04-25 ENCOUNTER — Ambulatory Visit (INDEPENDENT_AMBULATORY_CARE_PROVIDER_SITE_OTHER): Payer: BLUE CROSS/BLUE SHIELD | Admitting: Family Medicine

## 2016-04-25 ENCOUNTER — Encounter: Payer: Self-pay | Admitting: Family Medicine

## 2016-04-25 VITALS — BP 138/86 | Ht 68.0 in | Wt 176.4 lb

## 2016-04-25 DIAGNOSIS — T148 Other injury of unspecified body region: Secondary | ICD-10-CM | POA: Diagnosis not present

## 2016-04-25 DIAGNOSIS — W57XXXA Bitten or stung by nonvenomous insect and other nonvenomous arthropods, initial encounter: Secondary | ICD-10-CM | POA: Diagnosis not present

## 2016-04-25 MED ORDER — MOMETASONE FUROATE 0.1 % EX CREA: TOPICAL_CREAM | CUTANEOUS | 2 refills | Status: DC

## 2016-04-25 NOTE — Progress Notes (Signed)
   Subjective:    Patient ID: Steven Gallagher, male    DOB: 01/10/1976, 40 y.o.   MRN: [id number]  HPI Patient arrives with c/o of bug bites on legs and back side Denies fever chills sweats nausea vomiting diarrhea relates a lot of itching  Review of Systems No fever no tick bite no vomiting or diarrhea    Objective:   Physical Exam  Multiple bug bites on the right ankle also around the belt line and a couple on the arms lungs clear heart regular multiple bug bites steroid cream as directed I doubt any type of infectious process follow-up if problems      Assessment & Plan:  Warnings were discussed

## 2016-07-25 ENCOUNTER — Telehealth: Payer: Self-pay | Admitting: Family Medicine

## 2016-07-25 NOTE — Telephone Encounter (Signed)
Review blood work results faxed from [facility].

## 2016-07-26 NOTE — Telephone Encounter (Signed)
Left message on voicemail to return call.

## 2016-07-26 NOTE — Telephone Encounter (Signed)
Please let the patient know that his lab work overall looks good bad cholesterol slightly elevated. Glucose slightly elevated with evidence of prediabetes. Healthy eating regular physical activity is recommended. I do recommend a standard follow-up office visit regarding his health within the next 3 months.

## 2016-07-26 NOTE — Telephone Encounter (Signed)
Notified patient  that his lab work overall looks good bad cholesterol slightly elevated. Glucose slightly elevated with evidence of prediabetes. Healthy eating regular physical activity is recommended. Recommend a standard follow-up office visit regarding his health within the next 3 months. Patient verbalized understanding.

## 2016-07-29 ENCOUNTER — Other Ambulatory Visit: Payer: Self-pay | Admitting: Family Medicine

## 2016-08-22 ENCOUNTER — Other Ambulatory Visit: Payer: Self-pay | Admitting: Family Medicine

## 2016-08-22 NOTE — Telephone Encounter (Signed)
May give this +3 refills, needs office visit in the spring if not sooner

## 2016-10-30 ENCOUNTER — Ambulatory Visit: Payer: BLUE CROSS/BLUE SHIELD | Admitting: Family Medicine

## 2016-12-25 DIAGNOSIS — K219 Gastro-esophageal reflux disease without esophagitis: Secondary | ICD-10-CM | POA: Diagnosis not present

## 2016-12-25 DIAGNOSIS — Z136 Encounter for screening for cardiovascular disorders: Secondary | ICD-10-CM | POA: Diagnosis not present

## 2016-12-25 DIAGNOSIS — H538 Other visual disturbances: Secondary | ICD-10-CM | POA: Diagnosis not present

## 2016-12-25 DIAGNOSIS — Z01118 Encounter for examination of ears and hearing with other abnormal findings: Secondary | ICD-10-CM | POA: Diagnosis not present

## 2016-12-25 DIAGNOSIS — Z131 Encounter for screening for diabetes mellitus: Secondary | ICD-10-CM | POA: Diagnosis not present

## 2016-12-25 DIAGNOSIS — I10 Essential (primary) hypertension: Secondary | ICD-10-CM | POA: Diagnosis not present

## 2016-12-25 DIAGNOSIS — Z0000 Encounter for general adult medical examination without abnormal findings: Secondary | ICD-10-CM | POA: Diagnosis not present

## 2017-01-04 ENCOUNTER — Telehealth: Payer: Self-pay | Admitting: Family Medicine

## 2017-01-04 DIAGNOSIS — W57XXXD Bitten or stung by nonvenomous insect and other nonvenomous arthropods, subsequent encounter: Secondary | ICD-10-CM

## 2017-01-04 NOTE — Telephone Encounter (Signed)
Pt is wanting a referral to an allergist.

## 2017-01-06 NOTE — Telephone Encounter (Signed)
Patient may have referral to allergist

## 2017-01-07 ENCOUNTER — Encounter: Payer: Self-pay | Admitting: Family Medicine

## 2017-01-07 ENCOUNTER — Ambulatory Visit: Payer: BLUE CROSS/BLUE SHIELD | Admitting: Family Medicine

## 2017-01-07 NOTE — Telephone Encounter (Signed)
Referral ordered in [facility]. 

## 2017-01-13 ENCOUNTER — Other Ambulatory Visit: Payer: Self-pay | Admitting: Family Medicine

## 2017-01-18 ENCOUNTER — Ambulatory Visit (INDEPENDENT_AMBULATORY_CARE_PROVIDER_SITE_OTHER): Payer: BLUE CROSS/BLUE SHIELD | Admitting: Family Medicine

## 2017-01-18 ENCOUNTER — Encounter: Payer: Self-pay | Admitting: Family Medicine

## 2017-01-18 VITALS — BP 122/84 | Ht 68.0 in | Wt 180.0 lb

## 2017-01-18 DIAGNOSIS — R519 Headache, unspecified: Secondary | ICD-10-CM

## 2017-01-18 DIAGNOSIS — F411 Generalized anxiety disorder: Secondary | ICD-10-CM

## 2017-01-18 DIAGNOSIS — I10 Essential (primary) hypertension: Secondary | ICD-10-CM | POA: Diagnosis not present

## 2017-01-18 DIAGNOSIS — R109 Unspecified abdominal pain: Secondary | ICD-10-CM

## 2017-01-18 DIAGNOSIS — R51 Headache: Secondary | ICD-10-CM | POA: Diagnosis not present

## 2017-01-18 DIAGNOSIS — K219 Gastro-esophageal reflux disease without esophagitis: Secondary | ICD-10-CM

## 2017-01-18 DIAGNOSIS — R143 Flatulence: Secondary | ICD-10-CM | POA: Diagnosis not present

## 2017-01-18 MED ORDER — AMLODIPINE BESYLATE 5 MG PO TABS: 5.0000 mg | ORAL_TABLET | Freq: Every day | ORAL | 1 refills | Status: DC

## 2017-01-18 MED ORDER — PANTOPRAZOLE SODIUM 40 MG PO TBEC
40.0000 mg | DELAYED_RELEASE_TABLET | Freq: Every day | ORAL | 1 refills | Status: DC
Start: 2017-01-18 — End: 2017-05-13

## 2017-01-18 MED ORDER — ESCITALOPRAM OXALATE 20 MG PO TABS: 20.0000 mg | ORAL_TABLET | Freq: Every day | ORAL | 5 refills | Status: DC

## 2017-01-18 NOTE — Progress Notes (Signed)
   Subjective:    Patient ID: Steven Gallagher, male    DOB: [birth year], 41 y.o.   MRN: [id number]  Headache   This is a new problem. The current episode started 1 to 4 weeks ago. The problem occurs daily. The problem has been gradually improving. The pain is located in the frontal region. The pain does not radiate. The quality of the pain is described as throbbing. The pain is at a severity of 6/10. The pain is moderate. Associated symptoms include abdominal pain. Pertinent negatives include no anorexia, blurred vision, nausea, neck pain, numbness, visual change or vomiting. Nothing aggravates the symptoms. He has tried acetaminophen for the symptoms. The treatment provided mild relief. There is no history of cancer or cluster headaches.  Abdominal Pain  This is a new problem. The current episode started more than 1 month ago. The onset quality is gradual. The problem occurs daily. The problem has been unchanged. Quality: having gas with cramps. The abdominal pain does not radiate. Associated symptoms include headaches. Pertinent negatives include no anorexia, constipation, diarrhea, hematochezia, nausea or vomiting. Exacerbated by: by veggies and milk. The pain is relieved by passing flatus. Treatments tried: tried gas x. The treatment provided no relief.  Headaches every day. Takes tylenol. It does help.     Gas for the past two months. Has not tried any treatments.  Please see discussion above he does get abdominal cramps when he has gas denies any rectal bleeding Would like a 90 day supply of amlodipine and pantoprazole.   Review of Systems  Eyes: Negative for blurred vision.  Gastrointestinal: Positive for abdominal pain. Negative for anorexia, constipation, diarrhea, hematochezia, nausea and vomiting.  Musculoskeletal: Negative for neck pain.  Neurological: Positive for headaches. Negative for numbness.   Patient takes his blood pressure medicine on a regular basis denies any problems watch  his salt in the diet Takes his reflux medicine denies any heartburn issues Under a fair amount of stress he relates he would like to see increased amount of Lexapro to see if that will help him with his anxiousness denies being depressed    Objective:   Physical Exam  Constitutional: He appears well-nourished. No distress.  Cardiovascular: Normal rate, regular rhythm and normal heart sounds.   No murmur heard. Pulmonary/Chest: Effort normal and breath sounds normal. No respiratory distress.  Musculoskeletal: He exhibits no edema.  Lymphadenopathy:    He has no cervical adenopathy.  Neurological: He is alert.  Psychiatric: His behavior is normal.  Vitals reviewed.  Abdomen soft       Assessment & Plan:  Frequent headaches-I I don't feel that this is a sign of any type of tumor. I don't recommend MRI if not improving over the next month we may well need to do some testing patient was encouraged to reduce amount of artificial sweeteners and reduce the amount of caffeine's hopefully increase Lexapro will help as well. There are no red flags. He will give [place] an update within one month's time how he is doing  Excessive gas he was given a list of foods that produces gas. No particular medicine to help with this.  Abdominal cramps lab work ordered make sure he doesn't have gluten intolerance  Blood pressure good control continue current measures  Reflux good control continue current measures  Anxiety related issues increase medication if not seeing improvement over the next 30 days notify us  Follow-up in 6 months

## 2017-01-21 ENCOUNTER — Encounter: Payer: Self-pay | Admitting: Family Medicine

## 2017-01-21 LAB — CBC WITH DIFFERENTIAL/PLATELET
Basophils Absolute: 0 10*3/uL (ref 0.0–0.2)
Basos: 1 %
EOS (ABSOLUTE): 0.2 10*3/uL (ref 0.0–0.4)
Eos: 3 %
Hematocrit: 44 % (ref 37.5–51.0)
Hemoglobin: 14.4 g/dL (ref 13.0–17.7)
Immature Grans (Abs): 0 10*3/uL (ref 0.0–0.1)
Immature Granulocytes: 0 %
Lymphocytes Absolute: 2.2 10*3/uL (ref 0.7–3.1)
Lymphs: 36 %
MCH: 26.5 pg — ABNORMAL LOW (ref 26.6–33.0)
MCHC: 32.7 g/dL (ref 31.5–35.7)
MCV: 81 fL (ref 79–97)
Monocytes Absolute: 0.4 10*3/uL (ref 0.1–0.9)
Monocytes: 6 %
Neutrophils Absolute: 3.3 10*3/uL (ref 1.4–7.0)
Neutrophils: 54 %
Platelets: 235 10*3/uL (ref 150–379)
RBC: 5.43 x10E6/uL (ref 4.14–5.80)
RDW: 14.6 % (ref 12.3–15.4)
WBC: 6.1 10*3/uL (ref 3.4–10.8)

## 2017-01-21 LAB — BASIC METABOLIC PANEL
BUN/Creatinine Ratio: 8 — ABNORMAL LOW (ref 9–20)
BUN: 9 mg/dL (ref 6–20)
CO2: 25 mmol/L (ref 18–29)
Calcium: 9.7 mg/dL (ref 8.7–10.2)
Chloride: 98 mmol/L (ref 96–106)
Creatinine, Ser: 1.13 mg/dL (ref 0.76–1.27)
GFR calc Af Amer: 94 mL/min/{1.73_m2} (ref >59)
GFR calc non Af Amer: 81 mL/min/{1.73_m2} (ref >59)
Glucose: 93 mg/dL (ref 65–99)
Potassium: 4.3 mmol/L (ref 3.5–5.2)
Sodium: 141 mmol/L (ref 134–144)

## 2017-01-21 LAB — HEPATIC FUNCTION PANEL
ALT: 17 IU/L (ref 0–44)
AST: 19 IU/L (ref 0–40)
Albumin: 4.9 g/dL (ref 3.5–5.5)
Alkaline Phosphatase: 81 IU/L (ref 39–117)
Bilirubin Total: 0.3 mg/dL (ref 0.0–1.2)
Bilirubin, Direct: 0.08 mg/dL (ref 0.00–0.40)
Total Protein: 7.6 g/dL (ref 6.0–8.5)

## 2017-01-21 LAB — TISSUE TRANSGLUTAMINASE, IGA: Transglutaminase IgA: <2 U/mL (ref 0–3)

## 2017-01-21 LAB — LIPASE: Lipase: 51 U/L (ref 13–78)

## 2017-02-08 ENCOUNTER — Encounter: Payer: Self-pay | Admitting: Family Medicine

## 2017-02-08 ENCOUNTER — Ambulatory Visit (INDEPENDENT_AMBULATORY_CARE_PROVIDER_SITE_OTHER): Payer: BLUE CROSS/BLUE SHIELD | Admitting: Family Medicine

## 2017-02-08 ENCOUNTER — Telehealth: Payer: Self-pay | Admitting: Family Medicine

## 2017-02-08 VITALS — BP 110/80 | Temp 98.2°F | Ht 68.0 in | Wt 180.5 lb

## 2017-02-08 DIAGNOSIS — W57XXXA Bitten or stung by nonvenomous insect and other nonvenomous arthropods, initial encounter: Secondary | ICD-10-CM | POA: Diagnosis not present

## 2017-02-08 DIAGNOSIS — M542 Cervicalgia: Secondary | ICD-10-CM | POA: Diagnosis not present

## 2017-02-08 DIAGNOSIS — I10 Essential (primary) hypertension: Secondary | ICD-10-CM

## 2017-02-08 MED ORDER — AMLODIPINE BESYLATE 10 MG PO TABS: 10.0000 mg | ORAL_TABLET | Freq: Every day | ORAL | 1 refills | Status: DC

## 2017-02-08 NOTE — Telephone Encounter (Signed)
Patient said he had a sharp pain go through his back into his neck a couple of days ago and it caused him a headache and neck pain.  He said he is still having the neck pain.  He is requesting a nurse to give him a call back to discuss this.

## 2017-02-08 NOTE — Patient Instructions (Signed)
Tick Bite Information Introduction Ticks are insects that attach themselves to the skin. There are many types of ticks. Common types include wood ticks and deer ticks. Sometimes, ticks carry diseases that can make a person very ill. The most common places for ticks to attach themselves are the scalp, neck, armpits, waist, and groin. HOW CAN YOU PREVENT TICK BITES? Take these steps to help prevent tick bites when you are outdoors:  Wear long sleeves and long pants.  Wear white clothes so you can see ticks more easily.  Tuck your pant legs into your socks.  If walking on a trail, stay in the middle of the trail to avoid brushing against bushes.  Avoid walking through areas with long grass.  Put bug spray on all skin that is showing and along boot tops, pant legs, and sleeve cuffs.  Check clothes, hair, and skin often and before going inside.  Brush off any ticks that are not attached.  Take a shower or bath as soon as possible after being outdoors. HOW SHOULD YOU REMOVE A TICK? Ticks should be removed as soon as possible to help prevent diseases. 1. If latex gloves are available, put them on before trying to remove a tick. 2. Use tweezers to grasp the tick as close to the skin as possible. You may also use curved forceps or a tick removal tool. Grasp the tick as close to its head as possible. Avoid grasping the tick on its body. 3. Pull gently upward until the tick lets go. Do not twist the tick or jerk it suddenly. This may break off the tick's head or mouth parts. 4. Do not squeeze or crush the tick's body. This could force disease-carrying fluids from the tick into your body. 5. After the tick is removed, wash the bite area and your hands with soap and water or alcohol. 6. Apply a small amount of antiseptic cream or ointment to the bite site. 7. Wash any tools that were used. Do not try to remove a tick by applying a hot match, petroleum jelly, or fingernail polish to the tick. These  methods do not work. They may also increase the chances of disease being spread from the tick bite. WHEN SHOULD YOU SEEK HELP? Contact your health care provider if you are unable to remove a tick or if a part of the tick breaks off in the skin. After a tick bite, you need to watch for signs and symptoms of diseases that can be spread by ticks. Contact your health care provider if you develop any of the following:  Fever.  Rash.  Redness and puffiness (swelling) in the area of the tick bite.  Tender, puffy lymph glands.  Watery poop (diarrhea).  Weight loss.  Cough.  Feeling more tired than normal (fatigue).  Muscle, joint, or bone pain.  Belly (abdominal) pain.  Headache.  Change in your level of consciousness.  Trouble walking or moving your legs.  Loss of feeling (numbness) in the legs.  Loss of movement (paralysis).  Shortness of breath.  Confusion.  Throwing up (vomiting) many times. This information is not intended to replace advice given to you by your health care provider. Make sure you discuss any questions you have with your health care provider. Document Released: 11/28/2009 Document Revised: 02/09/2016 Document Reviewed: 02/11/2013 Elsevier Interactive Patient Education  2017 Elsevier Inc.  

## 2017-02-08 NOTE — Telephone Encounter (Signed)
Pt states sharp pain in mid back traveled up back of neck and caused a severe headache. Pt states still has a dull headache and neck pain. Consult with dr Nicki Reaper. Pt needs office visit today. Transferred to front to schedule office visit today.

## 2017-02-08 NOTE — Progress Notes (Signed)
   Subjective:    Patient ID: Steven Gallagher, male    DOB: 09/11/1976, 41 y.o.   MRN: [id number]  Headache   This is a new problem. The current episode started more than 1 year ago. Associated symptoms include neck pain.   This patient states that he was helping move a palate by pulling on it needs a sharp pain in his upper back that radiated up the neck to the back of the head causes sharp pain it's been hurting the past couple days since this happened he denies any radiation down the arms denies any true headache denies any nausea vomiting. He states is just more of a sharp discomfort from upper back through the neck into the back of his head Patient states no other concerns this visit.  Denies double vision denies vomiting denies fever did have a tick bite last week Review of Systems  Musculoskeletal: Positive for neck pain.  Neurological: Positive for headaches.       Objective:   Physical Exam Lungs are clear hearts regular pulse normal BP best reading 124/88  Mild tenderness in the upper neck good range of motion optic disks difficult to see but appear sharp pupils responsive to light Romberg negative finger to nose normal Small red area on the left lower abdomen where the tick bite occurred    Assessment & Plan:  Tick bite-information printed no antibiotic necessary Blood pressure borderline increase amlodipine 10 mg warned about swelling side effect with the legs if that occurs may need to adjust medicine or try different medicine Follow-up in 6-8 weeks to recheck pressure Musculoskeletal neck pain precipitating head pain if vomiting blurred vision or severe headache go to ER may use over-the-counter Naprosyn 2 pills twice a day to help with the discomfort should be much better in a few days if not please call us

## 2017-03-11 ENCOUNTER — Ambulatory Visit (INDEPENDENT_AMBULATORY_CARE_PROVIDER_SITE_OTHER): Payer: BLUE CROSS/BLUE SHIELD | Admitting: Family Medicine

## 2017-03-11 ENCOUNTER — Encounter: Payer: Self-pay | Admitting: Family Medicine

## 2017-03-11 VITALS — BP 124/70 | Temp 98.6°F | Ht 68.0 in | Wt 178.0 lb

## 2017-03-11 DIAGNOSIS — I10 Essential (primary) hypertension: Secondary | ICD-10-CM

## 2017-03-11 DIAGNOSIS — R42 Dizziness and giddiness: Secondary | ICD-10-CM | POA: Diagnosis not present

## 2017-03-11 MED ORDER — AMLODIPINE BESYLATE 5 MG PO TABS: 5.0000 mg | ORAL_TABLET | Freq: Every day | ORAL | 1 refills | Status: DC

## 2017-03-11 NOTE — Progress Notes (Signed)
   Subjective:    Patient ID: Steven Gallagher, male    DOB: October 23, 1975, 41 y.o.   MRN: [id number]  Dizziness  This is a new problem. Episode onset: 3 days.   Intermittent dizziness feels unsteady denies room spinning but if he makes a quick turn he does feel more sensation denies chest tightness pressure pain or headache denies nausea vomiting diarrhea fever chills sweats  Has been doing a lot of exercising taking protein powders   Review of Systems  Neurological: Positive for dizziness.   See above    Objective:   Physical Exam  No nystagmus finger to nose normal Romberg negative strength normal lungs clear heart regular orthostatics positive blood pressure 114/74 laying down 100/68 sitting 96/60 standing      Assessment & Plan:  Orthostasis Patient was encouraged to take in adequate fluids Patient encouraged to work on going with exercise may use protein powders but no more than once daily on the days he exercises Reduce blood pressure medicine down to 5 mg Follow-up within 4-6 months Follow-up sooner problems

## 2017-03-11 NOTE — Patient Instructions (Signed)
DASH Eating Plan DASH stands for "Dietary Approaches to Stop Hypertension." The DASH eating plan is a healthy eating plan that has been shown to reduce high blood pressure (hypertension). It may also reduce your risk for type 2 diabetes, heart disease, and stroke. The DASH eating plan may also help with weight loss. What are tips for following this plan? General guidelines  Avoid eating more than 2,300 mg (milligrams) of salt (sodium) a day. If you have hypertension, you may need to reduce your sodium intake to 1,500 mg a day.  Limit alcohol intake to no more than 1 drink a day for nonpregnant women and 2 drinks a day for men. One drink equals 12 oz of beer, 5 oz of wine, or 1 oz of hard liquor.  Work with your health care provider to maintain a healthy body weight or to lose weight. Ask what an ideal weight is for you.  Get at least 30 minutes of exercise that causes your heart to beat faster (aerobic exercise) most days of the week. Activities may include walking, swimming, or biking.  Work with your health care provider or diet and nutrition specialist (dietitian) to adjust your eating plan to your individual calorie needs. Reading food labels  Check food labels for the amount of sodium per serving. Choose foods with less than 5 percent of the Daily Value of sodium. Generally, foods with less than 300 mg of sodium per serving fit into this eating plan.  To find whole grains, look for the word "whole" as the first word in the ingredient list. Shopping  Buy products labeled as "low-sodium" or "no salt added."  Buy fresh foods. Avoid canned foods and premade or frozen meals. Cooking  Avoid adding salt when cooking. Use salt-free seasonings or herbs instead of table salt or sea salt. Check with your health care provider or pharmacist before using salt substitutes.  Do not fry foods. Cook foods using healthy methods such as baking, boiling, grilling, and broiling instead.  Cook with  heart-healthy oils, such as olive, canola, soybean, or sunflower oil. Meal planning   Eat a balanced diet that includes: ? 5 or more servings of fruits and vegetables each day. At each meal, try to fill half of your plate with fruits and vegetables. ? Up to 6-8 servings of whole grains each day. ? Less than 6 oz of lean meat, poultry, or fish each day. A 3-oz serving of meat is about the same size as a deck of cards. One egg equals 1 oz. ? 2 servings of low-fat dairy each day. ? A serving of nuts, seeds, or beans 5 times each week. ? Heart-healthy fats. Healthy fats called Omega-3 fatty acids are found in foods such as flaxseeds and coldwater fish, like sardines, salmon, and mackerel.  Limit how much you eat of the following: ? Canned or prepackaged foods. ? Food that is high in trans fat, such as fried foods. ? Food that is high in saturated fat, such as fatty meat. ? Sweets, desserts, sugary drinks, and other foods with added sugar. ? Full-fat dairy products.  Do not salt foods before eating.  Try to eat at least 2 vegetarian meals each week.  Eat more home-cooked food and less restaurant, buffet, and fast food.  When eating at a restaurant, ask that your food be prepared with less salt or no salt, if possible. What foods are recommended? The items listed may not be a complete list. Talk with your dietitian about what   dietary choices are best for you. Grains Whole-grain or whole-wheat bread. Whole-grain or whole-wheat pasta. Brown rice. Oatmeal. Quinoa. Bulgur. Whole-grain and low-sodium cereals. Pita bread. Low-fat, low-sodium crackers. Whole-wheat flour tortillas. Vegetables Fresh or frozen vegetables (raw, steamed, roasted, or grilled). Low-sodium or reduced-sodium tomato and vegetable juice. Low-sodium or reduced-sodium tomato sauce and tomato paste. Low-sodium or reduced-sodium canned vegetables. Fruits All fresh, dried, or frozen fruit. Canned fruit in natural juice (without  added sugar). Meat and other protein foods Skinless chicken or turkey. Ground chicken or turkey. Pork with fat trimmed off. Fish and seafood. Egg whites. Dried beans, peas, or lentils. Unsalted nuts, nut butters, and seeds. Unsalted canned beans. Lean cuts of beef with fat trimmed off. Low-sodium, lean deli meat. Dairy Low-fat (1%) or fat-free (skim) milk. Fat-free, low-fat, or reduced-fat cheeses. Nonfat, low-sodium ricotta or cottage cheese. Low-fat or nonfat yogurt. Low-fat, low-sodium cheese. Fats and oils Soft margarine without trans fats. Vegetable oil. Low-fat, reduced-fat, or light mayonnaise and salad dressings (reduced-sodium). Canola, safflower, olive, soybean, and sunflower oils. Avocado. Seasoning and other foods Herbs. Spices. Seasoning mixes without salt. Unsalted popcorn and pretzels. Fat-free sweets. What foods are not recommended? The items listed may not be a complete list. Talk with your dietitian about what dietary choices are best for you. Grains Baked goods made with fat, such as croissants, muffins, or some breads. Dry pasta or rice meal packs. Vegetables Creamed or fried vegetables. Vegetables in a cheese sauce. Regular canned vegetables (not low-sodium or reduced-sodium). Regular canned tomato sauce and paste (not low-sodium or reduced-sodium). Regular tomato and vegetable juice (not low-sodium or reduced-sodium). Pickles. Olives. Fruits Canned fruit in a light or heavy syrup. Fried fruit. Fruit in cream or butter sauce. Meat and other protein foods Fatty cuts of meat. Ribs. Fried meat. Bacon. Sausage. Bologna and other processed lunch meats. Salami. Fatback. Hotdogs. Bratwurst. Salted nuts and seeds. Canned beans with added salt. Canned or smoked fish. Whole eggs or egg yolks. Chicken or turkey with skin. Dairy Whole or 2% milk, cream, and half-and-half. Whole or full-fat cream cheese. Whole-fat or sweetened yogurt. Full-fat cheese. Nondairy creamers. Whipped toppings.  Processed cheese and cheese spreads. Fats and oils Butter. Stick margarine. Lard. Shortening. Ghee. Bacon fat. Tropical oils, such as coconut, palm kernel, or palm oil. Seasoning and other foods Salted popcorn and pretzels. Onion salt, garlic salt, seasoned salt, table salt, and sea salt. Worcestershire sauce. Tartar sauce. Barbecue sauce. Teriyaki sauce. Soy sauce, including reduced-sodium. Steak sauce. Canned and packaged gravies. Fish sauce. Oyster sauce. Cocktail sauce. Horseradish that you find on the shelf. Ketchup. Mustard. Meat flavorings and tenderizers. Bouillon cubes. Hot sauce and Tabasco sauce. Premade or packaged marinades. Premade or packaged taco seasonings. Relishes. Regular salad dressings. Where to find more information:  National Heart, Lung, and Blood Institute: www.nhlbi.nih.gov  American Heart Association: www.heart.org Summary  The DASH eating plan is a healthy eating plan that has been shown to reduce high blood pressure (hypertension). It may also reduce your risk for type 2 diabetes, heart disease, and stroke.  With the DASH eating plan, you should limit salt (sodium) intake to 2,300 mg a day. If you have hypertension, you may need to reduce your sodium intake to 1,500 mg a day.  When on the DASH eating plan, aim to eat more fresh fruits and vegetables, whole grains, lean proteins, low-fat dairy, and heart-healthy fats.  Work with your health care provider or diet and nutrition specialist (dietitian) to adjust your eating plan to your individual   calorie needs. This information is not intended to replace advice given to you by your health care provider. Make sure you discuss any questions you have with your health care provider. Document Released: 08/23/2011 Document Revised: 08/27/2016 Document Reviewed: 08/27/2016 Elsevier Interactive Patient Education  2017 Elsevier Inc.  

## 2017-03-19 ENCOUNTER — Other Ambulatory Visit: Payer: Self-pay | Admitting: Family Medicine

## 2017-04-02 ENCOUNTER — Ambulatory Visit: Payer: BLUE CROSS/BLUE SHIELD | Admitting: Family Medicine

## 2017-04-24 ENCOUNTER — Encounter: Payer: Self-pay | Admitting: Family Medicine

## 2017-04-24 ENCOUNTER — Ambulatory Visit (INDEPENDENT_AMBULATORY_CARE_PROVIDER_SITE_OTHER): Payer: BLUE CROSS/BLUE SHIELD | Admitting: Family Medicine

## 2017-04-24 VITALS — BP 132/86 | Ht 68.0 in | Wt 177.2 lb

## 2017-04-24 DIAGNOSIS — T63444A Toxic effect of venom of bees, undetermined, initial encounter: Secondary | ICD-10-CM

## 2017-04-24 NOTE — Progress Notes (Signed)
   Subjective:    Patient ID: Steven Gallagher, male    DOB: Nov 21, 1975, 41 y.o.   MRN: [id number]  HPI  Patient arrives with c/o bee sting above right eye yesterday. Patient has bee sting that occurred on the right eyelid cause some swelling. No other particular troubles. Did not have hives anywhere else did not having difficulty swallowing no difficulty breathing has never had any thing quite like this before his been stung by yellow jacket which did cause the arms swell some no anaphylactic history Review of Systems    please see above denies chest tightness pressure pain wheezing loss of consciousness Objective:   Physical Exam Lungs are clear no crackles no wheezing heart is regular pulse normal BP good no hives are noted a little bit swelling on the upper eyelid       Assessment & Plan:  Localized allergic reaction should gradually get better with Benadryl no need for EpiPen warning signs were discussed in detail  Patient has food allergies that he showed to [place] that recommend to stay way from all milk products and apples related to allergy that he says causes his skin to have a certain smell. He will get a copy of the dictation to [place] by signing a release today. A copy of the food allergies will be scanned into the system.

## 2017-05-01 ENCOUNTER — Telehealth: Payer: Self-pay | Admitting: Family Medicine

## 2017-05-01 MED ORDER — PREDNISONE 20 MG PO TABS: ORAL_TABLET | ORAL | 0 refills | Status: DC

## 2017-05-01 NOTE — Telephone Encounter (Signed)
Adult pred taper 

## 2017-05-01 NOTE — Telephone Encounter (Signed)
Patient was seen 04/24/17 by Dr Nicki Reaper for a bee sting to his eyelid that had happened the day before.

## 2017-05-01 NOTE — Telephone Encounter (Signed)
Pt called stating that he was seen the other day for a bee sting and was told to take benadryl and to call if anything changes. Pt states that his face is starting to swell again and is wanting to know if prednisone should be called in.

## 2017-05-01 NOTE — Telephone Encounter (Signed)
Patient is aware 

## 2017-05-09 ENCOUNTER — Telehealth: Payer: Self-pay | Admitting: Family Medicine

## 2017-05-09 NOTE — Telephone Encounter (Signed)
Pt dropped off an assessment from that allergy testing for Dr. Nicki Reaper to review  Pt states that Dr. Nicki Reaper was supposed to have someone here fax something somewhere  Not sure what's needed exactly  Please advise  & please call pt when done

## 2017-05-10 NOTE — Telephone Encounter (Signed)
Please see the dictation from 04/24/2017. The patient may have a copy of this dictation by signing a release. Thank you

## 2017-05-13 ENCOUNTER — Ambulatory Visit (INDEPENDENT_AMBULATORY_CARE_PROVIDER_SITE_OTHER): Payer: BLUE CROSS/BLUE SHIELD | Admitting: Family Medicine

## 2017-05-13 ENCOUNTER — Encounter: Payer: Self-pay | Admitting: Family Medicine

## 2017-05-13 VITALS — BP 110/80 | Temp 98.1°F | Ht 68.0 in | Wt 176.5 lb

## 2017-05-13 DIAGNOSIS — K2900 Acute gastritis without bleeding: Secondary | ICD-10-CM

## 2017-05-13 MED ORDER — PANTOPRAZOLE SODIUM 40 MG PO TBEC: 40.0000 mg | DELAYED_RELEASE_TABLET | Freq: Every day | ORAL | 1 refills | Status: DC

## 2017-05-13 NOTE — Progress Notes (Signed)
   Subjective:    Patient ID: Steven Gallagher, male    DOB: 1975/10/28, 41 y.o.   MRN: [id number]  Abdominal Pain  This is a new problem. The current episode started in the past 7 days. The pain is located in the epigastric region. The pain is at a severity of 7/10. The pain is moderate. The quality of the pain is aching. The abdominal pain does not radiate. Associated symptoms include flatus. Nothing aggravates the pain. The pain is relieved by nothing. He has tried proton pump inhibitors for the symptoms. The treatment provided mild relief. There is no history of abdominal surgery, pancreatitis, PUD or ulcerative colitis.   Patient states no other concerns this visit.   Review of Systems  Constitutional: Negative for activity change, appetite change and fatigue.  HENT: Negative for congestion.   Respiratory: Negative for cough.   Cardiovascular: Negative for chest pain.  Gastrointestinal: Positive for abdominal pain and flatus.  Endocrine: Negative for polydipsia and polyphagia.  Neurological: Negative for weakness.  Psychiatric/Behavioral: Negative for confusion.       Objective:   Physical Exam  Constitutional: He appears well-nourished. No distress.  Cardiovascular: Normal rate, regular rhythm and normal heart sounds.   No murmur heard. Pulmonary/Chest: Effort normal and breath sounds normal. No respiratory distress.  Abdominal: Soft. He exhibits no distension and no mass. There is no tenderness. There is no rebound and no guarding.  Musculoskeletal: He exhibits no edema.  Lymphadenopathy:    He has no cervical adenopathy.  Neurological: He is alert.  Psychiatric: His behavior is normal.  Vitals reviewed.         Assessment & Plan:  Gastritis Patient concerned that what he eats is causing his abdominal discomfort and gas Because a recent allergy testing he will avoid beef products I believe it is fine for him to have chicken He may have one cup of coffee a  day Referral to gastroenterology may need EGD Continue PPI

## 2017-05-14 ENCOUNTER — Encounter: Payer: Self-pay | Admitting: Internal Medicine

## 2017-05-14 NOTE — Progress Notes (Signed)
APPOINTMENT MADE °

## 2017-05-14 NOTE — Progress Notes (Signed)
Steven Gallagher, can you schedule this patient please?  Thanks

## 2017-05-31 ENCOUNTER — Telehealth: Payer: Self-pay | Admitting: Family Medicine

## 2017-05-31 NOTE — Telephone Encounter (Signed)
Review lab results from [facility].

## 2017-06-02 ENCOUNTER — Encounter: Payer: Self-pay | Admitting: Family Medicine

## 2017-06-02 NOTE — Telephone Encounter (Signed)
A dictated letter regarding the results of the tests was sent to the patient. They are to follow the directions on this letter and keep regular scheduled follow-up visits.

## 2017-06-06 ENCOUNTER — Other Ambulatory Visit: Payer: Self-pay

## 2017-06-06 ENCOUNTER — Ambulatory Visit (INDEPENDENT_AMBULATORY_CARE_PROVIDER_SITE_OTHER): Payer: BLUE CROSS/BLUE SHIELD | Admitting: Gastroenterology

## 2017-06-06 ENCOUNTER — Encounter: Payer: Self-pay | Admitting: Gastroenterology

## 2017-06-06 VITALS — BP 120/79 | HR 78 | Temp 97.6°F | Ht 68.0 in | Wt 176.2 lb

## 2017-06-06 DIAGNOSIS — K5900 Constipation, unspecified: Secondary | ICD-10-CM | POA: Diagnosis not present

## 2017-06-06 DIAGNOSIS — K921 Melena: Secondary | ICD-10-CM | POA: Diagnosis not present

## 2017-06-06 DIAGNOSIS — R1013 Epigastric pain: Secondary | ICD-10-CM

## 2017-06-06 DIAGNOSIS — R1310 Dysphagia, unspecified: Secondary | ICD-10-CM

## 2017-06-06 DIAGNOSIS — K625 Hemorrhage of anus and rectum: Secondary | ICD-10-CM

## 2017-06-06 MED ORDER — PEG 3350-KCL-NA BICARB-NACL 420 G PO SOLR
4000.0000 mL | ORAL | 0 refills | Status: DC
Start: 2017-06-06 — End: 2017-06-26

## 2017-06-06 NOTE — Progress Notes (Signed)
Referring Provider: Kathyrn Drown, MD Primary Care Physician:  Kathyrn Drown, MD Primary GI: Dr. Gala Romney   Chief Complaint  Patient presents with  . Abdominal Pain    HPI:   Steven Gallagher is a 41 y.o. male presenting today with a history of chronic flatulence, bacterial overgrowth, constipation, last seen in Jan 2017. At one point, he had noted others describing a fecal odor emanating from him, but this resolved in the past. Presents today at the request of Dr. Wolfgang Phoenix due to abdominal pain.   When sweating, states he is told he has a foul odor like he has passed gas but didn't. Family doesn't notice. Only noted at work. Having gas every day all day. Concerned about musty smell with sweating. Had allergy testing done recently which showed high allergic response to dairy products and a response (low) to beef.    Pain in lower abdomen for about a month. Pain scale 3 on a 1-10 scale. Intermittent. After passing gas, it is relieved. Occasional low-volume hematochezia "every blue moon". Straining. BM every other day. Pain improved after BM. Also notes intermittent epigastric pain, not every day. Improved after starting Protonix daily. No unexplained weight loss. Food feels like it gets "hung" sometimes. Aleve once or twice a week for headaches. No aspirin powders.  Protonix once every day. No GERD. No N/V. Good appetite.   Past Medical History:  Diagnosis Date  . Dizziness   . Elevated blood pressure   . GERD (gastroesophageal reflux disease)   . Hyperglycemia   . Hyperlipidemia   . Hypertension     Past Surgical History:  Procedure Laterality Date  . BACTERIAL OVERGROWTH TEST N/A 04/22/2015   Procedure: BACTERIAL OVERGROWTH TEST;  Surgeon: Daneil Dolin, MD;  Location: [facility];  Service: Endoscopy;  Laterality: N/A;  0700    Current Outpatient Prescriptions  Medication Sig Dispense Refill  . amLODipine (NORVASC) 5 MG tablet Take 1 tablet (5 mg total) by mouth daily. 90  tablet 1  . escitalopram (LEXAPRO) 20 MG tablet Take 1 tablet (20 mg total) by mouth daily. 30 tablet 5  . pantoprazole (PROTONIX) 40 MG tablet Take 1 tablet (40 mg total) by mouth daily. 90 tablet 1   No current facility-administered medications for this visit.     Allergies as of 06/06/2017  . (No Known Allergies)    Family History  Problem Relation Age of Onset  . Hypertension Mother   . Hypertension Father   . Colon cancer Neg Hx   . Colon polyps Neg Hx     Social History   Social History  . Marital status: Married    Spouse name: N/A  . Number of children: N/A  . Years of education: N/A   Social History Main Topics  . Smoking status: Never Smoker  . Smokeless tobacco: Never Used  . Alcohol use None  . Drug use: Unknown  . Sexual activity: Not Asked   Other Topics Concern  . None   Social History Narrative  . None    Review of Systems: Gen: Denies fever, chills, anorexia. Denies fatigue, weakness, weight loss.  CV: Denies chest pain, palpitations, syncope, peripheral edema, and claudication. Resp: Denies dyspnea at rest, cough, wheezing, coughing up blood, and pleurisy. GI: see HPI  Derm: Denies rash, itching, dry skin Psych: Denies depression, anxiety, memory loss, confusion. No homicidal or suicidal ideation.  Heme: Denies bruising, bleeding, and enlarged lymph nodes.  Physical Exam: BP 120/79  Pulse 78   Temp 97.6 F (36.4 C) (Oral)   Ht 5\' 8"  (1.727 m)   Wt 176 lb 3.2 oz (79.9 kg)   BMI 26.79 kg/m  General:   Alert and oriented. No distress noted. Pleasant and cooperative.  Head:  Normocephalic and atraumatic. Eyes:  Conjuctiva clear without scleral icterus. Mouth:  Oral mucosa pink and moist.  Abdomen:  +BS, soft, non-tender and non-distended. No rebound or guarding. No HSM or masses noted. Msk:  Symmetrical without gross deformities. Normal posture. Extremities:  Without edema. Neurologic:  Alert and  oriented x4 Psych:  Alert and  cooperative. Normal mood and affect.  Lab Results  Component Value Date   WBC 6.1 01/18/2017   HGB 14.4 01/18/2017   HCT 44.0 01/18/2017   MCV 81 01/18/2017   PLT 235 01/18/2017   Lab Results  Component Value Date   ALT 17 01/18/2017   AST 19 01/18/2017   ALKPHOS 81 01/18/2017   BILITOT 0.3 01/18/2017   Lab Results  Component Value Date   CREATININE 1.13 01/18/2017   BUN 9 01/18/2017   NA 141 01/18/2017   K 4.3 01/18/2017   CL 98 01/18/2017   CO2 25 01/18/2017

## 2017-06-06 NOTE — Assessment & Plan Note (Addendum)
Low-volume hematochezia in setting of constipation. No prior colonoscopy or FH of colorectal cancer or polyps. Likely benign but will pursue diagnostic colonoscopy in near future due to chronicity.   Proceed with TCS with Dr. Gala Romney in near future: the risks, benefits, and alternatives have been discussed with the patient in detail. The patient states understanding and desires to proceed. Phenergan 25 mg IV on call Start Linzess 72 mcg once daily

## 2017-06-06 NOTE — Assessment & Plan Note (Signed)
Dilation as appropriate.  

## 2017-06-06 NOTE — Progress Notes (Signed)
cc'ed to pcp °

## 2017-06-06 NOTE — Assessment & Plan Note (Addendum)
41 year old male with vague epigastric pain, history of GERD overall well controlled on Protonix daily, and vague solid food dysphagia. Endorses Aleve up to twice a week. No prior EGD. Symptoms overall vague but will pursue endoscopic evaluation at time of colonoscopy.  Proceed with upper endoscopy +/- dilation in the near future with Dr. Gala Romney. The risks, benefits, and alternatives have been discussed in detail with patient. They have stated understanding and desire to proceed.  Continue Protonix once daily Avoid NSAIDs Phenergan 25 mg IV on call

## 2017-06-06 NOTE — Patient Instructions (Addendum)
We are scheduling you for a colonoscopy, upper endoscopy, and dilatation as appropriate.   Continue Protonix once each morning, 30 minutes before breakfast. Avoid Aleve. This can irritate your stomach.   I have given you samples of a medication called Linzess for constipation. Take 30 minutes before breakfast daily. Can cause loose stool but should get better after a few days. If not, call me. We can increase this dose if needed.   We will see you 3 months!

## 2017-06-06 NOTE — Assessment & Plan Note (Signed)
Start Linzess 72 mcg once daily. Patient has interesting chronic symptoms of a "foul odor" when sweating that is noticed by his co-workers and himself. His wife does not notice this. Denies any OTC herbal supplements, vitamins. I discussed limiting sugar intake, hygiene practices, avoiding synthetic clothes. He does not have a history of diabetes or other known condition that could possibly be contributing. Unclear where to proceed from here, and dermatology has no further recommendations as per patient. Continue with procedures as planned, although this will likely not shed light on the odor he reports.

## 2017-06-12 DIAGNOSIS — L218 Other seborrheic dermatitis: Secondary | ICD-10-CM | POA: Diagnosis not present

## 2017-07-03 ENCOUNTER — Ambulatory Visit (HOSPITAL_COMMUNITY)
Admission: RE | Admit: 2017-07-03 | Discharge: 2017-07-03 | Disposition: A | Discharge status: Home / Self Care | Payer: BLUE CROSS/BLUE SHIELD | Source: Ambulatory Visit | Attending: Internal Medicine | Admitting: Internal Medicine

## 2017-07-03 ENCOUNTER — Ambulatory Visit: Payer: BLUE CROSS/BLUE SHIELD | Admitting: Nurse Practitioner

## 2017-07-03 ENCOUNTER — Encounter (HOSPITAL_COMMUNITY): Payer: Self-pay | Admitting: *Deleted

## 2017-07-03 ENCOUNTER — Encounter (HOSPITAL_COMMUNITY)
Admission: RE | Disposition: A | Discharge status: Home / Self Care | Payer: Self-pay | Source: Ambulatory Visit | Attending: Internal Medicine

## 2017-07-03 DIAGNOSIS — R1030 Lower abdominal pain, unspecified: Secondary | ICD-10-CM | POA: Diagnosis not present

## 2017-07-03 DIAGNOSIS — R1310 Dysphagia, unspecified: Secondary | ICD-10-CM | POA: Diagnosis not present

## 2017-07-03 DIAGNOSIS — Z79899 Other long term (current) drug therapy: Secondary | ICD-10-CM | POA: Diagnosis not present

## 2017-07-03 DIAGNOSIS — K640 First degree hemorrhoids: Secondary | ICD-10-CM | POA: Diagnosis not present

## 2017-07-03 DIAGNOSIS — K219 Gastro-esophageal reflux disease without esophagitis: Secondary | ICD-10-CM | POA: Diagnosis not present

## 2017-07-03 DIAGNOSIS — E785 Hyperlipidemia, unspecified: Secondary | ICD-10-CM | POA: Insufficient documentation

## 2017-07-03 DIAGNOSIS — R1013 Epigastric pain: Secondary | ICD-10-CM

## 2017-07-03 DIAGNOSIS — K5900 Constipation, unspecified: Secondary | ICD-10-CM | POA: Insufficient documentation

## 2017-07-03 DIAGNOSIS — I10 Essential (primary) hypertension: Secondary | ICD-10-CM | POA: Diagnosis not present

## 2017-07-03 DIAGNOSIS — K921 Melena: Secondary | ICD-10-CM | POA: Insufficient documentation

## 2017-07-03 DIAGNOSIS — K625 Hemorrhage of anus and rectum: Secondary | ICD-10-CM

## 2017-07-03 DIAGNOSIS — R143 Flatulence: Secondary | ICD-10-CM | POA: Insufficient documentation

## 2017-07-03 DIAGNOSIS — K449 Diaphragmatic hernia without obstruction or gangrene: Secondary | ICD-10-CM | POA: Diagnosis not present

## 2017-07-03 HISTORY — PX: ESOPHAGOGASTRODUODENOSCOPY: SHX5428

## 2017-07-03 HISTORY — PX: MALONEY DILATION: SHX5535

## 2017-07-03 HISTORY — PX: COLONOSCOPY: SHX5424

## 2017-07-03 SURGERY — EGD (ESOPHAGOGASTRODUODENOSCOPY)
Anesthesia: Moderate Sedation

## 2017-07-03 MED ORDER — ONDANSETRON HCL 4 MG/2ML IJ SOLN
INTRAMUSCULAR | Status: AC
  Filled 2017-07-03: qty 2

## 2017-07-03 MED ORDER — LIDOCAINE VISCOUS 2 % MT SOLN
OROMUCOSAL | Status: AC
  Filled 2017-07-03: qty 15

## 2017-07-03 MED ORDER — MIDAZOLAM HCL 5 MG/5ML IJ SOLN
INTRAMUSCULAR | Status: AC
  Filled 2017-07-03: qty 10

## 2017-07-03 MED ORDER — SODIUM CHLORIDE 0.9 % IV SOLN
INTRAVENOUS | Status: DC
  Administered 2017-07-03: 1000 mL via INTRAVENOUS

## 2017-07-03 MED ORDER — PROMETHAZINE HCL 25 MG/ML IJ SOLN
25.0000 mg | Freq: Once | INTRAMUSCULAR | Status: AC
  Administered 2017-07-03: 25 mg via INTRAVENOUS

## 2017-07-03 MED ORDER — SODIUM CHLORIDE 0.9% FLUSH
INTRAVENOUS | Status: AC
  Filled 2017-07-03: qty 10

## 2017-07-03 MED ORDER — MEPERIDINE HCL 100 MG/ML IJ SOLN
INTRAMUSCULAR | Status: AC
  Filled 2017-07-03: qty 2

## 2017-07-03 MED ORDER — LIDOCAINE VISCOUS 2 % MT SOLN
OROMUCOSAL | Status: DC | PRN
  Administered 2017-07-03: 1 via OROMUCOSAL

## 2017-07-03 MED ORDER — ONDANSETRON HCL 4 MG/2ML IJ SOLN
INTRAMUSCULAR | Status: DC | PRN
  Administered 2017-07-03: 4 mg via INTRAVENOUS

## 2017-07-03 MED ORDER — PROMETHAZINE HCL 25 MG/ML IJ SOLN
INTRAMUSCULAR | Status: AC
  Filled 2017-07-03: qty 1

## 2017-07-03 MED ORDER — MIDAZOLAM HCL 5 MG/5ML IJ SOLN
INTRAMUSCULAR | Status: DC | PRN
  Administered 2017-07-03: 1 mg via INTRAVENOUS
  Administered 2017-07-03: 2 mg via INTRAVENOUS
  Administered 2017-07-03: 1 mg via INTRAVENOUS
  Administered 2017-07-03: 2 mg via INTRAVENOUS

## 2017-07-03 MED ORDER — MEPERIDINE HCL 100 MG/ML IJ SOLN
INTRAMUSCULAR | Status: DC | PRN
  Administered 2017-07-03 (×2): 50 mg via INTRAVENOUS

## 2017-07-03 NOTE — Op Note (Signed)
[facility] Patient Name: Steven Gallagher Procedure Date: 07/03/2017 9:43 AM MRN: [id number] Date of Birth: 31-May-1976 Attending MD: Norvel Richards , MD CSN: [id number] Age: 41 Admit Type: Outpatient Procedure:                Colonoscopy Indications:              Hematochezia Providers:                Norvel Richards, MD, Selena Lesser, Aram Candela Referring MD:              Medicines:                Midazolam 6 mg IV, Meperidine 100 mg IV,                            Ondansetron 4 mg IV, Promethazine 25 mg IV Complications:            No immediate complications. Estimated Blood Loss:     Estimated blood loss: none. Procedure:                Pre-Anesthesia Assessment:                           - Prior to the procedure, a History and Physical                            was performed, and patient medications and                            allergies were reviewed. The patient's tolerance of                            previous anesthesia was also reviewed. The risks                            and benefits of the procedure and the sedation                            options and risks were discussed with the patient.                            All questions were answered, and informed consent                            was obtained. Prior Anticoagulants: The patient has                            taken no previous anticoagulant or antiplatelet                            agents. ASA Grade Assessment: II - A patient with  mild systemic disease. After reviewing the risks                            and benefits, the patient was deemed in                            satisfactory condition to undergo the procedure.                           After obtaining informed consent, the colonoscope                            was passed under direct vision. Throughout the                            procedure, the patient's blood pressure,  pulse, and                            oxygen saturations were monitored continuously. The                            [id number] ([id number]) scope was introduced through                            the anus and advanced to the the cecum, identified                            by appendiceal orifice and ileocecal valve. The                            colonoscopy was performed without difficulty. The                            patient tolerated the procedure well. The quality                            of the bowel preparation was adequate. The entire                            colon was well visualized. The ileocecal valve,                            appendiceal orifice, and rectum were photographed.                            The quality of the bowel preparation was adequate. Scope In: 10:13:52 AM Scope Out: 10:27:59 AM Scope Withdrawal Time: 0 hours 7 minutes 5 seconds  Total Procedure Duration: 0 hours 14 minutes 7 seconds  Findings:      The perianal and digital rectal examinations were normal.      The exam was otherwise without abnormality on direct and retroflexion       views.      Non-bleeding internal hemorrhoids were found during retroflexion. The       hemorrhoids were  mild, small and Grade I (internal hemorrhoids that do       not prolapse). Impression:               - The examination was otherwise normal on direct                            and retroflexion views.                           - Non-bleeding internal hemorrhoids.                           - No specimens collected. Moderate Sedation:      Moderate (conscious) sedation was administered by the endoscopy nurse       and supervised by the endoscopist. The following parameters were       monitored: oxygen saturation, heart rate, blood pressure, respiratory       rate, EKG, adequacy of pulmonary ventilation, and response to care.       Total physician intraservice time was 35 minutes. Recommendation:           - Patient  has a contact number available for                            emergencies. The signs and symptoms of potential                            delayed complications were discussed with the                            patient. Return to normal activities tomorrow.                            Written discharge instructions were provided to the                            patient.                           - Resume previous diet.                           - Continue present medications.                           - Repeat colonoscopy in 10 years for screening                            purposes.                           - Return to GI clinic in 2 months. See EGD report. Procedure Code(s):        --- Professional ---                           [phone], Colonoscopy, flexible; diagnostic, including  collection of specimen(s) by brushing or washing,                            when performed (separate procedure)                           99152, Moderate sedation services provided by the                            same physician or other qualified health care                            professional performing the diagnostic or                            therapeutic service that the sedation supports,                            requiring the presence of an independent trained                            observer to assist in the monitoring of the                            patient's level of consciousness and physiological                            status; initial 15 minutes of intraservice time,                            patient age 89 years or older                           [phone], Moderate sedation services; each additional                            15 minutes intraservice time Diagnosis Code(s):        --- Professional ---                           K64.0, First degree hemorrhoids                           K92.1, Melena (includes Hematochezia) CPT copyright 2016 American Medical  Association. All rights reserved. The codes documented in this report are preliminary and upon coder review may  be revised to meet current compliance requirements. Cristopher Estimable. Tilford Deaton, MD Norvel Richards, MD 07/03/2017 10:33:31 AM This report has been signed electronically. Number of Addenda: 0

## 2017-07-03 NOTE — Discharge Instructions (Signed)
°Colonoscopy °Discharge Instructions ° °Read the instructions outlined below and refer to this sheet in the next few weeks. These discharge instructions provide you with general information on caring for yourself after you leave the hospital. Your doctor may also give you specific instructions. While your treatment has been planned according to the most current medical practices available, unavoidable complications occasionally occur. If you have any problems or questions after discharge, call Dr. Rourk at [phone]. °ACTIVITY °· You may resume your regular activity, but move at a slower pace for the next 24 hours.  °· Take frequent rest periods for the next 24 hours.  °· Walking will help get rid of the air and reduce the bloated feeling in your belly (abdomen).  °· No driving for 24 hours (because of the medicine (anesthesia) used during the test).   °· Do not sign any important legal documents or operate any machinery for 24 hours (because of the anesthesia used during the test).  °NUTRITION °· Drink plenty of fluids.  °· You may resume your normal diet as instructed by your doctor.  °· Begin with a light meal and progress to your normal diet. Heavy or fried foods are harder to digest and may make you feel sick to your stomach (nauseated).  °· Avoid alcoholic beverages for 24 hours or as instructed.  °MEDICATIONS °· You may resume your normal medications unless your doctor tells you otherwise.  °WHAT YOU CAN EXPECT TODAY °· Some feelings of bloating in the abdomen.  °· Passage of more gas than usual.  °· Spotting of blood in your stool or on the toilet paper.  °IF YOU HAD POLYPS REMOVED DURING THE COLONOSCOPY: °· No aspirin products for 7 days or as instructed.  °· No alcohol for 7 days or as instructed.  °· Eat a soft diet for the next 24 hours.  °FINDING OUT THE RESULTS OF YOUR TEST °Not all test results are available during your visit. If your test results are not back during the visit, make an appointment  with your caregiver to find out the results. Do not assume everything is normal if you have not heard from your caregiver or the medical facility. It is important for you to follow up on all of your test results.  °SEEK IMMEDIATE MEDICAL ATTENTION IF: °· You have more than a spotting of blood in your stool.  °· Your belly is swollen (abdominal distention).  °· You are nauseated or vomiting.  °· You have a temperature over 101.  °· You have abdominal pain or discomfort that is severe or gets worse throughout the day.  °EGD °Discharge instructions °Please read the instructions outlined below and refer to this sheet in the next few weeks. These discharge instructions provide you with general information on caring for yourself after you leave the hospital. Your doctor may also give you specific instructions. While your treatment has been planned according to the most current medical practices available, unavoidable complications occasionally occur. If you have any problems or questions after discharge, please call your doctor. °ACTIVITY °· You may resume your regular activity but move at a slower pace for the next 24 hours.  °· Take frequent rest periods for the next 24 hours.  °· Walking will help expel (get rid of) the air and reduce the bloated feeling in your abdomen.  °· No driving for 24 hours (because of the anesthesia (medicine) used during the test).  °· You may shower.  °· Do not sign any important   legal documents or operate any machinery for 24 hours (because of the anesthesia used during the test).  NUTRITION  Drink plenty of fluids.   You may resume your normal diet.   Begin with a light meal and progress to your normal diet.   Avoid alcoholic beverages for 24 hours or as instructed by your caregiver.  MEDICATIONS  You may resume your normal medications unless your caregiver tells you otherwise.  WHAT YOU CAN EXPECT TODAY  You may experience abdominal discomfort such as a feeling of fullness  or gas pains.  FOLLOW-UP  Your doctor will discuss the results of your test with you.  SEEK IMMEDIATE MEDICAL ATTENTION IF ANY OF THE FOLLOWING OCCUR:  Excessive nausea (feeling sick to your stomach) and/or vomiting.   Severe abdominal pain and distention (swelling).   Trouble swallowing.   Temperature over 101 F (37.8 C).   Rectal bleeding or vomiting of blood.    Continue Protonix 40 mg daily  Take Linzess 72 daily for constipation as previously recommended  Hemorrhoid information provided  Repeat colonoscopy in 10 years for screening  Take align or digestive advantage probiotic for gas/odor symptoms  Office visit with [place] in 2 months.

## 2017-07-03 NOTE — Op Note (Signed)
[facility] Patient Name: Steven Gallagher Procedure Date: 07/03/2017 9:34 AM MRN: [id number] Date of Birth: Mar 14, 1976 Attending MD: Norvel Richards , MD CSN: [id number] Age: 41 Admit Type: Outpatient Procedure:                Upper GI endoscopy Indications:              Dysphagia Providers:                Norvel Richards, MD, Selena Lesser, Aram Candela Referring MD:              Medicines:                Midazolam 6 mg IV, Meperidine 100 mg IV,                            Promethazine 25 mg IV, Ondansetron 4 mg IV Complications:            No immediate complications. Estimated Blood Loss:     Estimated blood loss: none. Procedure:                Pre-Anesthesia Assessment:                           - Prior to the procedure, a History and Physical                            was performed, and patient medications and                            allergies were reviewed. The patient's tolerance of                            previous anesthesia was also reviewed. The risks                            and benefits of the procedure and the sedation                            options and risks were discussed with the patient.                            All questions were answered, and informed consent                            was obtained. Prior Anticoagulants: The patient has                            taken no previous anticoagulant or antiplatelet                            agents. ASA Grade Assessment: II - A patient with  mild systemic disease. After reviewing the risks                            and benefits, the patient was deemed in                            satisfactory condition to undergo the procedure.                           After obtaining informed consent, the endoscope was                            passed under direct vision. Throughout the                            procedure, the patient's blood  pressure, pulse, and                            oxygen saturations were monitored continuously. The                            [id number] ([id number]) scope was introduced through the                            mouth, and advanced to the second part of duodenum.                            The upper GI endoscopy was accomplished without                            difficulty. The patient tolerated the procedure                            well. Scope In: 10:02:55 AM Scope Out: 10:07:45 AM Total Procedure Duration: 0 hours 4 minutes 50 seconds  Findings:      The examined esophagus was normal. The scope was withdrawn. Dilation was       performed with a Maloney dilator with no resistance at 56 Fr. The       dilation site was examined following endoscope reinsertion and showed no       change. Estimated blood loss: none.      A small hiatal hernia was present.      The duodenal bulb and second portion of the duodenum were normal. Impression:               - Normal esophagus. Dilated.                           - Small hiatal hernia.                           - Normal duodenal bulb and second portion of the                            duodenum.                           -  No specimens collected. Moderate Sedation:      Moderate (conscious) sedation was administered by the endoscopy nurse       and supervised by the endoscopist. The following parameters were       monitored: oxygen saturation, heart rate, blood pressure, respiratory       rate, EKG, adequacy of pulmonary ventilation, and response to care.       Total physician intraservice time was 15 minutes. Recommendation:           - Patient has a contact number available for                            emergencies. The signs and symptoms of potential                            delayed complications were discussed with the                            patient. Return to normal activities tomorrow.                            Written discharge  instructions were provided to the                            patient.                           - Resume previous diet.                           - Continue present medications.                           - No repeat upper endoscopy.                           - Return to GI office (date not yet determined).                            See colonoscopy report. Procedure Code(s):        --- Professional ---                           [phone], Esophagogastroduodenoscopy, flexible,                            transoral; diagnostic, including collection of                            specimen(s) by brushing or washing, when performed                            (separate procedure)                           95093, Dilation of esophagus, by unguided sound or  bougie, single or multiple passes                           99152, Moderate sedation services provided by the                            same physician or other qualified health care                            professional performing the diagnostic or                            therapeutic service that the sedation supports,                            requiring the presence of an independent trained                            observer to assist in the monitoring of the                            patient's level of consciousness and physiological                            status; initial 15 minutes of intraservice time,                            patient age 4 years or older Diagnosis Code(s):        --- Professional ---                           K44.9, Diaphragmatic hernia without obstruction or                            gangrene                           R13.10, Dysphagia, unspecified CPT copyright 2016 American Medical Association. All rights reserved. The codes documented in this report are preliminary and upon coder review may  be revised to meet current compliance requirements. Cristopher Estimable. Devinn Hurwitz, MD Norvel Richards, MD 07/03/2017 10:12:40 AM This report has been signed electronically. Number of Addenda: 0

## 2017-07-03 NOTE — H&P (View-Only) (Signed)
Referring Provider: Kathyrn Drown, MD Primary Care Physician:  Kathyrn Drown, MD Primary GI: Dr. Gala Romney   Chief Complaint  Patient presents with  . Abdominal Pain    HPI:   Steven Gallagher is a 41 y.o. male presenting today with a history of chronic flatulence, bacterial overgrowth, constipation, last seen in Jan 2017. At one point, he had noted others describing a fecal odor emanating from him, but this resolved in the past. Presents today at the request of Dr. Wolfgang Phoenix due to abdominal pain.   When sweating, states he is told he has a foul odor like he has passed gas but didn't. Family doesn't notice. Only noted at work. Having gas every day all day. Concerned about musty smell with sweating. Had allergy testing done recently which showed high allergic response to dairy products and a response (low) to beef.    Pain in lower abdomen for about a month. Pain scale 3 on a 1-10 scale. Intermittent. After passing gas, it is relieved. Occasional low-volume hematochezia "every blue moon". Straining. BM every other day. Pain improved after BM. Also notes intermittent epigastric pain, not every day. Improved after starting Protonix daily. No unexplained weight loss. Food feels like it gets "hung" sometimes. Aleve once or twice a week for headaches. No aspirin powders.  Protonix once every day. No GERD. No N/V. Good appetite.   Past Medical History:  Diagnosis Date  . Dizziness   . Elevated blood pressure   . GERD (gastroesophageal reflux disease)   . Hyperglycemia   . Hyperlipidemia   . Hypertension     Past Surgical History:  Procedure Laterality Date  . BACTERIAL OVERGROWTH TEST N/A 04/22/2015   Procedure: BACTERIAL OVERGROWTH TEST;  Surgeon: Daneil Dolin, MD;  Location: [facility];  Service: Endoscopy;  Laterality: N/A;  0700    Current Outpatient Prescriptions  Medication Sig Dispense Refill  . amLODipine (NORVASC) 5 MG tablet Take 1 tablet (5 mg total) by mouth daily. 90  tablet 1  . escitalopram (LEXAPRO) 20 MG tablet Take 1 tablet (20 mg total) by mouth daily. 30 tablet 5  . pantoprazole (PROTONIX) 40 MG tablet Take 1 tablet (40 mg total) by mouth daily. 90 tablet 1   No current facility-administered medications for this visit.     Allergies as of 06/06/2017  . (No Known Allergies)    Family History  Problem Relation Age of Onset  . Hypertension Mother   . Hypertension Father   . Colon cancer Neg Hx   . Colon polyps Neg Hx     Social History   Social History  . Marital status: Married    Spouse name: N/A  . Number of children: N/A  . Years of education: N/A   Social History Main Topics  . Smoking status: Never Smoker  . Smokeless tobacco: Never Used  . Alcohol use None  . Drug use: Unknown  . Sexual activity: Not Asked   Other Topics Concern  . None   Social History Narrative  . None    Review of Systems: Gen: Denies fever, chills, anorexia. Denies fatigue, weakness, weight loss.  CV: Denies chest pain, palpitations, syncope, peripheral edema, and claudication. Resp: Denies dyspnea at rest, cough, wheezing, coughing up blood, and pleurisy. GI: see HPI  Derm: Denies rash, itching, dry skin Psych: Denies depression, anxiety, memory loss, confusion. No homicidal or suicidal ideation.  Heme: Denies bruising, bleeding, and enlarged lymph nodes.  Physical Exam: BP 120/79  Pulse 78   Temp 97.6 F (36.4 C) (Oral)   Ht 5\' 8"  (1.727 m)   Wt 176 lb 3.2 oz (79.9 kg)   BMI 26.79 kg/m  General:   Alert and oriented. No distress noted. Pleasant and cooperative.  Head:  Normocephalic and atraumatic. Eyes:  Conjuctiva clear without scleral icterus. Mouth:  Oral mucosa pink and moist.  Abdomen:  +BS, soft, non-tender and non-distended. No rebound or guarding. No HSM or masses noted. Msk:  Symmetrical without gross deformities. Normal posture. Extremities:  Without edema. Neurologic:  Alert and  oriented x4 Psych:  Alert and  cooperative. Normal mood and affect.  Lab Results  Component Value Date   WBC 6.1 01/18/2017   HGB 14.4 01/18/2017   HCT 44.0 01/18/2017   MCV 81 01/18/2017   PLT 235 01/18/2017   Lab Results  Component Value Date   ALT 17 01/18/2017   AST 19 01/18/2017   ALKPHOS 81 01/18/2017   BILITOT 0.3 01/18/2017   Lab Results  Component Value Date   CREATININE 1.13 01/18/2017   BUN 9 01/18/2017   NA 141 01/18/2017   K 4.3 01/18/2017   CL 98 01/18/2017   CO2 25 01/18/2017

## 2017-07-03 NOTE — Interval H&P Note (Signed)
History and Physical Interval Note:  07/03/2017 9:42 AM  Steven Gallagher  has presented today for surgery, with the diagnosis of RECTAL BLEEDING/DYSPHAGIA/DYSPEPSIA  The various methods of treatment have been discussed with the patient and family. After consideration of risks, benefits and other options for treatment, the patient has consented to  Procedure(s) with comments: ESOPHAGOGASTRODUODENOSCOPY (EGD) (N/A) - 930 COLONOSCOPY (N/A) MALONEY DILATION (N/A) as a surgical intervention .  The patient's history has been reviewed, patient examined, no change in status, stable for surgery.  I have reviewed the patient's chart and labs.  Questions were answered to the patient's satisfaction.     Steven Gallagher  No change. Has not tried Linzess  as of yet. EGD with ED and colonoscopy  per plan.  The risks, benefits, limitations, imponderables and alternatives regarding both EGD and colonoscopy have been reviewed with the patient. Questions have been answered. All parties agreeable.

## 2017-07-04 ENCOUNTER — Telehealth: Payer: Self-pay

## 2017-07-04 NOTE — Telephone Encounter (Addendum)
Received VM today @ 12:19 pm, pt called with questions concerning his procedure. Returned call @ 12:19PM, Left VM and waiting on pt to return call.   Pt called back and needed to end call due to his child crying in the back ground. Waiting for pt to call back and discuss his pt summary of care after his procedure.   Pt returned call and wanted to know what he should do for an odor issue he is have. Pt asked to review his summary of care given yesterday after his procedure and to start probiotic to help with odor as discussed with Dr.Rourk.

## 2017-07-08 ENCOUNTER — Encounter (HOSPITAL_COMMUNITY): Payer: Self-pay | Admitting: Internal Medicine

## 2017-07-27 ENCOUNTER — Other Ambulatory Visit: Payer: Self-pay | Admitting: Family Medicine

## 2017-07-29 NOTE — Telephone Encounter (Signed)
Last seen 02/08/17 for a medication check up

## 2017-07-29 NOTE — Telephone Encounter (Signed)
This +2 refills patient needs follow-up office visit

## 2017-08-19 ENCOUNTER — Ambulatory Visit: Payer: BLUE CROSS/BLUE SHIELD | Admitting: Family Medicine

## 2017-08-23 ENCOUNTER — Encounter (HOSPITAL_COMMUNITY): Payer: Self-pay | Admitting: Emergency Medicine

## 2017-08-23 ENCOUNTER — Other Ambulatory Visit: Payer: Self-pay

## 2017-08-23 ENCOUNTER — Emergency Department (HOSPITAL_COMMUNITY)
Admission: EM | Admit: 2017-08-23 | Discharge: 2017-08-23 | Disposition: A | Discharge status: Home / Self Care | Payer: BLUE CROSS/BLUE SHIELD | Attending: Emergency Medicine | Admitting: Emergency Medicine

## 2017-08-23 ENCOUNTER — Emergency Department (HOSPITAL_COMMUNITY): Payer: BLUE CROSS/BLUE SHIELD

## 2017-08-23 ENCOUNTER — Telehealth: Payer: Self-pay

## 2017-08-23 DIAGNOSIS — I10 Essential (primary) hypertension: Secondary | ICD-10-CM | POA: Insufficient documentation

## 2017-08-23 DIAGNOSIS — R072 Precordial pain: Secondary | ICD-10-CM | POA: Diagnosis not present

## 2017-08-23 DIAGNOSIS — R110 Nausea: Secondary | ICD-10-CM | POA: Diagnosis not present

## 2017-08-23 DIAGNOSIS — R079 Chest pain, unspecified: Secondary | ICD-10-CM | POA: Diagnosis not present

## 2017-08-23 DIAGNOSIS — Z79899 Other long term (current) drug therapy: Secondary | ICD-10-CM | POA: Diagnosis not present

## 2017-08-23 LAB — CBC
HCT: 46 % (ref 39.0–52.0)
Hemoglobin: 14.2 g/dL (ref 13.0–17.0)
MCH: 25.7 pg — ABNORMAL LOW (ref 26.0–34.0)
MCHC: 30.9 g/dL (ref 30.0–36.0)
MCV: 83.3 fL (ref 78.0–100.0)
Platelets: 241 10*3/uL (ref 150–400)
RBC: 5.52 MIL/uL (ref 4.22–5.81)
RDW: 13.1 % (ref 11.5–15.5)
WBC: 6.8 10*3/uL (ref 4.0–10.5)

## 2017-08-23 LAB — BASIC METABOLIC PANEL
Anion gap: 10 (ref 5–15)
BUN: 14 mg/dL (ref 6–20)
CO2: 27 mmol/L (ref 22–32)
Calcium: 9.5 mg/dL (ref 8.9–10.3)
Chloride: 100 mmol/L — ABNORMAL LOW (ref 101–111)
Creatinine, Ser: 1.07 mg/dL (ref 0.61–1.24)
GFR calc Af Amer: >60 mL/min (ref >60)
GFR calc non Af Amer: >60 mL/min (ref >60)
Glucose, Bld: 94 mg/dL (ref 65–99)
Potassium: 3.9 mmol/L (ref 3.5–5.1)
Sodium: 137 mmol/L (ref 135–145)

## 2017-08-23 LAB — TROPONIN I
Troponin I: <0.03 ng/mL (ref <0.03)
Troponin I: <0.03 ng/mL (ref <0.03)

## 2017-08-23 MED ORDER — NAPROXEN 500 MG PO TABS: 500.0000 mg | ORAL_TABLET | Freq: Two times a day (BID) | ORAL | 0 refills | Status: DC

## 2017-08-23 NOTE — ED Triage Notes (Signed)
Pt reports was lifting weights at the gym and reports chest tightness for last several days. Pt reports intermittent nausea as well. Pt reports pain is worse with deep breath and movement. nad noted.

## 2017-08-23 NOTE — Discharge Instructions (Signed)
Follow-up with your primary doctor for recheck or return to ER for any worsening symptoms

## 2017-08-23 NOTE — Telephone Encounter (Signed)
Pt called states his chest feels funny. He states it is tight no arm or neck pain. I recommend to go to the ed. He states understanding.

## 2017-08-23 NOTE — ED Provider Notes (Signed)
[facility] EMERGENCY DEPARTMENT Provider Note   CSN: [id number] Arrival date & time: 08/23/17  1620     History   Chief Complaint Chief Complaint  Patient presents with  . Chest Injury    HPI [name] is a 41 y.o. male.  HPI  Steven Gallagher is a 41 y.o. male who presents to the Emergency Department complaining of chest pain tightness for several days.  He describes the pain is "aching" and associated with nausea.  States his pain began after lifting weights and is now associated with certain movements.  Symptoms are not associated with diaphoresis neck or jaw pain.  He denies shortness of breath, cough, fever or chills.  No previous cardiac history.  Denies any drug use.  Also denies any pain or numbness of the upper extremities.    Past Medical History:  Diagnosis Date  . Dizziness   . Elevated blood pressure   . GERD (gastroesophageal reflux disease)   . Hyperglycemia   . Hyperlipidemia   . Hypertension     Patient Active Problem List   Diagnosis Date Noted  . Dyspepsia 06/06/2017  . Dysphagia 06/06/2017  . Hematochezia 06/06/2017  . HTN (hypertension) 12/07/2015  . Generalized anxiety disorder 12/07/2015  . Constipation 06/08/2015  . Flatulence 04/07/2015  . Hyperhidrosis 08/22/2014  . Anxiety state, unspecified 02/11/2014  . Prediabetes 07/28/2013  . Esophageal reflux 01/27/2013    Past Surgical History:  Procedure Laterality Date  . BACTERIAL OVERGROWTH TEST N/A 04/22/2015   Procedure: BACTERIAL OVERGROWTH TEST;  Surgeon: Daneil Dolin, MD;  Location: [facility];  Service: Endoscopy;  Laterality: N/A;  0700  . COLONOSCOPY N/A 07/03/2017   Procedure: COLONOSCOPY;  Surgeon: Daneil Dolin, MD;  Location: [facility];  Service: Endoscopy;  Laterality: N/A;  . ESOPHAGOGASTRODUODENOSCOPY N/A 07/03/2017   Procedure: ESOPHAGOGASTRODUODENOSCOPY (EGD);  Surgeon: Daneil Dolin, MD;  Location: [facility];  Service: Endoscopy;  Laterality: N/A;  930    . MALONEY DILATION N/A 07/03/2017   Procedure: Venia Minks DILATION;  Surgeon: Daneil Dolin, MD;  Location: [facility];  Service: Endoscopy;  Laterality: N/A;       Home Medications    Prior to Admission medications   Medication Sig Start Date End Date Taking? Authorizing Provider  amLODipine (NORVASC) 5 MG tablet Take 1 tablet (5 mg total) by mouth daily. 03/11/17   Kathyrn Drown, MD  escitalopram (LEXAPRO) 20 MG tablet TAKE 1 TABLET BY MOUTH EVERY DAY 07/29/17   Kathyrn Drown, MD  pantoprazole (PROTONIX) 40 MG tablet Take 1 tablet (40 mg total) by mouth daily. 05/13/17   Kathyrn Drown, MD    Family History Family History  Problem Relation Age of Onset  . Hypertension Mother   . Hypertension Father   . Hypertension Brother   . Colon cancer Neg Hx   . Colon polyps Neg Hx     Social History Social History   Tobacco Use  . Smoking status: Never Smoker  . Smokeless tobacco: Never Used  Substance Use Topics  . Alcohol use: No  . Drug use: No     Allergies   Patient has no known allergies.   Review of Systems Review of Systems  Constitutional: Negative for diaphoresis and fever.  Respiratory: Positive for chest tightness. Negative for shortness of breath and wheezing.   Cardiovascular: Positive for chest pain.  Gastrointestinal: Positive for nausea. Negative for abdominal pain and vomiting.  Musculoskeletal: Negative for arthralgias  and neck pain.  Skin: Negative for rash.  Neurological: Negative for syncope, weakness, numbness and headaches.     Physical Exam Updated Vital Signs BP (!) 140/98 (BP Location: Right Arm)   Pulse 94   Temp 98.8 F (37.1 C) (Oral)   Resp 18   Ht 5\' 8"  (1.727 m)   Wt 83.9 kg (185 lb)   SpO2 98%   BMI 28.13 kg/m   Physical Exam  Constitutional: He is oriented to person, place, and time. He appears well-developed and well-nourished. No distress.  HENT:  Head: Atraumatic.  Neck: Normal range of motion.   Cardiovascular: Normal rate, regular rhythm and intact distal pulses.  No murmur heard. Pulmonary/Chest: Effort normal. He exhibits tenderness.  Abdominal: Soft. There is no tenderness. There is no guarding.  Musculoskeletal: Normal range of motion. He exhibits no tenderness.  Neurological: He is alert and oriented to person, place, and time. No sensory deficit.  Skin: Skin is warm. Capillary refill takes less than 2 seconds. No rash noted.  Psychiatric: He has a normal mood and affect.  Nursing note and vitals reviewed.    ED Treatments / Results  Labs (all labs ordered are listed, but only abnormal results are displayed) Labs Reviewed  BASIC METABOLIC PANEL - Abnormal; Notable for the following components:      Result Value   Chloride 100 (*)    All other components within normal limits  CBC - Abnormal; Notable for the following components:   MCH 25.7 (*)    All other components within normal limits  TROPONIN I  TROPONIN I    EKG  EKG Interpretation  Date/Time:  Friday August 23 2017 16:29:08 EST Ventricular Rate:  90 PR Interval:  130 QRS Duration: 74 QT Interval:  354 QTC Calculation: 433 R Axis:   45 Text Interpretation:  Normal sinus rhythm Normal ECG no prior EKG no acute ischemic changes  Confirmed by Brantley Stage [id number]) on 08/23/2017 7:59:22 PM       Radiology Dg Chest 2 View  Result Date: 08/23/2017 CLINICAL DATA:  Chest pain after lifting weights 2 days ago. EXAM: CHEST  2 VIEW COMPARISON:  07/03/2010 FINDINGS: The heart size and mediastinal contours are within normal limits. Both lungs are clear. The visualized skeletal structures are unremarkable. IMPRESSION: No active cardiopulmonary disease. Electronically Signed   By: Ashley Royalty M.D.   On: 08/23/2017 16:47    Procedures Procedures (including critical care time)  Medications Ordered in ED Medications - No data to display   Initial Impression / Assessment and Plan / ED Course  I have reviewed  the triage vital signs and the nursing notes.  Pertinent labs & imaging results that were available during my care of the patient were reviewed by me and considered in my medical decision making (see chart for details).     Patient is well-appearing.  Vitals stable.  Labs, EKG, initial and delta troponin within normal limits.  No tachycardia dyspnea or hypoxia to suggest PE.  Low clinical suspicion for cardiac process.  Patient appears stable for discharge agrees to close PCP follow-up and return precautions were discussed.  Final Clinical Impressions(s) / ED Diagnoses   Final diagnoses:  Precordial pain    ED Discharge Orders    None       Kem Parkinson, PA-C 08/25/17 1338    Virgel Manifold, MD 08/25/17 1610

## 2017-09-03 ENCOUNTER — Other Ambulatory Visit: Payer: Self-pay | Admitting: Family Medicine

## 2017-09-03 NOTE — Telephone Encounter (Signed)
Patient may have this +1 more refill needs office visit

## 2017-09-05 ENCOUNTER — Ambulatory Visit: Payer: BLUE CROSS/BLUE SHIELD | Admitting: Gastroenterology

## 2017-12-03 ENCOUNTER — Other Ambulatory Visit: Payer: Self-pay | Admitting: *Deleted

## 2017-12-03 MED ORDER — ESCITALOPRAM OXALATE 20 MG PO TABS: 20.0000 mg | ORAL_TABLET | Freq: Every day | ORAL | 0 refills | Status: DC

## 2017-12-03 NOTE — Telephone Encounter (Signed)
Ok but will need ov in summer

## 2018-01-19 ENCOUNTER — Other Ambulatory Visit: Payer: Self-pay | Admitting: Family Medicine

## 2018-01-20 ENCOUNTER — Encounter: Payer: Self-pay | Admitting: Internal Medicine

## 2018-01-20 ENCOUNTER — Telehealth: Payer: Self-pay | Admitting: Gastroenterology

## 2018-01-20 ENCOUNTER — Ambulatory Visit: Payer: BLUE CROSS/BLUE SHIELD | Admitting: Gastroenterology

## 2018-01-20 NOTE — Telephone Encounter (Signed)
PATIENT WAS A NO SHOW AND LETTER SENT  °

## 2018-02-05 ENCOUNTER — Ambulatory Visit: Payer: BLUE CROSS/BLUE SHIELD | Admitting: Family Medicine

## 2018-02-05 VITALS — BP 112/80 | Ht 68.0 in | Wt 177.4 lb

## 2018-02-05 DIAGNOSIS — K2900 Acute gastritis without bleeding: Secondary | ICD-10-CM

## 2018-02-05 MED ORDER — ONDANSETRON 4 MG PO TBDP: 4.0000 mg | ORAL_TABLET | Freq: Three times a day (TID) | ORAL | 0 refills | Status: DC | PRN

## 2018-02-05 MED ORDER — SUCRALFATE 1 G PO TABS: ORAL_TABLET | ORAL | 0 refills | Status: DC

## 2018-02-05 NOTE — Progress Notes (Signed)
   Subjective:    Patient ID: Steven Gallagher, male    DOB: [birth year], 42 y.o.   MRN: [id number]  HPI Patient arrives with a flare of reflux that is causing stomach pain and nausea.  About two weeks ago flared up  Does no smoke and does not drink alcohol   does not take a lot of anti inflam meds, occas aleavel The review of records reveals patient gastritis off and on down through the years   Most painful in the upper mid adomen   Taking the protonix every day  Review of Systems No headache, no major weight loss or weight gain, no chest pain no back pain abdominal pain no change in bowel habits complete ROS otherwise negative     Objective:   Physical Exam Alert vitals stable, NAD. Blood pressure good on repeat. HEENT normal. Lungs clear. Heart regular rate and rhythm. Distinct epigastric tenderness.  No rebound no guarding no masses excellent bowel impression       Assessment & Plan:  1 impression gastritis.  Discussed.  Of note a year ago patient required upper endoscopy with esophageal dilatation.  No evidence of dysphasia just upper abdominal discomfort despite compliance with Protonix will add Carafate 1 g before meals for the next month.  Zofran as needed for nausea.  If persists recommend call for follow-up

## 2018-02-10 ENCOUNTER — Other Ambulatory Visit: Payer: Self-pay | Admitting: Family Medicine

## 2018-02-27 ENCOUNTER — Encounter: Payer: Self-pay | Admitting: Family Medicine

## 2018-02-27 ENCOUNTER — Ambulatory Visit: Payer: BLUE CROSS/BLUE SHIELD | Admitting: Family Medicine

## 2018-02-27 VITALS — BP 118/84 | Ht 68.0 in | Wt 178.2 lb

## 2018-02-27 DIAGNOSIS — I10 Essential (primary) hypertension: Secondary | ICD-10-CM | POA: Diagnosis not present

## 2018-02-27 MED ORDER — AMLODIPINE BESYLATE 5 MG PO TABS: 5.0000 mg | ORAL_TABLET | Freq: Every day | ORAL | 1 refills | Status: DC

## 2018-02-27 NOTE — Patient Instructions (Signed)

## 2018-02-27 NOTE — Progress Notes (Signed)
   Subjective:    Patient ID: Steven Gallagher, male    DOB: 1976/02/02, 42 y.o.   MRN: [id number]  Medication Refill  Pertinent negatives include no abdominal pain, chest pain, congestion, coughing, fatigue, fever, headaches, nausea or weakness.   Pt here today for refill on Amlodipine 5mg . Needs to be 90 day supply Patient for blood pressure check up. Patient relates compliance with meds. Todays BP reviewed with the patient. Patient denies issues with medication. Patient relates reasonable diet. Patient tries to minimize salt. Patient aware of BP goals.   Review of Systems  Constitutional: Negative for activity change, fatigue and fever.  HENT: Negative for congestion and rhinorrhea.   Respiratory: Negative for cough and shortness of breath.   Cardiovascular: Negative for chest pain and leg swelling.  Gastrointestinal: Negative for abdominal pain, diarrhea and nausea.  Genitourinary: Negative for dysuria and hematuria.  Neurological: Negative for weakness and headaches.  Psychiatric/Behavioral: Negative for behavioral problems.       Objective:   Physical Exam  Vital signs were reviewed.  GEN-no acute distress not toxic HEENT- external ears appear normal the eyes appear normal without discharge head is atraumatic Neck-no lymphadenopathy or masses are felt no tracheal deviation Chest- CTA respiratory rate is normal no crackles no rhonchi Cardiovascular- heart is regular rate is normal no murmurs detected Extremities skin warm dry no significant edema Abdomen soft no guarding rebound or tenderness Neuro-alert and oriented, no unilateral numbness or weakness detected       Assessment & Plan:  HTN- Patient was seen today as part of a visit regarding hypertension. The importance of healthy diet and regular physical activity was discussed. The importance of compliance with medications discussed.  Ideal goal is to keep blood pressure low elevated levels certainly below 449/67 when  possible.  The patient was counseled that keeping blood pressure under control lessen his risk of complications.  The importance of regular follow-ups was discussed with the patient.  Low-salt diet such as DASH recommended.  Regular physical activity was recommended as well.  Patient was advised to keep regular follow-ups.  The patient was seen today for GERD. Patient benefits from medication. Patient to continue medication. Keep all regular follow ups.

## 2018-03-06 ENCOUNTER — Other Ambulatory Visit: Payer: Self-pay | Admitting: Family Medicine

## 2018-03-24 ENCOUNTER — Telehealth: Payer: Self-pay | Admitting: Family Medicine

## 2018-03-24 NOTE — Telephone Encounter (Signed)
good

## 2018-03-24 NOTE — Telephone Encounter (Signed)
rx was wrote for 30 days with no refills. Office visit note states to take for one month. Pt states he is not having any reflux symptoms at this time. Pt advised he was only suppose to take for one month and to call back if having any issues. Pt verbalized understanding.

## 2018-03-24 NOTE — Telephone Encounter (Signed)
Patient said [facility] in [place] called him to see if he needs a refill on sucralfate (CARAFATE) 1 g tablet. Patient said he didn't request and doesn't want to take if doctor says he doesn't need to anymore. Does he need refill?

## 2018-04-13 ENCOUNTER — Other Ambulatory Visit: Payer: Self-pay | Admitting: Family Medicine

## 2018-05-12 ENCOUNTER — Encounter: Payer: Self-pay | Admitting: Family Medicine

## 2018-05-12 ENCOUNTER — Ambulatory Visit: Payer: BLUE CROSS/BLUE SHIELD | Admitting: Family Medicine

## 2018-05-12 VITALS — BP 124/76 | Ht 68.0 in | Wt 184.2 lb

## 2018-05-12 DIAGNOSIS — K2900 Acute gastritis without bleeding: Secondary | ICD-10-CM

## 2018-05-12 MED ORDER — AMLODIPINE BESYLATE 5 MG PO TABS: 5.0000 mg | ORAL_TABLET | Freq: Every day | ORAL | 1 refills | Status: DC

## 2018-05-12 NOTE — Progress Notes (Signed)
   Subjective:    Patient ID: Steven Gallagher, male    DOB: 12-31-1975, 42 y.o.   MRN: [id number]  Abdominal Pain  This is a recurrent problem. The current episode started more than 1 month ago. The onset quality is gradual. The problem occurs 2 to 4 times per day. The problem has been waxing and waning. The pain is located in the epigastric region. The pain is at a severity of 6/10. The pain is moderate. The quality of the pain is burning. The abdominal pain does not radiate. Pertinent negatives include no diarrhea, dysuria, fever, headaches, hematuria or nausea. The pain is aggravated by certain positions. The pain is relieved by belching. He has tried proton pump inhibitors for the symptoms. There is no history of colon cancer.    Patient arrives with ongoing reflux-see 5/19 and 8/18 - was doing some better with Carafate but stopped med because he didn't know how long he was to take it. Patient has been doing a lot of protein shakes and didn't know if that was making it worse.  Review of Systems  Constitutional: Negative for activity change, fatigue and fever.  HENT: Negative for congestion and rhinorrhea.   Respiratory: Negative for cough and shortness of breath.   Cardiovascular: Negative for chest pain and leg swelling.  Gastrointestinal: Negative for abdominal pain, diarrhea and nausea.  Genitourinary: Negative for dysuria and hematuria.  Neurological: Negative for weakness and headaches.  Psychiatric/Behavioral: Negative for agitation and behavioral problems.       Objective:   Physical Exam  Constitutional: He appears well-nourished. No distress.  HENT:  Head: Normocephalic and atraumatic.  Eyes: Right eye exhibits no discharge. Left eye exhibits no discharge.  Neck: No tracheal deviation present.  Cardiovascular: Normal rate, regular rhythm and normal heart sounds.  No murmur heard. Pulmonary/Chest: Effort normal and breath sounds normal. No respiratory distress.    Musculoskeletal: He exhibits no edema.  Lymphadenopathy:    He has no cervical adenopathy.  Neurological: He is alert. Coordination normal.  Skin: Skin is warm and dry.  Psychiatric: He has a normal mood and affect. His behavior is normal.  Vitals reviewed.   No tenderness on today's exam  Patient complains of odor I do not appreciate any odor on today's exam- this has been a reoccurring issue for the patient    Assessment & Plan:  Very nice patient Probable reflux Continue PPI Recommend adding Carafate Bland diet Minimize tomato based products mints  peppermint etc. avoid caffeine some chocolates If patient does not see significant improvement over the next 2 weeks he needs to let us know in the next that would be gastroenterology with EGD May use probiotic Patient does a lot of exercise minimize creatine and muscle mass builders lab work before next visit

## 2018-05-23 ENCOUNTER — Telehealth: Payer: Self-pay | Admitting: Internal Medicine

## 2018-05-23 ENCOUNTER — Telehealth: Payer: Self-pay

## 2018-05-23 NOTE — Telephone Encounter (Signed)
Opened in error

## 2018-05-23 NOTE — Telephone Encounter (Signed)
Pt called asking for the nurse. [phone]

## 2018-05-23 NOTE — Telephone Encounter (Signed)
He needs a face to face evaluation prior to any recommendations. Hasn't been seen in 1 year. Fecal seepage likely secondary to internal hemorrhoids. We could attempt a banding and see if this is helpful. Would also be concerned about something else going on if odor is ONLY coming from rectum and needs physical evaluation.   Needs an appt.

## 2018-05-23 NOTE — Telephone Encounter (Signed)
Spoke with pt and he is concerned with the odor coming from his rectum when he sits down at work. Pt feels he's not having problems with constipation or hemrrhoids. Pt has noticed some leakage from rectum when he wipes. He has tried wiping with an antibacterial baby wipe as needed at work, washing with Cetaphil body wash and safeguard soap with no improvement. Pt does have gas but the odor that comes from his rectum is different when sitting down. Pt has an appointment in 08/2018 but would like some advise prior to his apt.

## 2018-05-26 ENCOUNTER — Telehealth: Payer: Self-pay | Admitting: Family Medicine

## 2018-05-26 MED ORDER — ESCITALOPRAM OXALATE 20 MG PO TABS: ORAL_TABLET | ORAL | 3 refills | Status: DC

## 2018-05-26 NOTE — Telephone Encounter (Signed)
Lexapro 20 mg #30 with 3 refills First week half tablet daily After that 1 tablet daily Follow-up office visit within 3 months to see how this is doing Follow-up sooner problems

## 2018-05-26 NOTE — Telephone Encounter (Signed)
Patient is aware medication sent to drugstore requested.

## 2018-05-26 NOTE — Telephone Encounter (Signed)
Pt notified. Please put pt on a cancellation list with [facility]. Pt may possibly need hemorrhoid banding.

## 2018-05-26 NOTE — Telephone Encounter (Signed)
Patient wanting new prescription with more mg for anxiety. He was taking [facility]ro [facility] 

## 2018-05-26 NOTE — Telephone Encounter (Signed)
I called and left a message with his wife to have pt to r/c.

## 2018-05-26 NOTE — Telephone Encounter (Signed)
PATIENT ON A CANCELLATION LIST, HIS UPCOMING APPOINTMENT IS SCHEDULED WITH LESLIE LEWIS

## 2018-05-26 NOTE — Telephone Encounter (Addendum)
Patient was on Lexapro 10mg  but stopped medication because he was feeling better and would like to restart medication -possibly at a higher dose

## 2018-06-02 ENCOUNTER — Telehealth: Payer: Self-pay | Admitting: Family Medicine

## 2018-06-02 DIAGNOSIS — K2900 Acute gastritis without bleeding: Secondary | ICD-10-CM

## 2018-06-02 NOTE — Telephone Encounter (Signed)
Pt calling requesting a referral to [facility] - Gastroenterology  Pt states needs referral for internal hemorrhoids  Please advise - refer or NTBS?    If refer - please initiate referral in system so that I may process  (Per pt - call @ [phone] *& OK to [facility] if no answer)

## 2018-06-02 NOTE — Telephone Encounter (Signed)
Referral ordered in Epic. Patient notified. 

## 2018-06-02 NOTE — Telephone Encounter (Signed)
Please go ahead with referral thank you 

## 2018-06-03 ENCOUNTER — Telehealth: Payer: Self-pay | Admitting: Family Medicine

## 2018-06-03 NOTE — Telephone Encounter (Signed)
Patient would like to speak with a nurse. He has something unspecified going on and would like to know if it would be something Dr.Scott could take care of or would he need to go to gastro

## 2018-06-03 NOTE — Telephone Encounter (Signed)
Patient is aware and will keep the appointment for tomorrow. Manuela Schwartz at [facility] is aware and will cancel the Dec appt.

## 2018-06-03 NOTE — Telephone Encounter (Signed)
Gastro [phone]

## 2018-06-03 NOTE — Telephone Encounter (Signed)
I called Steven Gallagher and they can see the pt tomorrow 06/04/2018 at 1:30pm with Steven Gallagher. Manuela Schwartz at [facility] wants a call back on whether or not he will be able to make the appt tomorrow so she can cancel the Dec appt. I called the pt home # spouse states he is at work. Called the cell # left a message that very important to call us back to confirm so we can notify Gastro.

## 2018-06-03 NOTE — Telephone Encounter (Signed)
Left message to return call to get more info.  

## 2018-06-03 NOTE — Telephone Encounter (Signed)
Pt would like to know if Dr. Nicki Reaper could handle internal hemorrhoids  We are currently working on a GI referral to [facility] (no need for a new referral to be placed)

## 2018-06-03 NOTE — Telephone Encounter (Signed)
Primarily he needs an office visit with gastroenterology.  This is something that is more within their domain-please assist this patient in seeing if they can give him office visit.  If the patient feels that he needs to be seen by [place] we will be willing to see him- (not sure if we can necessarily take care of the problem that is why I recommend gastroenterology)

## 2018-06-03 NOTE — Telephone Encounter (Signed)
Pt not sure if he has a hemorroid or not. Tried preparation h and anifungal cream. Neither are helping. He is having brown leakage and odor from rectum. No pain, no fever. States it started about 2 weeks ago. Has not heard back from GI about appt.

## 2018-06-04 ENCOUNTER — Ambulatory Visit (INDEPENDENT_AMBULATORY_CARE_PROVIDER_SITE_OTHER): Payer: BLUE CROSS/BLUE SHIELD | Admitting: Gastroenterology

## 2018-06-04 ENCOUNTER — Encounter: Payer: Self-pay | Admitting: Gastroenterology

## 2018-06-04 VITALS — BP 130/89 | HR 86 | Temp 97.7°F | Ht 68.0 in | Wt 180.8 lb

## 2018-06-04 DIAGNOSIS — L290 Pruritus ani: Secondary | ICD-10-CM | POA: Insufficient documentation

## 2018-06-04 DIAGNOSIS — R159 Full incontinence of feces: Secondary | ICD-10-CM | POA: Insufficient documentation

## 2018-06-04 DIAGNOSIS — K5900 Constipation, unspecified: Secondary | ICD-10-CM | POA: Diagnosis not present

## 2018-06-04 NOTE — Patient Instructions (Addendum)
To help with more productive bowel movements, start taking Amitiza 1 gelcap twice a day with food. It is important to take with food to avoid nausea. Let me know how this works for you!  Add supplemental fiber every day such as Metamucil or Benefiber.   Avoid straining. Do not sit on the toilet for more than a few minutes at a time.  I will see you back for hemorrhoid banding.  It was a pleasure to see you today. I strive to create trusting relationships with patients to provide genuine, compassionate, and quality care. I value your feedback. If you receive a survey regarding your visit,  I greatly appreciate you taking time to fill this out.   [name], PhD, ANP-BC [facility]    Hemorrhoids Hemorrhoids are swollen veins in and around the rectum or anus. There are two types of hemorrhoids:  Internal hemorrhoids. These occur in the veins that are just inside the rectum. They may poke through to the outside and become irritated and painful.  External hemorrhoids. These occur in the veins that are outside of the anus and can be felt as a painful swelling or hard lump near the anus.  Most hemorrhoids do not cause serious problems, and they can be managed with home treatments such as diet and lifestyle changes. If home treatments do not help your symptoms, procedures can be done to shrink or remove the hemorrhoids. What are the causes? This condition is caused by increased pressure in the anal area. This pressure may result from various things, including:  Constipation.  Straining to have a bowel movement.  Diarrhea.  Pregnancy.  Obesity.  Sitting for long periods of time.  Heavy lifting or other activity that causes you to strain.  Anal sex.  What are the signs or symptoms? Symptoms of this condition include:  Pain.  Anal itching or irritation.  Rectal bleeding.  Leakage of stool (feces).  Anal swelling.  One or more lumps around the  anus.  How is this diagnosed? This condition can often be diagnosed through a visual exam. Other exams or tests may also be done, such as:  Examination of the rectal area with a gloved hand (digital rectal exam).  Examination of the anal canal using a small tube (anoscope).  A blood test, if you have lost a significant amount of blood.  A test to look inside the colon (sigmoidoscopy or colonoscopy).  How is this treated? This condition can usually be treated at home. However, various procedures may be done if dietary changes, lifestyle changes, and other home treatments do not help your symptoms. These procedures can help make the hemorrhoids smaller or remove them completely. Some of these procedures involve surgery, and others do not. Common procedures include:  Rubber band ligation. Rubber bands are placed at the base of the hemorrhoids to cut off the blood supply to them.  Sclerotherapy. Medicine is injected into the hemorrhoids to shrink them.  Infrared coagulation. A type of light energy is used to get rid of the hemorrhoids.  Hemorrhoidectomy surgery. The hemorrhoids are surgically removed, and the veins that supply them are tied off.  Stapled hemorrhoidopexy surgery. A circular stapling device is used to remove the hemorrhoids and use staples to cut off the blood supply to them.  Follow these instructions at home: Eating and drinking  Eat foods that have a lot of fiber in them, such as whole grains, beans, nuts, fruits, and vegetables. Ask your health care provider about  taking products that have added fiber (fiber supplements).  Drink enough fluid to keep your urine clear or pale yellow. Managing pain and swelling  Take warm sitz baths for 20 minutes, 3-4 times a day to ease pain and discomfort.  If directed, apply ice to the affected area. Using ice packs between sitz baths may be helpful. ? Put ice in a plastic bag. ? Place a towel between your skin and the  bag. ? Leave the ice on for 20 minutes, 2-3 times a day. General instructions  Take over-the-counter and prescription medicines only as told by your health care provider.  Use medicated creams or suppositories as told.  Exercise regularly.  Go to the bathroom when you have the urge to have a bowel movement. Do not wait.  Avoid straining to have bowel movements.  Keep the anal area dry and clean. Use wet toilet paper or moist towelettes after a bowel movement.  Do not sit on the toilet for long periods of time. This increases blood pooling and pain. Contact a health care provider if:  You have increasing pain and swelling that are not controlled by treatment or medicine.  You have uncontrolled bleeding.  You have difficulty having a bowel movement, or you are unable to have a bowel movement.  You have pain or inflammation outside the area of the hemorrhoids. This information is not intended to replace advice given to you by your health care provider. Make sure you discuss any questions you have with your health care provider. Document Released: 08/31/2000 Document Revised: 02/01/2016 Document Reviewed: 05/18/2015 Elsevier Interactive Patient Education  [employer].

## 2018-06-04 NOTE — Assessment & Plan Note (Signed)
With mild soiling at times, scant amount of blood. Symptomatic internal hemorrhoids. Discussed bowel regimen, avoidance of prolonged sitting, avoidance of straining. Return 10/31 for banding.

## 2018-06-04 NOTE — Progress Notes (Signed)
CC'D TO PCP °

## 2018-06-04 NOTE — Progress Notes (Signed)
Referring Provider: Kathyrn Drown, MD Primary Care Physician:  Kathyrn Drown, MD  Primary GI: Dr. Gala Romney   Chief Complaint  Patient presents with  . Hemorrhoids    foul smell from rectum with brown discharge    HPI:   Steven Gallagher is a 42 y.o. male presenting today with a history of chronic flatulence, bacterial overgrowth, constipation, last seen in Sept 2018. In the past, he was convinced he had a fecal odor emanating from him, worsened when sweating.   Colonoscopy completed in interim from last visit with non-bleeding internal hemorrhoids, otherwise normal. EGD with normal esophagus s/p dilation, small hiatal hernia, normal duodenum.   Found pad on [place] [facility]. Brought with him in a plastic bag. Small pinpoint areas of light pink/red. States mucus at times from rectum. Feels like he has a foul smell when sweating. During breaks at his job, he will take baby wipes to freshen up. Feels something hangs out at times from rectum.   Rectal itching noted, no burning, no pain. Unproductive BMs at times. Intermittent straining. Linzess 72 mcg with diarrhea. No supplemental fiber. BM every few days. Stool little pieces. Prolonged sitting on toilet at times.   Protonix once daily. Occasional stomach burning.   Past Medical History:  Diagnosis Date  . Dizziness   . Elevated blood pressure   . GERD (gastroesophageal reflux disease)   . Hyperglycemia   . Hyperlipidemia   . Hypertension     Past Surgical History:  Procedure Laterality Date  . BACTERIAL OVERGROWTH TEST N/A 04/22/2015   Procedure: BACTERIAL OVERGROWTH TEST;  Surgeon: Daneil Dolin, MD;  [facility]: [facility];  Service: Endoscopy;  Laterality: N/A;  0700  . COLONOSCOPY N/A 07/03/2017   dr. Gala Romney: small internal hemorrhoids, otherwise normal  . ESOPHAGOGASTRODUODENOSCOPY N/A 07/03/2017   Dr. Gala Romney: normal esophagus s/p dilation, small hiatal hernia, normal duodenum  . MALONEY DILATION N/A 07/03/2017   Procedure: Venia Minks DILATION;  Surgeon: Daneil Dolin, MD;  Location: [facility];  Service: Endoscopy;  Laterality: N/A;    Current Outpatient Medications  Medication Sig Dispense Refill  . amLODipine (NORVASC) 5 MG tablet Take 1 tablet (5 mg total) by mouth daily. 90 tablet 1  . escitalopram (LEXAPRO) 20 MG tablet Take half a tablet po daily the first week,then take one tablet po QD. 30 tablet 3  . pantoprazole (PROTONIX) 40 MG tablet TAKE 1 TABLET BY MOUTH EVERY DAY 90 tablet 1  . sucralfate (CARAFATE) 1 g tablet TAKE ONE TABLET BEFORE MEALS IN 1-2 OZ OF WATER FOR 30 DAYS 270 tablet 1  . ondansetron (ZOFRAN ODT) 4 MG disintegrating tablet Take 1 tablet (4 mg total) by mouth every 8 (eight) hours as needed for nausea or vomiting. (Patient not taking: Reported on 06/04/2018) 20 tablet 0   No current facility-administered medications for this visit.     Allergies as of 06/04/2018  . (No Known Allergies)    Family History  Problem Relation Age of Onset  . Hypertension Mother   . Hypertension Father   . Hypertension Brother   . Colon cancer Neg Hx   . Colon polyps Neg Hx     Social History   Socioeconomic History  . Marital status: Married    Spouse name: Not on file  . Number of children: Not on file  . Years of education: Not on file  . Highest education level: Not on file  Occupational History  . Not on  file  Social Needs  . Financial resource strain: Not on file  . Food insecurity:    Worry: Not on file    Inability: Not on file  . Transportation needs:    Medical: Not on file    Non-medical: Not on file  Tobacco Use  . Smoking status: Never Smoker  . Smokeless tobacco: Never Used  Substance and Sexual Activity  . Alcohol use: No  . Drug use: No  . Sexual activity: Not on file  Lifestyle  . Physical activity:    Days per week: Not on file    Minutes per session: Not on file  . Stress: Not on file  Relationships  . Social connections:    Talks on phone:  Not on file    Gets together: Not on file    Attends religious service: Not on file    Active member of club or organization: Not on file    Attends meetings of clubs or organizations: Not on file    Relationship status: Not on file  Other Topics Concern  . Not on file  Social History Narrative  . Not on file    Review of Systems: As mentioned in  HPI   Physical Exam: BP 130/89   Pulse 86   Temp 97.7 F (36.5 C) (Oral)   Ht 5\' 8"  (1.727 m)   Wt 180 lb 12.8 oz (82 kg)   BMI 27.49 kg/m  General:   Alert and oriented. No distress noted. Pleasant and cooperative.  Head:  Normocephalic and atraumatic. Eyes:  Conjuctiva clear without scleral icterus. Mouth:  Oral mucosa pink and moist.  Abdomen:  +BS, soft, non-tender and non-distended. No rebound or guarding. No HSM or masses noted. Rectal: no external hemorrhoids, no obvious fissures. Internal exam without mass. As of note, I did not appreciate any "odor" that patient is concerned about.  Msk:  Symmetrical without gross deformities. Normal posture. Extremities:  Without edema. Neurologic:  Alert and  oriented x4 Psych:  Alert and cooperative. Normal mood and affect.

## 2018-06-04 NOTE — Assessment & Plan Note (Signed)
Unproductive BMs and contributing to exacerbating known internal hemorrhoids. Linzess with diarrhea even low dose. Start Amitiza 8 mcg BID with food. Add supplemental fiber. Return 10/31 for hemorrhoid banding.

## 2018-06-05 NOTE — Telephone Encounter (Signed)
[facility] for pt - ? Cancel referral to [facility]??

## 2018-06-15 ENCOUNTER — Telehealth: Payer: Self-pay | Admitting: Family Medicine

## 2018-06-15 NOTE — Telephone Encounter (Signed)
Patient's recent testing shows A1c prediabetes also shows LDL slightly elevated.  Patient follows up here typically once a year and sees his workplace nurse practitioner on a regular basis

## 2018-06-18 ENCOUNTER — Ambulatory Visit: Payer: BLUE CROSS/BLUE SHIELD | Admitting: Family Medicine

## 2018-06-18 ENCOUNTER — Encounter: Payer: Self-pay | Admitting: Family Medicine

## 2018-06-18 VITALS — BP 134/92 | Temp 98.7°F | Ht 68.0 in | Wt 179.0 lb

## 2018-06-18 DIAGNOSIS — L748 Other eccrine sweat disorders: Secondary | ICD-10-CM | POA: Diagnosis not present

## 2018-06-18 DIAGNOSIS — L750 Bromhidrosis: Secondary | ICD-10-CM

## 2018-06-18 DIAGNOSIS — F411 Generalized anxiety disorder: Secondary | ICD-10-CM | POA: Diagnosis not present

## 2018-06-18 MED ORDER — SERTRALINE HCL 50 MG PO TABS
50.0000 mg | ORAL_TABLET | Freq: Every day | ORAL | 1 refills | Status: DC
Start: 2018-06-18 — End: 2018-12-29

## 2018-06-18 NOTE — Progress Notes (Addendum)
   Subjective:    Patient ID: Steven Gallagher, male    DOB: 15-Oct-1975, 42 y.o.   MRN: [id number]  Rash  This is a new problem. The current episode started 1 to 4 weeks ago. The affected locations include the groin (going toward rectal area). Rash characteristics: has an odor.  Daily nice patient He relates that he is noticing odor in his private area He states he tries various bath washes and does not seem to get rid of it Occasionally has itching Is going to have rectal banding of hemorrhoids coming up in the near future Denies any other issues  Is up-to-date on colonoscopy  Review of Systems  Skin: Positive for rash.   He denies any severe abdominal pain burning dysuria testicle pain    Objective:   Physical Exam 15 minutes was spent with patient today discussing healthcare issues which they came.  More than 50% of this visit-total duration of visit-was spent in counseling and coordination of care.  Please see diagnosis regarding the focus of this coordination and care  The patient brought in his lab work Overall looks good bad cholesterol slightly elevated healthy diet recommended repeat lab work at least yearly  Lungs are clear respiratory rate normal heart regular no murmurs abdomen is soft no masses GU appears normal no hernia testicles appear normal no obvious rash noted     Assessment & Plan:  Rash-I recommend Tinactin spray I do not find any evidence of any unusual odor aspect currently I believe the patient is more sensitive to his body odor than what he other people are I reassured him regarding this  Patient would like to try different medication for anxiety states the other one caused stomach pain we will try sertraline low-dose follow-up within 3 months

## 2018-06-25 ENCOUNTER — Telehealth: Payer: Self-pay | Admitting: Family Medicine

## 2018-06-25 DIAGNOSIS — K2900 Acute gastritis without bleeding: Secondary | ICD-10-CM

## 2018-06-25 NOTE — Telephone Encounter (Signed)
Referral ordered in Epic. 

## 2018-06-25 NOTE — Telephone Encounter (Signed)
Pt would like a referral to Dr. Gaylyn Cheers at [facility]   Their fax# [phone]  (pt states he's keeping appointment with [place] on 07/17/18)  Please initiate referral in system so I may process

## 2018-06-25 NOTE — Telephone Encounter (Signed)
Please go ahead with this referral thank you

## 2018-06-30 ENCOUNTER — Encounter: Payer: Self-pay | Admitting: Family Medicine

## 2018-07-15 DIAGNOSIS — K219 Gastro-esophageal reflux disease without esophagitis: Secondary | ICD-10-CM | POA: Diagnosis not present

## 2018-07-15 DIAGNOSIS — K648 Other hemorrhoids: Secondary | ICD-10-CM | POA: Diagnosis not present

## 2018-07-15 DIAGNOSIS — L748 Other eccrine sweat disorders: Secondary | ICD-10-CM | POA: Diagnosis not present

## 2018-07-17 ENCOUNTER — Ambulatory Visit (INDEPENDENT_AMBULATORY_CARE_PROVIDER_SITE_OTHER): Payer: BLUE CROSS/BLUE SHIELD | Admitting: Gastroenterology

## 2018-07-17 ENCOUNTER — Encounter: Payer: Self-pay | Admitting: Gastroenterology

## 2018-07-17 VITALS — BP 149/91 | HR 93 | Temp 97.0°F | Ht 68.0 in | Wt 174.4 lb

## 2018-07-17 DIAGNOSIS — K641 Second degree hemorrhoids: Secondary | ICD-10-CM | POA: Diagnosis not present

## 2018-07-17 NOTE — Patient Instructions (Signed)
Avoid straining.  Benefiber 2 teaspoons with every meal. We have provided a handout of where to find this.   Limit toilet time to 2-3 minutes.   Call me with any interim problems!   We are scheduling a follow-up appt in mid November.  I enjoyed seeing you again today! As you know, I value our relationship and want to provide genuine, compassionate, and quality care. I welcome your feedback. If you receive a survey regarding your visit,  I greatly appreciate you taking time to fill this out. See you next time!  [name], PhD, ANP-BC [facility]

## 2018-07-17 NOTE — Progress Notes (Addendum)
[facility] Banding Note:    Pleasant 42 year old male presenting with history of chronic flatulence, bacterial overgrowth, constipation, last seen in Sept 2018. In the past, he was convinced he had a fecal odor emanating from him, worsened when sweating. He has been referred to [facility] for concern of body odor. Colonoscopy recently on file with non-bleeding internal hemorrhoids. He has had issues with scant hematochezia, fecal seepage, wearing pad that he has found from [place]. Sensation of something "hanging" from the rectum at times but resolves on own without manual assistance. Feels more posteriorly.     The patient presents with symptomatic grade 2 hemorrhoids, unresponsive to maximal medical therapy, requesting rubber band ligation of his hemorrhoidal disease. All risks, benefits, and alternative forms of therapy were described and informed consent was obtained.    The decision was made to band the  RIGHT POSTERIOR internal hemorrhoid, and the [place] was used to perform band ligation without complication. Digital anorectal examination was then performed to assure proper positioning of the band, and to adjust the banded tissue as required. The patient was discharged home without pain or other issues. Dietary and behavioral recommendations were given, along with follow-up instructions. The patient will return in  November for followup and possible additional banding as required.  No complications were encountered and the patient tolerated the procedure well.  [name], PhD, ANP-BC [facility]

## 2018-08-06 ENCOUNTER — Encounter: Payer: Self-pay | Admitting: Gastroenterology

## 2018-08-06 ENCOUNTER — Ambulatory Visit: Payer: BLUE CROSS/BLUE SHIELD | Admitting: Gastroenterology

## 2018-08-06 VITALS — BP 135/87 | HR 93 | Temp 97.0°F | Ht 68.0 in | Wt 175.2 lb

## 2018-08-06 DIAGNOSIS — K641 Second degree hemorrhoids: Secondary | ICD-10-CM

## 2018-08-06 NOTE — Patient Instructions (Signed)
Avoid straining.  Benefiber 2 teaspoons twice daily  Limit toilet time to 2-3 minutes  Please call me with any issues in the meantime!  We will schedule a  followup appointment in 2-3 weeks from now!  I enjoyed seeing you again today! As you know, I value our relationship and want to provide genuine, compassionate, and quality care. I welcome your feedback. If you receive a survey regarding your visit,  I greatly appreciate you taking time to fill this out. See you next time!  [name], PhD, ANP-BC [facility]

## 2018-08-06 NOTE — Progress Notes (Signed)
[place] Banding Note:  Steven Gallagher presents today in follow-up after banding of the right posterior internal hemorrhoid on 07/17/18. Previous symptoms of soilage, seepage, even foul odor he has experience (and evaluated at [facility]) has improved, interestingly. Has cut toilet time down to 5 minutes. Continues fiber. Still with intermittent low-volume bleeding and occasional itching/discomfort.   The patient presents with symptomatic grade 1-2 hemorrhoids, unresponsive to maximal medical therapy, requesting rubber band ligation of his hemorrhoidal disease.  All risks, benefits, and alternative forms of therapy were described and informed consent was obtained.   The decision was made to band the right anterior internal hemorrhoid, and the [place] was used to perform band ligation without complication. Digital anorectal examination was then performed to assure proper positioning of the band, and to adjust the banded tissue as required. The patient was discharged home without pain or other issues. Dietary and behavioral recommendations were given, along with follow-up instructions. The patient will return in 2-3 weeks for followup and possible finall banding as required (left lateral).   No complications were encountered and the patient tolerated the procedure well  [name], PhD, [facility]

## 2018-08-13 ENCOUNTER — Other Ambulatory Visit: Payer: Self-pay | Admitting: Family Medicine

## 2018-08-19 ENCOUNTER — Encounter

## 2018-08-19 ENCOUNTER — Ambulatory Visit: Payer: BLUE CROSS/BLUE SHIELD | Admitting: Gastroenterology

## 2018-08-28 ENCOUNTER — Ambulatory Visit: Payer: BLUE CROSS/BLUE SHIELD | Admitting: Gastroenterology

## 2018-08-28 ENCOUNTER — Encounter: Payer: Self-pay | Admitting: Gastroenterology

## 2018-08-28 VITALS — BP 134/83 | HR 98 | Temp 97.0°F | Ht 68.0 in | Wt 178.8 lb

## 2018-08-28 DIAGNOSIS — K641 Second degree hemorrhoids: Secondary | ICD-10-CM

## 2018-08-28 NOTE — Progress Notes (Signed)
[place] Banding Note:    Steven Gallagher presents today in follow-up for his third banding. Previously banding right anterior and right posterior.  At last visits, previous symptoms of soilage, seepage, even foul odor he has experienced (and evaluated at [facility]) had improved, interestingly. He presents today for evaluation of potential final banding of left lateral internal hemorrhoids. Notes improvement overall, but still with some low-volume bleeding and itching. Fecal soilage has significantly improved since first banding.   The patient presents with symptomatic grade 2 hemorrhoids, unresponsive to maximal medical therapy, requesting rubber band ligation of his hemorrhoidal disease.  All risks, benefits, and alternative forms of therapy were described and informed consent was obtained.  The decision was made to band the left lateral internal hemorrhoid, and the [place] was used to perform band ligation without complication. Digital anorectal examination was then performed to assure proper positioning of the band, and to adjust the banded tissue as required. The patient was discharged home without pain or other issues. Dietary and behavioral recommendations were given, along with follow-up instructions. The patient will return in 3 months for routine office visit.   No complications were encountered and the patient tolerated the procedure well   [name], PhD, [facility]

## 2018-08-28 NOTE — Patient Instructions (Signed)
Continue to avoid straining as you are doing, and limit toilet time to 2-3 minutes.  Continue Benefiber 2 teaspoons twice daily  Please call me in about a week with an update on how you are doing.  We have banded all 3 columns as of today. Hopefully, you will continue doing well and have resolution of your symptoms moving forward.   I will see you in 3 months for a routine office visit.   Have a Merry Christmas!   I enjoyed seeing you again today! As you know, I value our relationship and want to provide genuine, compassionate, and quality care. I welcome your feedback. If you receive a survey regarding your visit,  I greatly appreciate you taking time to fill this out. See you next time!  [name], PhD, ANP-BC [facility]

## 2018-10-20 ENCOUNTER — Ambulatory Visit (INDEPENDENT_AMBULATORY_CARE_PROVIDER_SITE_OTHER): Payer: BLUE CROSS/BLUE SHIELD | Admitting: Family Medicine

## 2018-10-20 ENCOUNTER — Encounter: Payer: Self-pay | Admitting: Family Medicine

## 2018-10-20 VITALS — BP 148/92 | Temp 98.6°F | Ht 68.0 in | Wt 183.0 lb

## 2018-10-20 DIAGNOSIS — J019 Acute sinusitis, unspecified: Secondary | ICD-10-CM

## 2018-10-20 MED ORDER — DOXYCYCLINE HYCLATE 100 MG PO TABS: 100.0000 mg | ORAL_TABLET | Freq: Two times a day (BID) | ORAL | 0 refills | Status: DC

## 2018-10-20 NOTE — Progress Notes (Signed)
   Subjective:    Patient ID: Steven Gallagher, male    DOB: 02-Apr-1976, 43 y.o.   MRN: [id number]  Sinusitis  This is a new problem. Episode onset: 2 weeks. Associated symptoms include congestion, coughing, headaches and sinus pressure. Pertinent negatives include no ear pain or shortness of breath. (Pain in teeth) Treatments tried: cordicidin.   2 week hx of nasal congestion. Dry cough. No fevers. Reports maxillary pain.    Review of Systems  Constitutional: Negative for fever.  HENT: Positive for congestion, sinus pressure and sinus pain. Negative for ear pain.   Respiratory: Positive for cough. Negative for shortness of breath and wheezing.   Neurological: Positive for headaches.       Objective:   Physical Exam Vitals signs and nursing note reviewed.  Constitutional:      General: He is not in acute distress.    Appearance: Normal appearance. He is not toxic-appearing.  HENT:     Head: Normocephalic and atraumatic.     Right Ear: Tympanic membrane normal.     Left Ear: Tympanic membrane normal.     Nose: Congestion present.     Right Sinus: Maxillary sinus tenderness present. No frontal sinus tenderness.     Left Sinus: Maxillary sinus tenderness present. No frontal sinus tenderness.     Mouth/Throat:     Mouth: Mucous membranes are moist.     Pharynx: Oropharynx is clear.  Eyes:     General:        Right eye: No discharge.        Left eye: No discharge.  Neck:     Musculoskeletal: Neck supple. No neck rigidity.  Cardiovascular:     Rate and Rhythm: Normal rate and regular rhythm.     Heart sounds: Normal heart sounds.  Pulmonary:     Effort: Pulmonary effort is normal. No respiratory distress.     Breath sounds: Normal breath sounds.  Lymphadenopathy:     Cervical: No cervical adenopathy.  Skin:    General: Skin is warm and dry.  Neurological:     Mental Status: He is alert and oriented to person, place, and time.           Assessment & Plan:  Acute  rhinosinusitis  Discussed likely post-viral sinusitis, will treat with abx. Symptomatic care discussed. Warning signs discussed. F/u if symptoms worsen or fail to improve.

## 2018-10-20 NOTE — Patient Instructions (Signed)
Sinusitis, Adult  Sinusitis is inflammation of your sinuses. Sinuses are hollow spaces in the bones around your face. Your sinuses are located:   Around your eyes.   In the middle of your forehead.   Behind your nose.   In your cheekbones.  Mucus normally drains out of your sinuses. When your nasal tissues become inflamed or swollen, mucus can become trapped or blocked. This allows bacteria, viruses, and fungi to grow, which leads to infection. Most infections of the sinuses are caused by a virus.  Sinusitis can develop quickly. It can last for up to 4 weeks (acute) or for more than 12 weeks (chronic). Sinusitis often develops after a cold.  What are the causes?  This condition is caused by anything that creates swelling in the sinuses or stops mucus from draining. This includes:   Allergies.   Asthma.   Infection from bacteria or viruses.   Deformities or blockages in your nose or sinuses.   Abnormal growths in the nose (nasal polyps).   Pollutants, such as chemicals or irritants in the air.   Infection from fungi (rare).  What increases the risk?  You are more likely to develop this condition if you:   Have a weak body defense system (immune system).   Do a lot of swimming or diving.   Overuse nasal sprays.   Smoke.  What are the signs or symptoms?  The main symptoms of this condition are pain and a feeling of pressure around the affected sinuses. Other symptoms include:   Stuffy nose or congestion.   Thick drainage from your nose.   Swelling and warmth over the affected sinuses.   Headache.   Upper toothache.   A cough that may get worse at night.   Extra mucus that collects in the throat or the back of the nose (postnasal drip).   Decreased sense of smell and taste.   Fatigue.   A fever.   Sore throat.   Bad breath.  How is this diagnosed?  This condition is diagnosed based on:   Your symptoms.   Your medical history.   A physical exam.   Tests to find out if your condition is  acute or chronic. This may include:  ? Checking your nose for nasal polyps.  ? Viewing your sinuses using a device that has a light (endoscope).  ? Testing for allergies or bacteria.  ? Imaging tests, such as an MRI or CT scan.  In rare cases, a bone biopsy may be done to rule out more serious types of fungal sinus disease.  How is this treated?  Treatment for sinusitis depends on the cause and whether your condition is chronic or acute.   If caused by a virus, your symptoms should go away on their own within 10 days. You may be given medicines to relieve symptoms. They include:  ? Medicines that shrink swollen nasal passages (topical intranasal decongestants).  ? Medicines that treat allergies (antihistamines).  ? A spray that eases inflammation of the nostrils (topical intranasal corticosteroids).  ? Rinses that help get rid of thick mucus in your nose (nasal saline washes).   If caused by bacteria, your health care provider may recommend waiting to see if your symptoms improve. Most bacterial infections will get better without antibiotic medicine. You may be given antibiotics if you have:  ? A severe infection.  ? A weak immune system.   If caused by narrow nasal passages or nasal polyps, you may need   to have surgery.  Follow these instructions at home:  Medicines   Take, use, or apply over-the-counter and prescription medicines only as told by your health care provider. These may include nasal sprays.   If you were prescribed an antibiotic medicine, take it as told by your health care provider. Do not stop taking the antibiotic even if you start to feel better.  Hydrate and humidify     Drink enough fluid to keep your urine pale yellow. Staying hydrated will help to thin your mucus.   Use a cool mist humidifier to keep the humidity level in your home above 50%.   Inhale steam for 10-15 minutes, 3-4 times a day, or as told by your health care provider. You can do this in the bathroom while a hot shower is  running.   Limit your exposure to cool or dry air.  Rest   Rest as much as possible.   Sleep with your head raised (elevated).   Make sure you get enough sleep each night.  General instructions     Apply a warm, moist washcloth to your face 3-4 times a day or as told by your health care provider. This will help with discomfort.   Wash your hands often with soap and water to reduce your exposure to germs. If soap and water are not available, use hand sanitizer.   Do not smoke. Avoid being around people who are smoking (secondhand smoke).   Keep all follow-up visits as told by your health care provider. This is important.  Contact a health care provider if:   You have a fever.   Your symptoms get worse.   Your symptoms do not improve within 10 days.  Get help right away if:   You have a severe headache.   You have persistent vomiting.   You have severe pain or swelling around your face or eyes.   You have vision problems.   You develop confusion.   Your neck is stiff.   You have trouble breathing.  Summary   Sinusitis is soreness and inflammation of your sinuses. Sinuses are hollow spaces in the bones around your face.   This condition is caused by nasal tissues that become inflamed or swollen. The swelling traps or blocks the flow of mucus. This allows bacteria, viruses, and fungi to grow, which leads to infection.   If you were prescribed an antibiotic medicine, take it as told by your health care provider. Do not stop taking the antibiotic even if you start to feel better.   Keep all follow-up visits as told by your health care provider. This is important.  This information is not intended to replace advice given to you by your health care provider. Make sure you discuss any questions you have with your health care provider.  Document Released: 09/03/2005 Document Revised: 02/03/2018 Document Reviewed: 02/03/2018  Elsevier Interactive Patient Education  2019 [employer]

## 2018-11-05 ENCOUNTER — Telehealth: Payer: Self-pay | Admitting: Family Medicine

## 2018-11-05 NOTE — Telephone Encounter (Signed)
Pt was seen 2/3 by Ria Comment. He would like another round of antibiotics called in to [id number] - [place], [place].   He is states his symptoms never cleared up. He is still having toothache, congestion and sinus pressure.   CB# [phone]

## 2018-11-05 NOTE — Telephone Encounter (Signed)
I called and left a message on vm to r/c. 

## 2018-11-06 NOTE — Telephone Encounter (Signed)
I called and left message to r.c.

## 2018-11-07 MED ORDER — AMOXICILLIN-POT CLAVULANATE 875-125 MG PO TABS: 1.0000 | ORAL_TABLET | Freq: Two times a day (BID) | ORAL | 0 refills | Status: AC

## 2018-11-07 NOTE — Telephone Encounter (Signed)
Headache, toothache, nose congestion. No fever, no cough, no shortness of breath. Please advise. Thank you

## 2018-11-07 NOTE — Telephone Encounter (Signed)
Aug 875 bid ten d 

## 2018-11-07 NOTE — Telephone Encounter (Signed)
I called again left a message on vm to r/c if he still needed our assistance.

## 2018-11-07 NOTE — Telephone Encounter (Signed)
I called and left a detailed message that patient medication was sent to the requested pharmacy, asked that he please r/c to confirm.

## 2018-11-18 ENCOUNTER — Other Ambulatory Visit: Payer: Self-pay | Admitting: Family Medicine

## 2018-11-27 ENCOUNTER — Ambulatory Visit: Payer: BLUE CROSS/BLUE SHIELD | Admitting: Gastroenterology

## 2018-12-10 ENCOUNTER — Ambulatory Visit: Payer: BLUE CROSS/BLUE SHIELD | Admitting: Gastroenterology

## 2018-12-23 ENCOUNTER — Other Ambulatory Visit: Payer: Self-pay

## 2018-12-23 ENCOUNTER — Encounter: Payer: Self-pay | Admitting: Family Medicine

## 2018-12-23 ENCOUNTER — Ambulatory Visit (INDEPENDENT_AMBULATORY_CARE_PROVIDER_SITE_OTHER): Payer: BLUE CROSS/BLUE SHIELD | Admitting: Family Medicine

## 2018-12-23 VITALS — Ht 68.0 in | Wt 183.0 lb

## 2018-12-23 DIAGNOSIS — R21 Rash and other nonspecific skin eruption: Secondary | ICD-10-CM | POA: Diagnosis not present

## 2018-12-23 MED ORDER — VALACYCLOVIR HCL 1 G PO TABS: 1000.0000 mg | ORAL_TABLET | Freq: Two times a day (BID) | ORAL | 0 refills | Status: DC

## 2018-12-23 NOTE — Progress Notes (Signed)
   Subjective:    Patient ID: Steven Gallagher, male    DOB: 05-24-1976, 43 y.o.   MRN: [id number]  HPI  Patient with rash in groin area after shaving. Patient with a blistered area on the penile shaft region this been going on over the past several days he states he was shaving that area and he thinks he disrupted an area that cause this problem he denies any other particular troubles denies fever sweats.  He did state that last night he had some chills but no sore throat cough or wheezing or difficulty breathing patient wore a mask per protocol we were wearing mask Review of Systems    Please see above Objective:   Physical Exam Abdomen is soft no guarding rebound or tenderness patient does not appear to be in any distress genital area shows multiple different little small blisters This is somewhat consistent with possibility of herpes       Assessment & Plan:  I would recommend going ahead and treatment with medication Valtrex 1 g twice daily 10 days Viral culture for herpes We will call the patient with the results of the test [phone]

## 2018-12-23 NOTE — Patient Instructions (Signed)
I would recommend taking Valtrex 1 g Take 1 p.o. twice daily for the next 10 days We should have results on your test within a week's time and we will call you If you have not heard from [place] by Wednesday please call me and leave a message for me to call you back Thanks-Dr. Nicki Reaper

## 2018-12-29 ENCOUNTER — Other Ambulatory Visit: Payer: Self-pay | Admitting: Family Medicine

## 2018-12-29 LAB — HERPES SIMPLEX VIRUS CULTURE

## 2018-12-29 LAB — SPECIMEN STATUS REPORT

## 2019-01-02 ENCOUNTER — Other Ambulatory Visit: Payer: Self-pay | Admitting: *Deleted

## 2019-01-02 DIAGNOSIS — Z113 Encounter for screening for infections with a predominantly sexual mode of transmission: Secondary | ICD-10-CM

## 2019-01-03 LAB — RPR: RPR Ser Ql: NONREACTIVE

## 2019-01-03 LAB — HIV ANTIBODY (ROUTINE TESTING W REFLEX): HIV Screen 4th Generation wRfx: NONREACTIVE

## 2019-01-05 NOTE — Progress Notes (Signed)
Left message to return call 

## 2019-01-19 ENCOUNTER — Other Ambulatory Visit: Payer: Self-pay

## 2019-01-19 ENCOUNTER — Ambulatory Visit (INDEPENDENT_AMBULATORY_CARE_PROVIDER_SITE_OTHER): Payer: BLUE CROSS/BLUE SHIELD | Admitting: Family Medicine

## 2019-01-19 DIAGNOSIS — R6883 Chills (without fever): Secondary | ICD-10-CM | POA: Diagnosis not present

## 2019-01-19 DIAGNOSIS — A6001 Herpesviral infection of penis: Secondary | ICD-10-CM | POA: Diagnosis not present

## 2019-01-19 NOTE — Progress Notes (Signed)
   Subjective:    Patient ID: Steven Gallagher, male    DOB: 01/16/1976, 43 y.o.   MRN: [id number] Phone only- video not available to patient HPI Pt states he is having tingling and chills on arms and legs. Feels like pins and needles. No nausea, no vomiting, no fever. Going on about 3 -4 days. Pt has not tried anything for tingling and chills.  Patient denies high fever denies body aches denies wheezing difficulty breathing coughing headaches he does state he gets of light chills it does not cause him the shivering only lasts a few seconds comes intermittently over the past few days has not felt fatigue or tiredness  Patient also had questions regarding how to prevent passing herpes onto his wife was recently diagnosed with herpes but has gone away Virtual Visit via Video Note  I connected with Steven Gallagher on 01/19/19 at 11:00 AM EDT by a video enabled telemedicine application and verified that I am speaking with the correct person using two identifiers.  Location: Patient: home Provider: office   I discussed the limitations of evaluation and management by telemedicine and the availability of in person appointments. The patient expressed understanding and agreed to proceed.  History of Present Illness:    Observations/Objective:   Assessment and Plan:   Follow Up Instructions:    I discussed the assessment and treatment plan with the patient. The patient was provided an opportunity to ask questions and all were answered. The patient agreed with the plan and demonstrated an understanding of the instructions.   The patient was advised to call back or seek an in-person evaluation if the symptoms worsen or if the condition fails to improve as anticipated.  I provided 15 minutes of non-face-to-face time during this encounter.   Vicente Males, LPN    Review of Systems     Objective:   Physical Exam    Prevention of genital herpes discussed    Assessment & Plan:   Genital herpes resolved patient would like to have be on a daily medication we will go with Valtrex 1 g daily  Minimal chills I doubt that this is coronavirus warning signs were discussed follow-up if progressive troubles  As for intervention no need for any intervention currently  If progressive symptoms or worse over the next week let us know we would do lab work

## 2019-01-27 ENCOUNTER — Telehealth: Payer: Self-pay | Admitting: Family Medicine

## 2019-01-27 DIAGNOSIS — Z114 Encounter for screening for human immunodeficiency virus [HIV]: Secondary | ICD-10-CM

## 2019-01-27 DIAGNOSIS — R6883 Chills (without fever): Secondary | ICD-10-CM

## 2019-01-27 DIAGNOSIS — A6001 Herpesviral infection of penis: Secondary | ICD-10-CM

## 2019-01-27 NOTE — Telephone Encounter (Signed)
Please advise. Thank you

## 2019-01-27 NOTE — Telephone Encounter (Signed)
Pt called to say that his symptoms are no worse but he's still having chills & arm aches  Does pt need to have lab work?  See note from 01/19/2019  Please advise & call pt [phone] (OK to LMOVM)

## 2019-01-27 NOTE — Telephone Encounter (Signed)
Liver, CK, sed rate, CBC

## 2019-01-27 NOTE — Telephone Encounter (Signed)
Lab orders placed. Left message for patient to return call

## 2019-01-28 NOTE — Telephone Encounter (Signed)
Pt returned call and verbalized understanding  

## 2019-02-16 ENCOUNTER — Telehealth: Payer: Self-pay | Admitting: Family Medicine

## 2019-02-16 NOTE — Telephone Encounter (Signed)
Patient had HIV and RPR blood work one month ago 12/2018 please advise

## 2019-02-16 NOTE — Telephone Encounter (Signed)
Patient has current lab orders but would like HIV and STD testing added, (again).

## 2019-02-17 ENCOUNTER — Other Ambulatory Visit: Payer: Self-pay | Admitting: Family Medicine

## 2019-02-17 NOTE — Telephone Encounter (Signed)
1 refill will need follow-up visit later this summer

## 2019-02-17 NOTE — Telephone Encounter (Signed)
This blood work that was ordered was for a separate problem I would recommend him getting this  As for adding HIV test I would recommend that the patient wait for at least 3 months from the previous test before doing so that would be late July-unless there is a particular reason to run sooner

## 2019-02-17 NOTE — Telephone Encounter (Signed)
Patient calling back to see if blood work needs to be ordered

## 2019-02-18 DIAGNOSIS — R6883 Chills (without fever): Secondary | ICD-10-CM | POA: Diagnosis not present

## 2019-02-18 DIAGNOSIS — A6001 Herpesviral infection of penis: Secondary | ICD-10-CM | POA: Diagnosis not present

## 2019-02-18 NOTE — Telephone Encounter (Signed)
Patient is aware of all. 

## 2019-02-19 LAB — HEPATIC FUNCTION PANEL
ALT: 21 IU/L (ref 0–44)
AST: 20 IU/L (ref 0–40)
Albumin: 4.9 g/dL (ref 4.0–5.0)
Alkaline Phosphatase: 84 IU/L (ref 39–117)
Bilirubin Total: 0.3 mg/dL (ref 0.0–1.2)
Bilirubin, Direct: 0.08 mg/dL (ref 0.00–0.40)
Total Protein: 7.8 g/dL (ref 6.0–8.5)

## 2019-02-19 LAB — CBC WITH DIFFERENTIAL/PLATELET
Basophils Absolute: 0.1 10*3/uL (ref 0.0–0.2)
Basos: 1 %
EOS (ABSOLUTE): 0.1 10*3/uL (ref 0.0–0.4)
Eos: 2 %
Hematocrit: 44 % (ref 37.5–51.0)
Hemoglobin: 14.5 g/dL (ref 13.0–17.7)
Immature Grans (Abs): 0 10*3/uL (ref 0.0–0.1)
Immature Granulocytes: 1 %
Lymphocytes Absolute: 2.6 10*3/uL (ref 0.7–3.1)
Lymphs: 43 %
MCH: 26.2 pg — ABNORMAL LOW (ref 26.6–33.0)
MCHC: 33 g/dL (ref 31.5–35.7)
MCV: 79 fL (ref 79–97)
Monocytes Absolute: 0.5 10*3/uL (ref 0.1–0.9)
Monocytes: 7 %
Neutrophils Absolute: 2.8 10*3/uL (ref 1.4–7.0)
Neutrophils: 46 %
Platelets: 268 10*3/uL (ref 150–450)
RBC: 5.54 x10E6/uL (ref 4.14–5.80)
RDW: 13.3 % (ref 11.6–15.4)
WBC: 6.1 10*3/uL (ref 3.4–10.8)

## 2019-02-19 LAB — CK: Total CK: 257 U/L (ref 49–439)

## 2019-02-19 LAB — SEDIMENTATION RATE: Sed Rate: 28 mm/hr — ABNORMAL HIGH (ref 0–15)

## 2019-02-19 NOTE — Addendum Note (Signed)
Addended by: Dairl Ponder on: 02/19/2019 10:57 AM   Modules accepted: Orders

## 2019-02-25 ENCOUNTER — Other Ambulatory Visit: Payer: Self-pay | Admitting: Family Medicine

## 2019-03-26 ENCOUNTER — Telehealth: Payer: Self-pay | Admitting: Family Medicine

## 2019-03-26 NOTE — Telephone Encounter (Signed)
Pt returned call and verbalized understanding. Pt states that if he gets worse he will call back to schedule an appointment

## 2019-03-26 NOTE — Telephone Encounter (Signed)
Pt is still having chills and sweats since 5/4 he states the chills are on and off all day and is not sure if its related to the herpes virus

## 2019-03-26 NOTE — Telephone Encounter (Signed)
Patient states he was seen for this 01/19/19 and is still having the same sx- Please advise

## 2019-03-26 NOTE — Telephone Encounter (Signed)
Nurses The sertraline at times can cause sweats but typically not chills The lab work that he did back in June looks reassuring I do not feel that his ongoing symptoms are related to the herpes Patient does need to check his temperature when he is having the chills to see if he is having any significant fever considered 100 degrees orally or higher Certainly if patient is feeling worse I recommend a follow-up office visit I will not be back into the office until the week after next If the patient feels he needs to be seen sooner he can schedule with [name] or [name] next week Please talk with patient see what he would like to do

## 2019-03-26 NOTE — Telephone Encounter (Signed)
Left message to return call 

## 2019-04-13 DIAGNOSIS — Z114 Encounter for screening for human immunodeficiency virus [HIV]: Secondary | ICD-10-CM | POA: Diagnosis not present

## 2019-04-14 LAB — HIV ANTIBODY (ROUTINE TESTING W REFLEX): HIV Screen 4th Generation wRfx: NONREACTIVE

## 2019-05-11 ENCOUNTER — Other Ambulatory Visit: Payer: Self-pay

## 2019-05-11 ENCOUNTER — Ambulatory Visit (INDEPENDENT_AMBULATORY_CARE_PROVIDER_SITE_OTHER): Payer: BC Managed Care – PPO | Admitting: Family Medicine

## 2019-05-11 DIAGNOSIS — F439 Reaction to severe stress, unspecified: Secondary | ICD-10-CM

## 2019-05-11 DIAGNOSIS — F411 Generalized anxiety disorder: Secondary | ICD-10-CM | POA: Diagnosis not present

## 2019-05-11 DIAGNOSIS — K219 Gastro-esophageal reflux disease without esophagitis: Secondary | ICD-10-CM

## 2019-05-11 DIAGNOSIS — I10 Essential (primary) hypertension: Secondary | ICD-10-CM | POA: Diagnosis not present

## 2019-05-11 MED ORDER — SERTRALINE HCL 50 MG PO TABS: 50.0000 mg | ORAL_TABLET | Freq: Every day | ORAL | 1 refills | Status: DC

## 2019-05-11 MED ORDER — AMLODIPINE BESYLATE 5 MG PO TABS: 5.0000 mg | ORAL_TABLET | Freq: Every day | ORAL | 1 refills | Status: DC

## 2019-05-11 MED ORDER — VALACYCLOVIR HCL 500 MG PO TABS: 500.0000 mg | ORAL_TABLET | Freq: Every day | ORAL | 1 refills | Status: DC

## 2019-05-11 NOTE — Patient Instructions (Signed)
Shingrix and shingles prevention: know the facts!   Shingrix is a very effective vaccine to prevent shingles.   Shingles is a reactivation of chickenpox -more than 99% of Americans born before 1980 have had chickenpox even if they do not remember it. One in every 10 people who get shingles have severe long-lasting nerve pain as a result.   33 out of a 100 older adults will get shingles if they are unvaccinated.     This vaccine is very important for your health This vaccine is indicated for anyone 50 years or older. You can get this vaccine even if you have already had shingles because you can get the disease more than once in a lifetime.  Your risk for shingles and its complications increases with age.  This vaccine has 2 doses.  The second dose would be 2 to 6 months after the first dose.  If you had Zostavax vaccine in the past you should still get Shingrix. ( Zostavax is only 70% effective and it loses significant strength over a few years .)  This vaccine is given through the pharmacy.  The cost of the vaccine is through your insurance. The pharmacy can inform you of the total costs.  Common side effects including soreness in the arm, some redness and swelling, also some feel fatigue muscle soreness headache low-grade fever.  Side effects typically go away within 2 to 3 days. Remember-the pain from shingles can last a lifetime but these side effects of the vaccine will only last a few days at most. It is very important to get both doses in order to protect yourself fully.   Please get this vaccine at your earliest convenience at your trusted pharmacy.      Results for orders placed or performed in visit on 01/27/19  Hepatic function panel  Result Value Ref Range   Total Protein 7.8 6.0 - 8.5 g/dL   Albumin 4.9 4.0 - 5.0 g/dL   Bilirubin Total 0.3 0.0 - 1.2 mg/dL   Bilirubin, Direct 0.08 0.00 - 0.40 mg/dL   Alkaline Phosphatase 84 39 - 117 IU/L   AST 20 0 - 40 IU/L   ALT  21 0 - 44 IU/L  CK (Creatine Kinase)  Result Value Ref Range   Total CK 257 49 - 439 U/L  Sed Rate (ESR)  Result Value Ref Range   Sed Rate 28 (H) 0 - 15 mm/hr  CBC with Differential  Result Value Ref Range   WBC 6.1 3.4 - 10.8 x10E3/uL   RBC 5.54 4.14 - 5.80 x10E6/uL   Hemoglobin 14.5 13.0 - 17.7 g/dL   Hematocrit 44.0 37.5 - 51.0 %   MCV 79 79 - 97 fL   MCH 26.2 (L) 26.6 - 33.0 pg   MCHC 33.0 31.5 - 35.7 g/dL   RDW 13.3 11.6 - 15.4 %   Platelets 268 150 - 450 x10E3/uL   Neutrophils 46 Not Estab. %   Lymphs 43 Not Estab. %   Monocytes 7 Not Estab. %   Eos 2 Not Estab. %   Basos 1 Not Estab. %   Neutrophils Absolute 2.8 1.4 - 7.0 x10E3/uL   Lymphocytes Absolute 2.6 0.7 - 3.1 x10E3/uL   Monocytes Absolute 0.5 0.1 - 0.9 x10E3/uL   EOS (ABSOLUTE) 0.1 0.0 - 0.4 x10E3/uL   Basophils Absolute 0.1 0.0 - 0.2 x10E3/uL   Immature Granulocytes 1 Not Estab. %   Immature Grans (Abs) 0.0 0.0 - 0.1 x10E3/uL  HIV Antibody (routine  testing w rflx)  Result Value Ref Range   HIV Screen 4th Generation wRfx Non Reactive Non Reactive

## 2019-05-11 NOTE — Progress Notes (Signed)
   Subjective:    Patient ID: Steven Gallagher, male    DOB: [birth year], 43 y.o.   MRN: [id number] telephone HPIFollow up on anxiety and depression.  Patient feels he is handling stress fairly well denies any major setbacks.  Energy level overall been pretty good.  Wants to discuss taking valtrex.  He is interested in a daily Valtrex to try to lessen his risk of passing this onto his wife he denies any major outbreaks recently  Chills off and on for a couple of months.  This occurs more so when he gets stressed he feels chilled but he does not have any fever no body aches no other particular troubles  Virtual Visit via Telephone Note  I connected with Steven Gallagher on 05/11/19 at 11:00 AM EDT by telephone and verified that I am speaking with the correct person using two identifiers.  Location: Patient: home Provider: office   I discussed the limitations, risks, security and privacy concerns of performing an evaluation and management service by telephone and the availability of in person appointments. I also discussed with the patient that there may be a patient responsible charge related to this service. The patient expressed understanding and agreed to proceed.   History of Present Illness:    Observations/Objective:   Assessment and Plan:   Follow Up Instructions:    I discussed the assessment and treatment plan with the patient. The patient was provided an opportunity to ask questions and all were answered. The patient agreed with the plan and demonstrated an understanding of the instructions.   The patient was advised to call back or seek an in-person evaluation if the symptoms worsen or if the condition fails to improve as anticipated.  I provided 15 minutes of non-face-to-face time during this encounter.      Review of Systems  Constitutional: Negative for activity change, fatigue and fever.  HENT: Negative for congestion and rhinorrhea.   Respiratory: Negative for  cough and shortness of breath.   Cardiovascular: Negative for chest pain and leg swelling.  Gastrointestinal: Negative for abdominal pain, diarrhea and nausea.  Genitourinary: Negative for dysuria and hematuria.  Neurological: Negative for weakness and headaches.  Psychiatric/Behavioral: Negative for agitation and behavioral problems.  No chest tightness pressure pain shortness of breath     Objective:   Physical Exam Today's visit was via telephone Physical exam was not possible for this visit        Assessment & Plan:  1. Essential hypertension Blood pressure good control takes his medicine regular basis we did talk about how if he is able to stay very active and eat healthy may be able to reduce the medicine and what parameters watch for  2. Gastroesophageal reflux disease without esophagitis This under very good control currently but I recommend his PPI to be every other day if he can tolerate it fine otherwise take it every day  3. Stress Stress levels are doing good he would like to stay on his medicine refills were given  4. Generalized anxiety disorder Stress levels are running in their anxiety levels hanging in there refills given  Valtrex 500 mg daily does suppress the possibility of passing this onto his wife

## 2019-05-18 ENCOUNTER — Ambulatory Visit (INDEPENDENT_AMBULATORY_CARE_PROVIDER_SITE_OTHER): Payer: BC Managed Care – PPO | Admitting: Family Medicine

## 2019-05-18 ENCOUNTER — Other Ambulatory Visit: Payer: Self-pay

## 2019-05-18 DIAGNOSIS — M545 Low back pain: Secondary | ICD-10-CM

## 2019-05-18 DIAGNOSIS — S39012A Strain of muscle, fascia and tendon of lower back, initial encounter: Secondary | ICD-10-CM

## 2019-05-18 IMAGING — DX DG CHEST 2V
2 series · 2 of 2 positions shown · non-contrast
Comparison: 07/03/2010

CLINICAL DATA: Chest pain after lifting weights 2 days ago.

EXAM:
CHEST  2 VIEW

[chest pa]
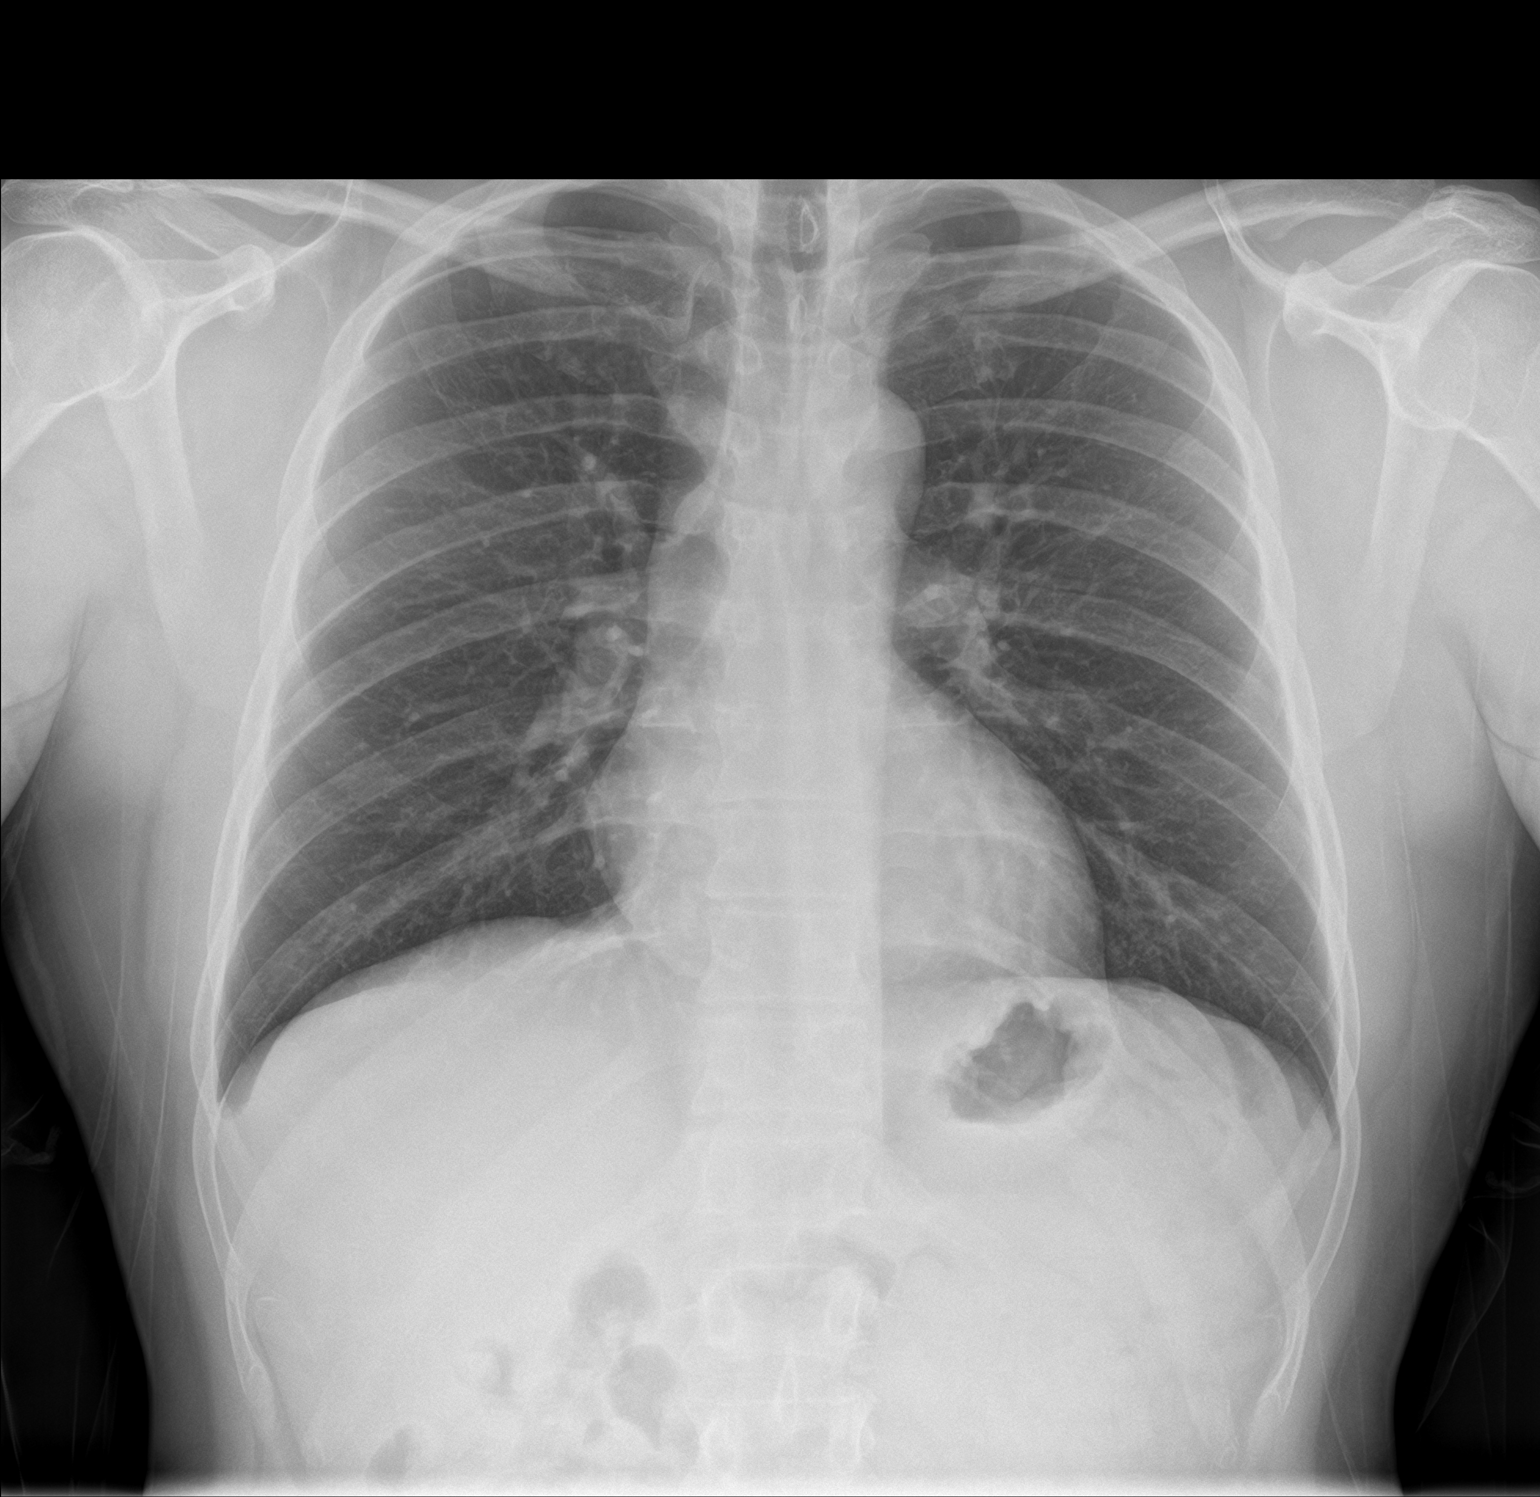

[chest lat]
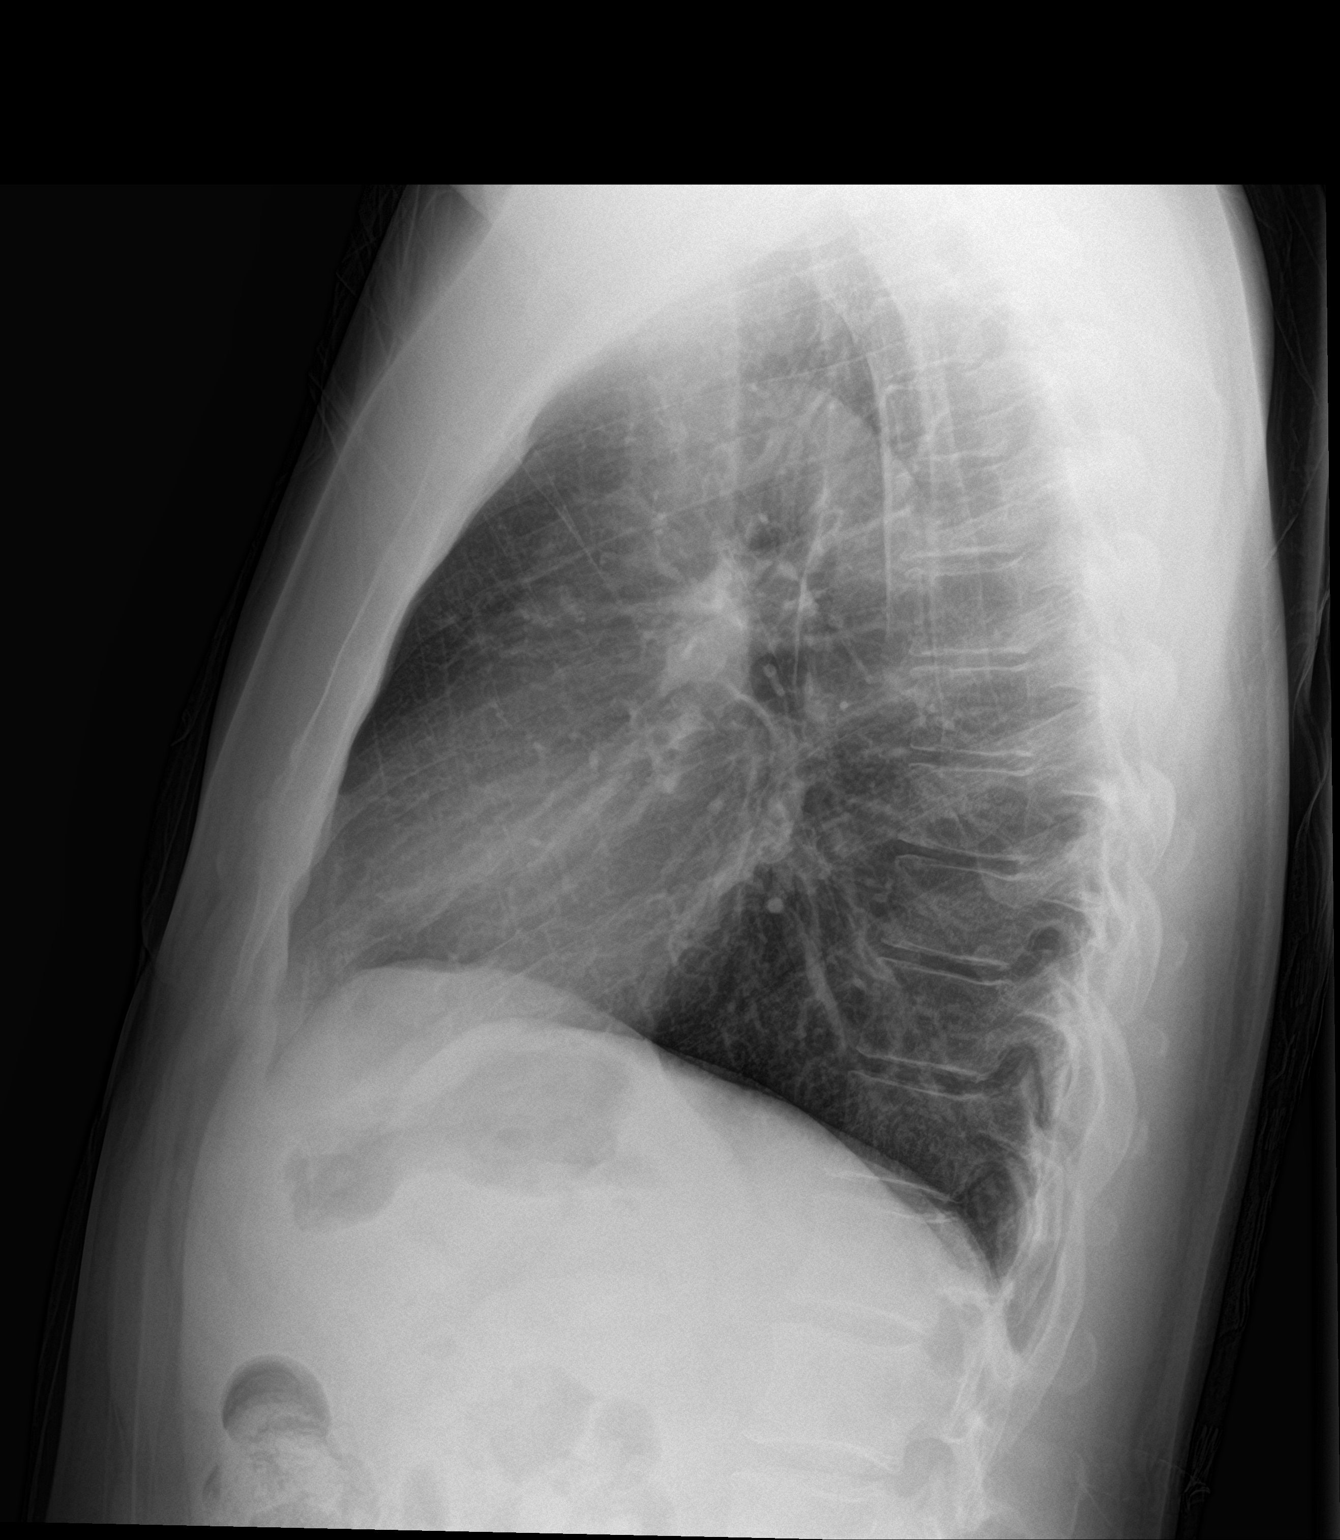

[2 of 2 positions shown; findings below may reference images not displayed]

FINDINGS: The heart size and mediastinal contours are within normal limits.
Both lungs are clear. The visualized skeletal structures are
unremarkable.
IMPRESSION: No active cardiopulmonary disease.

## 2019-05-18 MED ORDER — ETODOLAC 400 MG PO TABS: ORAL_TABLET | ORAL | 0 refills | Status: DC

## 2019-05-18 NOTE — Progress Notes (Signed)
   Subjective:  Audio plus video  Patient ID: Steven Gallagher, male    DOB: 08-Apr-1976, 43 y.o.   MRN: [id number]  Back Pain This is a new problem. Episode onset: 2 months ago. The pain is present in the lumbar spine. The quality of the pain is described as cramping. The pain does not radiate. The symptoms are aggravated by sitting and bending. Treatments tried: bengay.   Virtual Visit via Telephone Note  I connected with Sena Slate on 05/18/19 at  2:00 PM EDT by telephone and verified that I am speaking with the correct person using two identifiers.  Location: Patient: home Provider: office  I discussed the limitations, risks, security and privacy concerns of performing an evaluation and management service by telephone and the availability of in person appointments. I also discussed with the patient that there may be a patient responsible charge related to this service. The patient expressed understanding and agreed to proceed.   History of Present Illness:    Observations/Objective:   Assessment and Plan:   Follow Up Instructions:    I discussed the assessment and treatment plan with the patient. The patient was provided an opportunity to ask questions and all were answered. The patient agreed with the plan and demonstrated an understanding of the instructions.   The patient was advised to call back or seek an in-person evaluation if the symptoms worsen or if the condition fails to improve as anticipated.  I provided 16 minutes of non-face-to-face time during this encounter.  Patient notes low back pain.  Right lower lumbar region.  Patient lifting weights currently.  Also does a fair amount of heavy work at pain does not extend into the legs.  Worse with movement.  Has taken as needed over-the-counter medications.    Review of Systems  Musculoskeletal: Positive for back pain.       Objective:   Physical Exam  Virtual      Assessment & Plan:  Impression  subacute lumbar strain.  Anti-inflammatory medicine prescribed.  No imaging rationale discussed.  Symptom care and local measures discussed.  Warning signs discussed

## 2019-05-19 ENCOUNTER — Encounter: Payer: Self-pay | Admitting: Family Medicine

## 2019-06-09 ENCOUNTER — Ambulatory Visit: Payer: BLUE CROSS/BLUE SHIELD | Admitting: Gastroenterology

## 2019-06-11 ENCOUNTER — Ambulatory Visit (INDEPENDENT_AMBULATORY_CARE_PROVIDER_SITE_OTHER): Payer: BC Managed Care – PPO | Admitting: Gastroenterology

## 2019-06-11 ENCOUNTER — Other Ambulatory Visit: Payer: Self-pay

## 2019-06-11 ENCOUNTER — Encounter: Payer: Self-pay | Admitting: Gastroenterology

## 2019-06-11 DIAGNOSIS — K649 Unspecified hemorrhoids: Secondary | ICD-10-CM | POA: Diagnosis not present

## 2019-06-11 NOTE — Assessment & Plan Note (Addendum)
43 year old male with significant improvement in symptoms s/p banding X 3 late last year. Now with mild persistent symptoms that are annoying; we discussed avoidance of constipation, add fiber back to his diet daily and start with lower dosing, increasing as tolerated. Decrease toilet time even more to 2-3 minutes. Banding performed again today at office visit as noted in procedure note. Return in 4 months or sooner if needed. I anticipate he will have resolution of symptoms going forward.

## 2019-06-11 NOTE — Patient Instructions (Signed)
Add fiber to your diet as we talked about. Start at the lower end and increase as tolerated. Let me know if this doesn't help.  Limit toilet time to 2-3 minutes.   Avoid straining.  I will see you in 3 months!  I enjoyed seeing you again today! As you know, I value our relationship and want to provide genuine, compassionate, and quality care. I welcome your feedback. If you receive a survey regarding your visit,  I greatly appreciate you taking time to fill this out. See you next time!  [name], PhD, ANP-BC [facility]

## 2019-06-11 NOTE — Progress Notes (Signed)
Referring Provider: Kathyrn Drown, MD Primary Care Physician:  Kathyrn Drown, MD  Primary GI: Dr. Gala Romney   Chief Complaint  Patient presents with  . Hemorrhoids    occas bleeding    HPI:   Steven Gallagher is a 43 y.o. male presenting today with a history of symptomatic hemorrhoids, s/p banding X 3 last year. His symptoms of bleeding, itching, and fecal soilage improved significantly when last seen Dec 2019.   After banding, noted 99% better. Some seepage at times. Much better than it was. Occasional odor when sweating. Has noted occasional bleeding if straining. Straining every other BM. BM usually 2-3 times per week. Was doing fiber but states hurt his stomach. 1-2 tbsps of Benefiber. Was having cramping and gas. Tries to minimize toilet time. 5 minutes or less.  Sometimes not productive.   Past Medical History:  Diagnosis Date  . Dizziness   . Elevated blood pressure   . GERD (gastroesophageal reflux disease)   . Hyperglycemia   . Hyperlipidemia   . Hypertension     Past Surgical History:  Procedure Laterality Date  . BACTERIAL OVERGROWTH TEST N/A 04/22/2015   Procedure: BACTERIAL OVERGROWTH TEST;  Surgeon: Daneil Dolin, MD;  Location: [facility];  Service: Endoscopy;  Laterality: N/A;  0700  . COLONOSCOPY N/A 07/03/2017   dr. Gala Romney: small internal hemorrhoids, otherwise normal  . ESOPHAGOGASTRODUODENOSCOPY N/A 07/03/2017   Dr. Gala Romney: normal esophagus s/p dilation, small hiatal hernia, normal duodenum  . MALONEY DILATION N/A 07/03/2017   Procedure: Venia Minks DILATION;  Surgeon: Daneil Dolin, MD;  Location: [facility];  Service: Endoscopy;  Laterality: N/A;    Current Outpatient Medications  Medication Sig Dispense Refill  . amLODipine (NORVASC) 5 MG tablet Take 1 tablet (5 mg total) by mouth daily. 90 tablet 1  . etodolac (LODINE) 400 MG tablet Take one bid with food 28 tablet 0  . pantoprazole (PROTONIX) 40 MG tablet TAKE 1 TABLET BY MOUTH EVERY DAY 90  tablet 1  . sertraline (ZOLOFT) 50 MG tablet Take 1 tablet (50 mg total) by mouth daily. 90 tablet 1  . valACYclovir (VALTREX) 500 MG tablet Take 1 tablet (500 mg total) by mouth daily. 90 tablet 1   No current facility-administered medications for this visit.     Allergies as of 06/11/2019  . (No Known Allergies)    Family History  Problem Relation Age of Onset  . Hypertension Mother   . Hypertension Father   . Hypertension Brother   . Colon cancer Neg Hx   . Colon polyps Neg Hx     Social History   Socioeconomic History  . Marital status: Married    Spouse name: Not on file  . Number of children: Not on file  . Years of education: Not on file  . Highest education level: Not on file  Occupational History  . Not on file  Social Needs  . Financial resource strain: Not on file  . Food insecurity    Worry: Not on file    Inability: Not on file  . Transportation needs    Medical: Not on file    Non-medical: Not on file  Tobacco Use  . Smoking status: Never Smoker  . Smokeless tobacco: Never Used  Substance and Sexual Activity  . Alcohol use: No  . Drug use: No  . Sexual activity: Not on file  Lifestyle  . Physical activity    Days per week: Not on  file    Minutes per session: Not on file  . Stress: Not on file  Relationships  . Social Herbalist on phone: Not on file    Gets together: Not on file    Attends religious service: Not on file    Active member of club or organization: Not on file    Attends meetings of clubs or organizations: Not on file    Relationship status: Not on file  Other Topics Concern  . Not on file  Social History Narrative  . Not on file    Review of Systems: Gen: Denies fever, chills, anorexia. Denies fatigue, weakness, weight loss.  CV: Denies chest pain, palpitations, syncope, peripheral edema, and claudication. Resp: Denies dyspnea at rest, cough, wheezing, coughing up blood, and pleurisy. GI: see HPI Derm: Denies  rash, itching, dry skin Psych: Denies depression, anxiety, memory loss, confusion. No homicidal or suicidal ideation.  Heme: see HPI  Physical Exam: BP 136/79   Pulse 89   Temp (!) 97 F (36.1 C) (Oral)   Ht 5\' 8"  (1.727 m)   Wt 181 lb 9.6 oz (82.4 kg)   BMI 27.61 kg/m  General:   Alert and oriented. No distress noted. Pleasant and cooperative.  Head:  Normocephalic and atraumatic. Eyes:  Conjuctiva clear without scleral icterus. Abdomen:  +BS, soft, non-tender and non-distended. No rebound or guarding. No HSM or masses noted. Rectal: no external hemorrhoids or fissure. DRE without mass.  Msk:  Symmetrical without gross deformities. Normal posture. Extremities:  Without edema. Neurologic:  Alert and  oriented x4 Psych:  Alert and cooperative. Normal mood and affect.   Domino Banding Note  The patient presents with symptomatic internal hemorrhoids, Grade 1-2, much improved after prior banding. Still with some minor symptoms of fecal seepage, occasional bleeding, and would like an additional banding. All risks, benefits, and alternative forms of therapy were described and informed consent was obtained.  The decision was made to place ligator neutrally, and the Meridian was used to perform band ligation without complication. Digital anorectal examination was then performed to assure proper positioning of the band, and no adjustment was needed. The patient was discharged home without pain or other issues. Dietary and behavioral recommendations were given.  No complications were encountered and the patient tolerated the procedure well.

## 2019-06-17 ENCOUNTER — Ambulatory Visit: Payer: BLUE CROSS/BLUE SHIELD | Admitting: Nurse Practitioner

## 2019-06-18 ENCOUNTER — Telehealth: Payer: Self-pay | Admitting: Family Medicine

## 2019-06-18 NOTE — Telephone Encounter (Signed)
Flu vaccine documented and patient notified.

## 2019-06-18 NOTE — Telephone Encounter (Signed)
Patient got the flu shot yesterday at his work and wants to know if it is ok to take the shot since he has herpes, even though he already got the shot.

## 2019-06-18 NOTE — Telephone Encounter (Signed)
It is perfectly safe that he did so And I am glad that he did get the shot! Nurses please recorded historically that he had his flu shot thank you

## 2019-08-05 ENCOUNTER — Other Ambulatory Visit: Payer: Self-pay

## 2019-08-05 ENCOUNTER — Ambulatory Visit (INDEPENDENT_AMBULATORY_CARE_PROVIDER_SITE_OTHER): Payer: BC Managed Care – PPO | Admitting: Family Medicine

## 2019-08-05 DIAGNOSIS — Z114 Encounter for screening for human immunodeficiency virus [HIV]: Secondary | ICD-10-CM

## 2019-08-05 DIAGNOSIS — G44209 Tension-type headache, unspecified, not intractable: Secondary | ICD-10-CM | POA: Diagnosis not present

## 2019-08-05 MED ORDER — NAPROXEN 500 MG PO TABS: ORAL_TABLET | ORAL | 0 refills | Status: DC

## 2019-08-05 NOTE — Progress Notes (Signed)
   Subjective:    Patient ID: Steven Gallagher, male    DOB: Jan 26, 1976, 43 y.o.   MRN: [id number]  Headache  This is a new problem. Episode onset: 3 weeks. The problem occurs constantly. Pain location: dull pain in back and front of head. Pertinent negatives include no coughing, ear pain, fever, nausea, numbness, rhinorrhea or vomiting. Associated symptoms comments: Eyes burning. He has tried acetaminophen for the symptoms.  Patient describes a headache is mainly in the front of the head the back of the head not into the neck not down the arms also have some allergy symptoms in the eyes no burning down the arms no numbness or tingling no unilateral weakness does not wake him up at night no nausea or vomiting PMH benign Virtual Visit via Telephone Note  I connected with Steven Gallagher on 08/05/19 at  1:10 PM EST by telephone and verified that I am speaking with the correct person using two identifiers.  Location: Patient: home Provider: office   I discussed the limitations, risks, security and privacy concerns of performing an evaluation and management service by telephone and the availability of in person appointments. I also discussed with the patient that there may be a patient responsible charge related to this service. The patient expressed understanding and agreed to proceed.   History of Present Illness:    Observations/Objective:   Assessment and Plan:   Follow Up Instructions:    I discussed the assessment and treatment plan with the patient. The patient was provided an opportunity to ask questions and all were answered. The patient agreed with the plan and demonstrated an understanding of the instructions.   The patient was advised to call back or seek an in-person evaluation if the symptoms worsen or if the condition fails to improve as anticipated.  I provided 15 minutes of non-face-to-face time during this encounter.      Review of Systems  Constitutional: Negative  for activity change, chills and fever.  HENT: Negative for congestion, ear pain and rhinorrhea.   Eyes: Negative for discharge.  Respiratory: Negative for cough and wheezing.   Cardiovascular: Negative for chest pain.  Gastrointestinal: Negative for nausea and vomiting.  Musculoskeletal: Negative for arthralgias.  Neurological: Positive for headaches. Negative for light-headedness and numbness.       Objective:   Physical Exam Today's visit was via telephone Physical exam was not possible for this visit   Patient thinks his blood pressure is doing okay     Assessment & Plan:  Patient concerned about HIV I told him that his next test should be due at the end of January but he would like to get it in December patient had herpes exposure several months back  Headache more than likely tension headache anti-inflammatory twice daily as needed if ongoing troubles or problems follow-up for further and more thorough evaluation including blood pressure warning signs were discussed if he gets progressive to follow-up

## 2019-08-07 ENCOUNTER — Telehealth: Payer: Self-pay | Admitting: Family Medicine

## 2019-08-07 NOTE — Telephone Encounter (Signed)
Left message to return call 

## 2019-08-07 NOTE — Telephone Encounter (Signed)
Patient notified and scheduled office visit next week

## 2019-08-07 NOTE — Telephone Encounter (Signed)
Patient was told to call back with update on headaches from last visit still having headaches and now has eye pressure also. Is this a virtual visit or office ? Please advise

## 2019-08-07 NOTE — Telephone Encounter (Signed)
Please advise. Thank you

## 2019-08-07 NOTE — Telephone Encounter (Signed)
Patient may have a in person visit next week  If for some reason patient starts having any type of worrisome symptoms over the weekend such as severe vomiting double vision unilateral numbness weakness needs to go to ER

## 2019-08-11 ENCOUNTER — Other Ambulatory Visit: Payer: Self-pay

## 2019-08-11 ENCOUNTER — Ambulatory Visit (INDEPENDENT_AMBULATORY_CARE_PROVIDER_SITE_OTHER): Payer: BC Managed Care – PPO | Admitting: Family Medicine

## 2019-08-11 DIAGNOSIS — I10 Essential (primary) hypertension: Secondary | ICD-10-CM | POA: Diagnosis not present

## 2019-08-11 DIAGNOSIS — G44209 Tension-type headache, unspecified, not intractable: Secondary | ICD-10-CM | POA: Diagnosis not present

## 2019-08-11 NOTE — Progress Notes (Signed)
   Subjective:    Patient ID: Steven Gallagher, male    DOB: [birth year], 43 y.o.   MRN: [id number]  HPI  Patient calls for a follow up on headaches. Patient states his headaches are a lot better than they were-has dull headache at times but not like they were before. Patient with intermittent headaches are actually doing much better he gets maybe once a month a sharp pain in his head lasts only a second or 2 goes away no nausea or vomiting with it.  Denies any double vision denies waking up with headaches denies middle of the night problem states his wife is checking his blood pressure and its been good Virtual Visit via Video Note  I connected with Steven Gallagher on 08/11/19 at 10:00 AM EST by a video enabled telemedicine application and verified that I am speaking with the correct person using two identifiers.  Location: Patient: home Provider: office   I discussed the limitations of evaluation and management by telemedicine and the availability of in person appointments. The patient expressed understanding and agreed to proceed.  History of Present Illness:    Observations/Objective:   Assessment and Plan:   Follow Up Instructions:    I discussed the assessment and treatment plan with the patient. The patient was provided an opportunity to ask questions and all were answered. The patient agreed with the plan and demonstrated an understanding of the instructions.   The patient was advised to call back or seek an in-person evaluation if the symptoms worsen or if the condition fails to improve as anticipated.  I provided 12 minutes of non-face-to-face time during this encounter.      Review of Systems  Constitutional: Negative for diaphoresis and fatigue.  HENT: Negative for congestion and rhinorrhea.   Respiratory: Negative for cough and shortness of breath.   Cardiovascular: Negative for chest pain and leg swelling.  Gastrointestinal: Negative for abdominal pain and  diarrhea.  Skin: Negative for color change and rash.  Neurological: Negative for dizziness and headaches.  Psychiatric/Behavioral: Negative for behavioral problems and confusion.       Objective:   Physical Exam  Today's visit was via telephone Physical exam was not possible for this visit       Assessment & Plan:  Blood pressure good control currently watch diet closely monitor periodically follow-up by spring  Intermittent headaches more than likely tension headaches hopefully will gradually improve warning signs were discussed follow-up if any ongoing troubles it middle of the night headaches double vision nausea or vomiting

## 2019-08-31 DIAGNOSIS — Z114 Encounter for screening for human immunodeficiency virus [HIV]: Secondary | ICD-10-CM | POA: Diagnosis not present

## 2019-09-01 ENCOUNTER — Encounter: Payer: Self-pay | Admitting: Family Medicine

## 2019-09-01 LAB — HIV ANTIBODY (ROUTINE TESTING W REFLEX): HIV Screen 4th Generation wRfx: NONREACTIVE

## 2019-09-03 ENCOUNTER — Encounter (HOSPITAL_COMMUNITY): Payer: Self-pay | Admitting: *Deleted

## 2019-09-03 ENCOUNTER — Emergency Department (HOSPITAL_COMMUNITY)
Admission: EM | Admit: 2019-09-03 | Discharge: 2019-09-03 | Disposition: A | Discharge status: Home / Self Care | Payer: BC Managed Care – PPO | Attending: Emergency Medicine | Admitting: Emergency Medicine

## 2019-09-03 ENCOUNTER — Emergency Department (HOSPITAL_COMMUNITY): Payer: BC Managed Care – PPO

## 2019-09-03 ENCOUNTER — Ambulatory Visit: Payer: BC Managed Care – PPO | Admitting: Gastroenterology

## 2019-09-03 DIAGNOSIS — Z20828 Contact with and (suspected) exposure to other viral communicable diseases: Secondary | ICD-10-CM | POA: Diagnosis not present

## 2019-09-03 DIAGNOSIS — Z791 Long term (current) use of non-steroidal anti-inflammatories (NSAID): Secondary | ICD-10-CM | POA: Diagnosis not present

## 2019-09-03 DIAGNOSIS — Y93I9 Activity, other involving external motion: Secondary | ICD-10-CM | POA: Diagnosis not present

## 2019-09-03 DIAGNOSIS — S12500A Unspecified displaced fracture of sixth cervical vertebra, initial encounter for closed fracture: Secondary | ICD-10-CM | POA: Insufficient documentation

## 2019-09-03 DIAGNOSIS — Y92410 Unspecified street and highway as the place of occurrence of the external cause: Secondary | ICD-10-CM | POA: Diagnosis not present

## 2019-09-03 DIAGNOSIS — I10 Essential (primary) hypertension: Secondary | ICD-10-CM | POA: Insufficient documentation

## 2019-09-03 DIAGNOSIS — S060X9A Concussion with loss of consciousness of unspecified duration, initial encounter: Secondary | ICD-10-CM | POA: Insufficient documentation

## 2019-09-03 DIAGNOSIS — M545 Low back pain: Secondary | ICD-10-CM | POA: Diagnosis not present

## 2019-09-03 DIAGNOSIS — S199XXA Unspecified injury of neck, initial encounter: Secondary | ICD-10-CM | POA: Diagnosis not present

## 2019-09-03 DIAGNOSIS — S22000A Wedge compression fracture of unspecified thoracic vertebra, initial encounter for closed fracture: Secondary | ICD-10-CM | POA: Insufficient documentation

## 2019-09-03 DIAGNOSIS — Z79899 Other long term (current) drug therapy: Secondary | ICD-10-CM | POA: Diagnosis not present

## 2019-09-03 DIAGNOSIS — M542 Cervicalgia: Secondary | ICD-10-CM | POA: Diagnosis not present

## 2019-09-03 DIAGNOSIS — S3992XA Unspecified injury of lower back, initial encounter: Secondary | ICD-10-CM | POA: Diagnosis not present

## 2019-09-03 DIAGNOSIS — Y999 Unspecified external cause status: Secondary | ICD-10-CM | POA: Diagnosis not present

## 2019-09-03 DIAGNOSIS — R410 Disorientation, unspecified: Secondary | ICD-10-CM | POA: Diagnosis not present

## 2019-09-03 DIAGNOSIS — S22040A Wedge compression fracture of fourth thoracic vertebra, initial encounter for closed fracture: Secondary | ICD-10-CM | POA: Diagnosis not present

## 2019-09-03 DIAGNOSIS — S22009A Unspecified fracture of unspecified thoracic vertebra, initial encounter for closed fracture: Secondary | ICD-10-CM | POA: Diagnosis not present

## 2019-09-03 DIAGNOSIS — M549 Dorsalgia, unspecified: Secondary | ICD-10-CM | POA: Diagnosis not present

## 2019-09-03 DIAGNOSIS — R519 Headache, unspecified: Secondary | ICD-10-CM | POA: Diagnosis not present

## 2019-09-03 DIAGNOSIS — S299XXA Unspecified injury of thorax, initial encounter: Secondary | ICD-10-CM | POA: Diagnosis not present

## 2019-09-03 DIAGNOSIS — S0990XA Unspecified injury of head, initial encounter: Secondary | ICD-10-CM | POA: Diagnosis not present

## 2019-09-03 DIAGNOSIS — Z03818 Encounter for observation for suspected exposure to other biological agents ruled out: Secondary | ICD-10-CM | POA: Diagnosis not present

## 2019-09-03 DIAGNOSIS — S22030A Wedge compression fracture of third thoracic vertebra, initial encounter for closed fracture: Secondary | ICD-10-CM | POA: Diagnosis not present

## 2019-09-03 LAB — COMPREHENSIVE METABOLIC PANEL
ALT: 27 U/L (ref 0–44)
AST: 39 U/L (ref 15–41)
Albumin: 4.6 g/dL (ref 3.5–5.0)
Alkaline Phosphatase: 73 U/L (ref 38–126)
Anion gap: 12 (ref 5–15)
BUN: 16 mg/dL (ref 6–20)
CO2: 27 mmol/L (ref 22–32)
Calcium: 9 mg/dL (ref 8.9–10.3)
Chloride: 100 mmol/L (ref 98–111)
Creatinine, Ser: 0.99 mg/dL (ref 0.61–1.24)
GFR calc Af Amer: >60 mL/min (ref >60)
GFR calc non Af Amer: >60 mL/min (ref >60)
Glucose, Bld: 124 mg/dL — ABNORMAL HIGH (ref 70–99)
Potassium: 3.3 mmol/L — ABNORMAL LOW (ref 3.5–5.1)
Sodium: 139 mmol/L (ref 135–145)
Total Bilirubin: 0.6 mg/dL (ref 0.3–1.2)
Total Protein: 8.4 g/dL — ABNORMAL HIGH (ref 6.5–8.1)

## 2019-09-03 LAB — CBC
HCT: 45 % (ref 39.0–52.0)
Hemoglobin: 14 g/dL (ref 13.0–17.0)
MCH: 25.8 pg — ABNORMAL LOW (ref 26.0–34.0)
MCHC: 31.1 g/dL (ref 30.0–36.0)
MCV: 82.9 fL (ref 80.0–100.0)
Platelets: 239 10*3/uL (ref 150–400)
RBC: 5.43 MIL/uL (ref 4.22–5.81)
RDW: 13.5 % (ref 11.5–15.5)
WBC: 14.6 10*3/uL — ABNORMAL HIGH (ref 4.0–10.5)
nRBC: 0 % (ref 0.0–0.2)

## 2019-09-03 LAB — RAPID URINE DRUG SCREEN, HOSP PERFORMED
Amphetamines: NOT DETECTED
Barbiturates: NOT DETECTED
Benzodiazepines: NOT DETECTED
Cocaine: NOT DETECTED
Opiates: NOT DETECTED
Tetrahydrocannabinol: NOT DETECTED

## 2019-09-03 LAB — PROTIME-INR
INR: 1 (ref 0.8–1.2)
Prothrombin Time: 12.6 seconds (ref 11.4–15.2)

## 2019-09-03 LAB — ETHANOL: Alcohol, Ethyl (B): <10 mg/dL (ref <10)

## 2019-09-03 LAB — SARS CORONAVIRUS 2 (TAT 6-24 HRS): SARS Coronavirus 2: NEGATIVE

## 2019-09-03 MED ORDER — ACETAMINOPHEN 325 MG PO TABS
650.0000 mg | ORAL_TABLET | Freq: Once | ORAL | Status: AC
  Administered 2019-09-03: 02:00:00 650 mg via ORAL
  Filled 2019-09-03: qty 2

## 2019-09-03 MED ORDER — HYDROCODONE-ACETAMINOPHEN 5-325 MG PO TABS: 1.0000 | ORAL_TABLET | Freq: Four times a day (QID) | ORAL | 0 refills | Status: DC | PRN

## 2019-09-03 MED ORDER — FENTANYL CITRATE (PF) 100 MCG/2ML IJ SOLN
100.0000 ug | Freq: Once | INTRAMUSCULAR | Status: AC
  Administered 2019-09-03: 100 ug via INTRAVENOUS
  Filled 2019-09-03: qty 2
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
## 2019-09-03 NOTE — ED Notes (Signed)
Pt walked around room w/out assistance, steady gait, MD notified

## 2019-09-05 ENCOUNTER — Other Ambulatory Visit: Payer: Self-pay | Admitting: Family Medicine

## 2019-09-15 ENCOUNTER — Ambulatory Visit: Payer: BC Managed Care – PPO | Admitting: Gastroenterology

## 2019-09-15 DIAGNOSIS — S12501A Unspecified nondisplaced fracture of sixth cervical vertebra, initial encounter for closed fracture: Secondary | ICD-10-CM | POA: Diagnosis not present

## 2019-09-29 DIAGNOSIS — Z6827 Body mass index (BMI) 27.0-27.9, adult: Secondary | ICD-10-CM | POA: Diagnosis not present

## 2019-09-29 DIAGNOSIS — I10 Essential (primary) hypertension: Secondary | ICD-10-CM | POA: Diagnosis not present

## 2019-09-29 DIAGNOSIS — S12501A Unspecified nondisplaced fracture of sixth cervical vertebra, initial encounter for closed fracture: Secondary | ICD-10-CM | POA: Diagnosis not present

## 2019-10-05 ENCOUNTER — Other Ambulatory Visit: Payer: Self-pay | Admitting: Student

## 2019-10-05 DIAGNOSIS — S12501A Unspecified nondisplaced fracture of sixth cervical vertebra, initial encounter for closed fracture: Secondary | ICD-10-CM

## 2019-10-12 NOTE — Progress Notes (Signed)
Referring Provider: Kathyrn Drown, MD Primary Care Physician:  Kathyrn Drown, MD Primary GI: Dr. Gala Romney    Chief Complaint  Patient presents with  . Follow-up    hemorrhoids,bleeding after bm's    HPI:   Steven Gallagher is a 44 y.o. male presenting today with a history of symptomatic hemorrhoids, s/p banding X 3 in 2019 and then again in neutral position at last visit in Sept 2020. Last colonoscopy in 2018.   Some paper hematochezia with each BMs. Noticed more recently. Bristol stool scale #4-5. Some straining. Trying to limit toilet time but is on toilet about 8-10 minutes. BM usually every other day. Has slacked off on fiber. Some bloating with fiber. Hasn't tried smaller amount. Some itching at times. No prolapsing tissue. No pain. Overall, symptoms much improved from past.    Past Medical History:  Diagnosis Date  . Dizziness   . Elevated blood pressure   . GERD (gastroesophageal reflux disease)   . Hyperglycemia   . Hyperlipidemia   . Hypertension     Past Surgical History:  Procedure Laterality Date  . BACTERIAL OVERGROWTH TEST N/A 04/22/2015   Procedure: BACTERIAL OVERGROWTH TEST;  Surgeon: Daneil Dolin, MD;  Location: [facility];  Service: Endoscopy;  Laterality: N/A;  0700  . COLONOSCOPY N/A 07/03/2017   dr. Gala Romney: small internal hemorrhoids, otherwise normal  . ESOPHAGOGASTRODUODENOSCOPY N/A 07/03/2017   Dr. Gala Romney: normal esophagus s/p dilation, small hiatal hernia, normal duodenum  . MALONEY DILATION N/A 07/03/2017   Procedure: Venia Minks DILATION;  Surgeon: Daneil Dolin, MD;  Location: [facility];  Service: Endoscopy;  Laterality: N/A;    Current Outpatient Medications  Medication Sig Dispense Refill  . amLODipine (NORVASC) 5 MG tablet Take 1 tablet (5 mg total) by mouth daily. 90 tablet 1  . pantoprazole (PROTONIX) 40 MG tablet TAKE 1 TABLET BY MOUTH EVERY DAY 90 tablet 0  . sertraline (ZOLOFT) 50 MG tablet Take 1 tablet (50 mg total) by  mouth daily. 90 tablet 1  . hydrocortisone (ANUSOL-HC) 2.5 % rectal cream Place 1 application rectally 2 (two) times daily. 30 g 1   No current facility-administered medications for this visit.    Allergies as of 10/13/2019  . (No Known Allergies)    Family History  Problem Relation Age of Onset  . Hypertension Mother   . Hypertension Father   . Hypertension Brother   . Colon cancer Neg Hx   . Colon polyps Neg Hx     Social History   Socioeconomic History  . Marital status: Married    Spouse name: Not on file  . Number of children: Not on file  . Years of education: Not on file  . Highest education level: Not on file  Occupational History  . Not on file  Tobacco Use  . Smoking status: Never Smoker  . Smokeless tobacco: Never Used  Substance and Sexual Activity  . Alcohol use: No  . Drug use: No  . Sexual activity: Not on file  Other Topics Concern  . Not on file  Social History Narrative  . Not on file   Social Determinants of Health   Financial Resource Strain:   . Difficulty of Paying Living Expenses: Not on file  Food Insecurity:   . Worried About Charity fundraiser in the Last Year: Not on file  . Ran Out of Food in the Last Year: Not on file  Transportation Needs:   .  Lack of Transportation (Medical): Not on file  . Lack of Transportation (Non-Medical): Not on file  Physical Activity:   . Days of Exercise per Week: Not on file  . Minutes of Exercise per Session: Not on file  Stress:   . Feeling of Stress : Not on file  Social Connections:   . Frequency of Communication with Friends and Family: Not on file  . Frequency of Social Gatherings with Friends and Family: Not on file  . Attends Religious Services: Not on file  . Active Member of Clubs or Organizations: Not on file  . Attends Archivist Meetings: Not on file  . Marital Status: Not on file    Review of Systems: Gen: Denies fever, chills, anorexia. Denies fatigue, weakness, weight  loss.  CV: Denies chest pain, palpitations, syncope, peripheral edema, and claudication. Resp: Denies dyspnea at rest, cough, wheezing, coughing up blood, and pleurisy. GI: see HPI Derm: Denies rash, itching, dry skin Psych: Denies depression, anxiety, memory loss, confusion. No homicidal or suicidal ideation.  Heme: see HPI  Physical Exam: BP (!) 140/95   Pulse 94   Temp 97.7 F (36.5 C)   Ht 5\' 8"  (1.727 m)   Wt 184 lb 3.2 oz (83.6 kg)   BMI 28.01 kg/m  General:   Alert and oriented. No distress noted. Pleasant and cooperative.  Head:  Normocephalic and atraumatic. Neck brace in place Eyes:  Conjuctiva clear without scleral icterus. Rectal: no obvious fissure. No external hemorrhoids. Internal exam without mass. No gross blood appreciated.  Msk:  Symmetrical without gross deformities. Normal posture. Extremities:  Without edema. Neurologic:  Alert and  oriented x4 Psych:  Alert and cooperative. Normal mood and affect.  ASSESSMENT: Steven Gallagher is a 43 y.o. male presenting today with history of symptomatic hemorrhoids s/p banding X3 in 2019 and then again neutral banding position in Sept 2020, returning with small amount paper hematochezia with wiping in setting of straining and prolonged toilet time. Clinically, he is without any significant symptoms and likely dealing with more anorectal irritation in setting of straining, mild constipation; does not seem consistent with fissure. Physical exam unrevealing.  I discussed with him limiting toilet time, avoiding straining, and adding back supplemental fiber slowly to diet, as larger doses caused bloating. If he continues to have intermittent bleeding or any worsening symptoms, would attempt another banding. Last colonoscopy fairly recent at end of 2018, just over 2 years ago.    PLAN:   Incorporate supplemental fiber back in diet  No straining, avoid prolonged toilet time  Brief course of Anusol  Return in 3-4 months  If no  improvement, consider banding  Would revisit colonoscopy in future if he continues to have bleeding despite above measures.    [name], PhD, ANP-BC [facility]

## 2019-10-13 ENCOUNTER — Encounter: Payer: Self-pay | Admitting: Gastroenterology

## 2019-10-13 ENCOUNTER — Other Ambulatory Visit: Payer: Self-pay

## 2019-10-13 ENCOUNTER — Ambulatory Visit
Admission: RE | Admit: 2019-10-13 | Discharge: 2019-10-13 | Disposition: A | Discharge status: Home / Self Care | Payer: BC Managed Care – PPO | Source: Ambulatory Visit | Attending: Student | Admitting: Student

## 2019-10-13 ENCOUNTER — Ambulatory Visit (INDEPENDENT_AMBULATORY_CARE_PROVIDER_SITE_OTHER): Payer: BC Managed Care – PPO | Admitting: Gastroenterology

## 2019-10-13 VITALS — BP 140/95 | HR 94 | Temp 97.7°F | Ht 68.0 in | Wt 184.2 lb

## 2019-10-13 DIAGNOSIS — K921 Melena: Secondary | ICD-10-CM

## 2019-10-13 DIAGNOSIS — S12501A Unspecified nondisplaced fracture of sixth cervical vertebra, initial encounter for closed fracture: Secondary | ICD-10-CM | POA: Insufficient documentation

## 2019-10-13 DIAGNOSIS — S12500A Unspecified displaced fracture of sixth cervical vertebra, initial encounter for closed fracture: Secondary | ICD-10-CM | POA: Diagnosis not present

## 2019-10-13 MED ORDER — HYDROCORTISONE (PERIANAL) 2.5 % EX CREA: 1.0000 "application " | TOPICAL_CREAM | Freq: Two times a day (BID) | CUTANEOUS | 1 refills | Status: DC

## 2019-10-13 NOTE — Patient Instructions (Signed)
Let's try a very small amount of fiber each day, and increase as tolerated. If you aren't able to tolerate higher dosing, let me know.  Avoid straining at all costs. Your hemorrhoids could return if continued straining, prolonged toilet time, etc.  Limit toilet time to 2-3 minutes  Please call me if persistent symptoms, any rectal pain, increased bleeding, soiling, itching, burning. We will attempt another banding in that case.  I have sent in a cream to use per rectum twice a day for about 7 days.   We will see you in 3-4 months!  I enjoyed seeing you again today! As you know, I value our relationship and want to provide genuine, compassionate, and quality care. I welcome your feedback. If you receive a survey regarding your visit,  I greatly appreciate you taking time to fill this out. See you next time!  [name], PhD, ANP-BC [facility]

## 2019-10-14 NOTE — Progress Notes (Signed)
Cc'ed to pcp °

## 2019-10-29 DIAGNOSIS — S12501A Unspecified nondisplaced fracture of sixth cervical vertebra, initial encounter for closed fracture: Secondary | ICD-10-CM | POA: Diagnosis not present

## 2019-11-07 ENCOUNTER — Other Ambulatory Visit: Payer: Self-pay | Admitting: Family Medicine

## 2019-11-08 ENCOUNTER — Other Ambulatory Visit: Payer: Self-pay | Admitting: Family Medicine

## 2019-11-09 NOTE — Telephone Encounter (Signed)
May have 90-day with no refills follow-up by summer

## 2019-11-10 DIAGNOSIS — M542 Cervicalgia: Secondary | ICD-10-CM | POA: Diagnosis not present

## 2019-11-10 DIAGNOSIS — S12501A Unspecified nondisplaced fracture of sixth cervical vertebra, initial encounter for closed fracture: Secondary | ICD-10-CM | POA: Diagnosis not present

## 2019-11-12 DIAGNOSIS — S12501A Unspecified nondisplaced fracture of sixth cervical vertebra, initial encounter for closed fracture: Secondary | ICD-10-CM | POA: Diagnosis not present

## 2019-11-25 ENCOUNTER — Other Ambulatory Visit: Payer: Self-pay | Admitting: Family Medicine

## 2019-11-25 DIAGNOSIS — E876 Hypokalemia: Secondary | ICD-10-CM

## 2019-11-25 DIAGNOSIS — Z114 Encounter for screening for human immunodeficiency virus [HIV]: Secondary | ICD-10-CM

## 2019-11-25 DIAGNOSIS — D72829 Elevated white blood cell count, unspecified: Secondary | ICD-10-CM

## 2019-11-25 DIAGNOSIS — R7303 Prediabetes: Secondary | ICD-10-CM

## 2019-11-25 NOTE — Telephone Encounter (Signed)
Lipid met 7 liver cbc HIV Ab A1C Prediabetes,HIV screening, leukocytosis, hypokalemia

## 2019-11-25 NOTE — Telephone Encounter (Signed)
Please schedule and then route back to nurses 

## 2019-11-25 NOTE — Telephone Encounter (Signed)
Last seen for depression and anxiety in august 2020

## 2019-11-25 NOTE — Telephone Encounter (Signed)
May have 90 days does need to do a follow-up visit later in the spring

## 2019-11-25 NOTE — Telephone Encounter (Signed)
Scheduled 4/5  Also would like to have another HIV screen done. He had one in Dec but he would just like to have a recheck to make sure.

## 2019-11-26 NOTE — Telephone Encounter (Signed)
Orders put in and pt was notified.  

## 2019-12-03 ENCOUNTER — Other Ambulatory Visit: Payer: Self-pay | Admitting: Student

## 2019-12-03 DIAGNOSIS — S12501A Unspecified nondisplaced fracture of sixth cervical vertebra, initial encounter for closed fracture: Secondary | ICD-10-CM | POA: Diagnosis not present

## 2019-12-07 ENCOUNTER — Other Ambulatory Visit: Payer: Self-pay | Admitting: Family Medicine

## 2019-12-07 DIAGNOSIS — D72829 Elevated white blood cell count, unspecified: Secondary | ICD-10-CM | POA: Diagnosis not present

## 2019-12-07 DIAGNOSIS — Z114 Encounter for screening for human immunodeficiency virus [HIV]: Secondary | ICD-10-CM | POA: Diagnosis not present

## 2019-12-07 DIAGNOSIS — R7303 Prediabetes: Secondary | ICD-10-CM | POA: Diagnosis not present

## 2019-12-07 DIAGNOSIS — E785 Hyperlipidemia, unspecified: Secondary | ICD-10-CM | POA: Diagnosis not present

## 2019-12-08 LAB — CBC WITH DIFFERENTIAL/PLATELET
Basophils Absolute: 0.1 10*3/uL (ref 0.0–0.2)
Basos: 1 %
EOS (ABSOLUTE): 0.2 10*3/uL (ref 0.0–0.4)
Eos: 3 %
Hematocrit: 41.3 % (ref 37.5–51.0)
Hemoglobin: 13.6 g/dL (ref 13.0–17.7)
Immature Grans (Abs): 0.1 10*3/uL (ref 0.0–0.1)
Immature Granulocytes: 1 %
Lymphocytes Absolute: 2.6 10*3/uL (ref 0.7–3.1)
Lymphs: 37 %
MCH: 26.6 pg (ref 26.6–33.0)
MCHC: 32.9 g/dL (ref 31.5–35.7)
MCV: 81 fL (ref 79–97)
Monocytes Absolute: 0.5 10*3/uL (ref 0.1–0.9)
Monocytes: 6 %
Neutrophils Absolute: 3.6 10*3/uL (ref 1.4–7.0)
Neutrophils: 52 %
Platelets: 289 10*3/uL (ref 150–450)
RBC: 5.12 x10E6/uL (ref 4.14–5.80)
RDW: 14 % (ref 11.6–15.4)
WBC: 7 10*3/uL (ref 3.4–10.8)

## 2019-12-08 LAB — BASIC METABOLIC PANEL
BUN/Creatinine Ratio: 7 — ABNORMAL LOW (ref 9–20)
BUN: 10 mg/dL (ref 6–24)
CO2: 24 mmol/L (ref 20–29)
Calcium: 9.8 mg/dL (ref 8.7–10.2)
Chloride: 103 mmol/L (ref 96–106)
Creatinine, Ser: 1.34 mg/dL — ABNORMAL HIGH (ref 0.76–1.27)
GFR calc Af Amer: 75 mL/min/{1.73_m2} (ref >59)
GFR calc non Af Amer: 65 mL/min/{1.73_m2} (ref >59)
Glucose: 105 mg/dL — ABNORMAL HIGH (ref 65–99)
Potassium: 4.7 mmol/L (ref 3.5–5.2)
Sodium: 144 mmol/L (ref 134–144)

## 2019-12-08 LAB — HEPATIC FUNCTION PANEL
ALT: 17 IU/L (ref 0–44)
AST: 17 IU/L (ref 0–40)
Albumin: 4.5 g/dL (ref 4.0–5.0)
Alkaline Phosphatase: 97 IU/L (ref 39–117)
Bilirubin Total: 0.3 mg/dL (ref 0.0–1.2)
Bilirubin, Direct: 0.09 mg/dL (ref 0.00–0.40)
Total Protein: 7.5 g/dL (ref 6.0–8.5)

## 2019-12-08 LAB — LIPID PANEL
Chol/HDL Ratio: 6.4 ratio — ABNORMAL HIGH (ref 0.0–5.0)
Cholesterol, Total: 262 mg/dL — ABNORMAL HIGH (ref 100–199)
HDL: 41 mg/dL (ref >39)
LDL Chol Calc (NIH): 176 mg/dL — ABNORMAL HIGH (ref 0–99)
Triglycerides: 236 mg/dL — ABNORMAL HIGH (ref 0–149)
VLDL Cholesterol Cal: 45 mg/dL — ABNORMAL HIGH (ref 5–40)

## 2019-12-08 LAB — HIV ANTIBODY (ROUTINE TESTING W REFLEX): HIV Screen 4th Generation wRfx: NONREACTIVE

## 2019-12-08 LAB — HEMOGLOBIN A1C
Est. average glucose Bld gHb Est-mCnc: 131 mg/dL
Hgb A1c MFr Bld: 6.2 % — ABNORMAL HIGH (ref 4.8–5.6)

## 2019-12-09 ENCOUNTER — Encounter: Payer: Self-pay | Admitting: Family Medicine

## 2019-12-09 ENCOUNTER — Other Ambulatory Visit: Payer: Self-pay | Admitting: Student

## 2019-12-09 DIAGNOSIS — S12501A Unspecified nondisplaced fracture of sixth cervical vertebra, initial encounter for closed fracture: Secondary | ICD-10-CM

## 2019-12-17 ENCOUNTER — Ambulatory Visit (INDEPENDENT_AMBULATORY_CARE_PROVIDER_SITE_OTHER): Payer: BC Managed Care – PPO | Admitting: Family Medicine

## 2019-12-17 ENCOUNTER — Other Ambulatory Visit: Payer: Self-pay

## 2019-12-17 ENCOUNTER — Encounter: Payer: Self-pay | Admitting: Family Medicine

## 2019-12-17 VITALS — BP 124/82 | Temp 98.1°F | Wt 187.8 lb

## 2019-12-17 DIAGNOSIS — E785 Hyperlipidemia, unspecified: Secondary | ICD-10-CM | POA: Insufficient documentation

## 2019-12-17 DIAGNOSIS — G4700 Insomnia, unspecified: Secondary | ICD-10-CM

## 2019-12-17 DIAGNOSIS — R7303 Prediabetes: Secondary | ICD-10-CM | POA: Diagnosis not present

## 2019-12-17 DIAGNOSIS — I10 Essential (primary) hypertension: Secondary | ICD-10-CM

## 2019-12-17 DIAGNOSIS — E7849 Other hyperlipidemia: Secondary | ICD-10-CM

## 2019-12-17 DIAGNOSIS — Z79899 Other long term (current) drug therapy: Secondary | ICD-10-CM

## 2019-12-17 HISTORY — DX: Hyperlipidemia, unspecified: E78.5

## 2019-12-17 MED ORDER — SERTRALINE HCL 100 MG PO TABS: 100.0000 mg | ORAL_TABLET | Freq: Every day | ORAL | 4 refills | Status: DC

## 2019-12-17 MED ORDER — AMLODIPINE BESYLATE 5 MG PO TABS: 5.0000 mg | ORAL_TABLET | Freq: Every day | ORAL | 1 refills | Status: DC

## 2019-12-17 NOTE — Progress Notes (Signed)
Subjective:    Patient ID: Steven Gallagher, male    DOB: 02/12/1976, 44 y.o.   MRN: [id number]  HPI Patient comes in today for a medication check and follow up. Insomnia, unspecified type  Essential hypertension - Plan: Basic metabolic panel  Prediabetes - Plan: Hemoglobin A1c  Other hyperlipidemia - Plan: Lipid panel  High risk medication use - Plan: Hepatic function panel  Cholesterol profile shows significant elevation patient states he has not been doing a good job watching his diet.  He would do better job watching diet he wants to repeat the lab work Patient also finds himself feeling down blue sad since having neck fracture has not been able to work not been able to exercise has gained weight.  He is taking the sertraline daily.  He denies being suicidal but sometimes wonders if life is worth anything but he denies being suicidal Patient would like to discuss not being able to fall asleep without taking some P.M. medication.  He relates feeling stressed at night difficult time laying down going to sleep he states he drinks a fair amount of sodas Mountain Dew's per day  Patient wants to discuss increasing anxiety medication. Results for orders placed or performed in visit on 11/25/19  Lipid panel  Result Value Ref Range   Cholesterol, Total 262 (H) 100 - 199 mg/dL   Triglycerides 236 (H) 0 - 149 mg/dL   HDL 41 >39 mg/dL   VLDL Cholesterol Cal 45 (H) 5 - 40 mg/dL   LDL Chol Calc (NIH) 176 (H) 0 - 99 mg/dL   Chol/HDL Ratio 6.4 (H) 0.0 - 5.0 ratio  Hepatic function panel  Result Value Ref Range   Total Protein 7.5 6.0 - 8.5 g/dL   Albumin 4.5 4.0 - 5.0 g/dL   Bilirubin Total 0.3 0.0 - 1.2 mg/dL   Bilirubin, Direct 0.09 0.00 - 0.40 mg/dL   Alkaline Phosphatase 97 39 - 117 IU/L   AST 17 0 - 40 IU/L   ALT 17 0 - 44 IU/L  Basic metabolic panel  Result Value Ref Range   Glucose 105 (H) 65 - 99 mg/dL   BUN 10 6 - 24 mg/dL   Creatinine, Ser 1.34 (H) 0.76 - 1.27 mg/dL   GFR  calc non Af Amer 65 >59 mL/min/1.73   GFR calc Af Amer 75 >59 mL/min/1.73   BUN/Creatinine Ratio 7 (L) 9 - 20   Sodium 144 134 - 144 mmol/L   Potassium 4.7 3.5 - 5.2 mmol/L   Chloride 103 96 - 106 mmol/L   CO2 24 20 - 29 mmol/L   Calcium 9.8 8.7 - 10.2 mg/dL  CBC with Differential/Platelet  Result Value Ref Range   WBC 7.0 3.4 - 10.8 x10E3/uL   RBC 5.12 4.14 - 5.80 x10E6/uL   Hemoglobin 13.6 13.0 - 17.7 g/dL   Hematocrit 41.3 37.5 - 51.0 %   MCV 81 79 - 97 fL   MCH 26.6 26.6 - 33.0 pg   MCHC 32.9 31.5 - 35.7 g/dL   RDW 14.0 11.6 - 15.4 %   Platelets 289 150 - 450 x10E3/uL   Neutrophils 52 Not Estab. %   Lymphs 37 Not Estab. %   Monocytes 6 Not Estab. %   Eos 3 Not Estab. %   Basos 1 Not Estab. %   Neutrophils Absolute 3.6 1.4 - 7.0 x10E3/uL   Lymphocytes Absolute 2.6 0.7 - 3.1 x10E3/uL   Monocytes Absolute 0.5 0.1 - 0.9 x10E3/uL  EOS (ABSOLUTE) 0.2 0.0 - 0.4 x10E3/uL   Basophils Absolute 0.1 0.0 - 0.2 x10E3/uL   Immature Granulocytes 1 Not Estab. %   Immature Grans (Abs) 0.1 0.0 - 0.1 x10E3/uL  HIV Antibody (routine testing w rflx)  Result Value Ref Range   HIV Screen 4th Generation wRfx Non Reactive Non Reactive  Hemoglobin A1c  Result Value Ref Range   Hgb A1c MFr Bld 6.2 (H) 4.8 - 5.6 %   Est. average glucose Bld gHb Est-mCnc 131 mg/dL     Review of Systems  Constitutional: Negative for diaphoresis and fatigue.  HENT: Negative for congestion and rhinorrhea.   Respiratory: Negative for cough and shortness of breath.   Cardiovascular: Negative for chest pain and leg swelling.  Gastrointestinal: Negative for abdominal pain and diarrhea.  Skin: Negative for color change and rash.  Neurological: Negative for dizziness and headaches.  Psychiatric/Behavioral: Positive for dysphoric mood. Negative for behavioral problems and confusion.       Objective:   Physical Exam Vitals reviewed.  Constitutional:      General: He is not in acute distress. HENT:     Head:  Normocephalic and atraumatic.  Eyes:     General:        Right eye: No discharge.        Left eye: No discharge.  Neck:     Trachea: No tracheal deviation.  Cardiovascular:     Rate and Rhythm: Normal rate and regular rhythm.     Heart sounds: Normal heart sounds. No murmur.  Pulmonary:     Effort: Pulmonary effort is normal. No respiratory distress.     Breath sounds: Normal breath sounds.  Lymphadenopathy:     Cervical: No cervical adenopathy.  Skin:    General: Skin is warm and dry.  Neurological:     Mental Status: He is alert.     Coordination: Coordination normal.  Psychiatric:        Behavior: Behavior normal.           Assessment & Plan:  1. Insomnia, unspecified type Stress related Reduce and stop caffeine's as much as possible none after lunch  2. Essential hypertension Blood pressure good control continue current measures watch diet - Basic metabolic panel  3. Prediabetes Prediabetes recheck A1c in 4 months with follow-up minimize starches in the diet - Hemoglobin A1c  4. Other hyperlipidemia Cholesterol looks bad very important for him to do better with diet repeat again in 4 months if not better start medication we did talk about adding medicine today but he would prefer to try dietary measures first - Lipid panel  5. High risk medication use Labs ordered - Hepatic function panel  Mild depression worsening condition will need adjustment of medicine related issues bump up the dose of sertraline.  Verbal contract with the patient to notify us and come in and call immediately if he starts feeling suicidal  Phone follow-up in 3 weeks  Office visit in 4 months

## 2019-12-17 NOTE — Patient Instructions (Addendum)
DASH Eating Plan DASH stands for "Dietary Approaches to Stop Hypertension." The DASH eating plan is a healthy eating plan that has been shown to reduce high blood pressure (hypertension). It may also reduce your risk for type 2 diabetes, heart disease, and stroke. The DASH eating plan may also help with weight loss. What are tips for following this plan?  General guidelines  Avoid eating more than 2,300 mg (milligrams) of salt (sodium) a day. If you have hypertension, you may need to reduce your sodium intake to 1,500 mg a day.  Limit alcohol intake to no more than 1 drink a day for nonpregnant women and 2 drinks a day for men. One drink equals 12 oz of beer, 5 oz of wine, or 1 oz of hard liquor.  Work with your health care provider to maintain a healthy body weight or to lose weight. Ask what an ideal weight is for you.  Get at least 30 minutes of exercise that causes your heart to beat faster (aerobic exercise) most days of the week. Activities may include walking, swimming, or biking.  Work with your health care provider or diet and nutrition specialist (dietitian) to adjust your eating plan to your individual calorie needs. Reading food labels   Check food labels for the amount of sodium per serving. Choose foods with less than 5 percent of the Daily Value of sodium. Generally, foods with less than 300 mg of sodium per serving fit into this eating plan.  To find whole grains, look for the word "whole" as the first word in the ingredient list. Shopping  Buy products labeled as "low-sodium" or "no salt added."  Buy fresh foods. Avoid canned foods and premade or frozen meals. Cooking  Avoid adding salt when cooking. Use salt-free seasonings or herbs instead of table salt or sea salt. Check with your health care provider or pharmacist before using salt substitutes.  Do not fry foods. Cook foods using healthy methods such as baking, boiling, grilling, and broiling instead.  Cook with  heart-healthy oils, such as olive, canola, soybean, or sunflower oil. Meal planning  Eat a balanced diet that includes: ? 5 or more servings of fruits and vegetables each day. At each meal, try to fill half of your plate with fruits and vegetables. ? Up to 6-8 servings of whole grains each day. ? Less than 6 oz of lean meat, poultry, or fish each day. A 3-oz serving of meat is about the same size as a deck of cards. One egg equals 1 oz. ? 2 servings of low-fat dairy each day. ? A serving of nuts, seeds, or beans 5 times each week. ? Heart-healthy fats. Healthy fats called Omega-3 fatty acids are found in foods such as flaxseeds and coldwater fish, like sardines, salmon, and mackerel.  Limit how much you eat of the following: ? Canned or prepackaged foods. ? Food that is high in trans fat, such as fried foods. ? Food that is high in saturated fat, such as fatty meat. ? Sweets, desserts, sugary drinks, and other foods with added sugar. ? Full-fat dairy products.  Do not salt foods before eating.  Try to eat at least 2 vegetarian meals each week.  Eat more home-cooked food and less restaurant, buffet, and fast food.  When eating at a restaurant, ask that your food be prepared with less salt or no salt, if possible. What foods are recommended? The items listed may not be a complete list. Talk with your dietitian about   what dietary choices are best for you. Grains Whole-grain or whole-wheat bread. Whole-grain or whole-wheat pasta. Brown rice. Oatmeal. Quinoa. Bulgur. Whole-grain and low-sodium cereals. Pita bread. Low-fat, low-sodium crackers. Whole-wheat flour tortillas. Vegetables Fresh or frozen vegetables (raw, steamed, roasted, or grilled). Low-sodium or reduced-sodium tomato and vegetable juice. Low-sodium or reduced-sodium tomato sauce and tomato paste. Low-sodium or reduced-sodium canned vegetables. Fruits All fresh, dried, or frozen fruit. Canned fruit in natural juice (without  added sugar). Meat and other protein foods Skinless chicken or turkey. Ground chicken or turkey. Pork with fat trimmed off. Fish and seafood. Egg whites. Dried beans, peas, or lentils. Unsalted nuts, nut butters, and seeds. Unsalted canned beans. Lean cuts of beef with fat trimmed off. Low-sodium, lean deli meat. Dairy Low-fat (1%) or fat-free (skim) milk. Fat-free, low-fat, or reduced-fat cheeses. Nonfat, low-sodium ricotta or cottage cheese. Low-fat or nonfat yogurt. Low-fat, low-sodium cheese. Fats and oils Soft margarine without trans fats. Vegetable oil. Low-fat, reduced-fat, or light mayonnaise and salad dressings (reduced-sodium). Canola, safflower, olive, soybean, and sunflower oils. Avocado. Seasoning and other foods Herbs. Spices. Seasoning mixes without salt. Unsalted popcorn and pretzels. Fat-free sweets. What foods are not recommended? The items listed may not be a complete list. Talk with your dietitian about what dietary choices are best for you. Grains Baked goods made with fat, such as croissants, muffins, or some breads. Dry pasta or rice meal packs. Vegetables Creamed or fried vegetables. Vegetables in a cheese sauce. Regular canned vegetables (not low-sodium or reduced-sodium). Regular canned tomato sauce and paste (not low-sodium or reduced-sodium). Regular tomato and vegetable juice (not low-sodium or reduced-sodium). Pickles. Olives. Fruits Canned fruit in a light or heavy syrup. Fried fruit. Fruit in cream or butter sauce. Meat and other protein foods Fatty cuts of meat. Ribs. Fried meat. Bacon. Sausage. Bologna and other processed lunch meats. Salami. Fatback. Hotdogs. Bratwurst. Salted nuts and seeds. Canned beans with added salt. Canned or smoked fish. Whole eggs or egg yolks. Chicken or turkey with skin. Dairy Whole or 2% milk, cream, and half-and-half. Whole or full-fat cream cheese. Whole-fat or sweetened yogurt. Full-fat cheese. Nondairy creamers. Whipped toppings.  Processed cheese and cheese spreads. Fats and oils Butter. Stick margarine. Lard. Shortening. Ghee. Bacon fat. Tropical oils, such as coconut, palm kernel, or palm oil. Seasoning and other foods Salted popcorn and pretzels. Onion salt, garlic salt, seasoned salt, table salt, and sea salt. Worcestershire sauce. Tartar sauce. Barbecue sauce. Teriyaki sauce. Soy sauce, including reduced-sodium. Steak sauce. Canned and packaged gravies. Fish sauce. Oyster sauce. Cocktail sauce. Horseradish that you find on the shelf. Ketchup. Mustard. Meat flavorings and tenderizers. Bouillon cubes. Hot sauce and Tabasco sauce. Premade or packaged marinades. Premade or packaged taco seasonings. Relishes. Regular salad dressings. Where to find more information:  National Heart, Lung, and Blood Institute: www.nhlbi.nih.gov  American Heart Association: www.heart.org Summary  The DASH eating plan is a healthy eating plan that has been shown to reduce high blood pressure (hypertension). It may also reduce your risk for type 2 diabetes, heart disease, and stroke.  With the DASH eating plan, you should limit salt (sodium) intake to 2,300 mg a day. If you have hypertension, you may need to reduce your sodium intake to 1,500 mg a day.  When on the DASH eating plan, aim to eat more fresh fruits and vegetables, whole grains, lean proteins, low-fat dairy, and heart-healthy fats.  Work with your health care provider or diet and nutrition specialist (dietitian) to adjust your eating plan to your   individual calorie needs. This information is not intended to replace advice given to you by your health care provider. Make sure you discuss any questions you have with your health care provider. Document Revised: 08/16/2017 Document Reviewed: 08/27/2016 Elsevier Patient Education  2020 [employer]   Diabetes Mellitus and Nutrition, Adult When you have diabetes (diabetes mellitus), it is very important to have healthy eating  habits because your blood sugar (glucose) levels are greatly affected by what you eat and drink. Eating healthy foods in the appropriate amounts, at about the same times every day, can help you:  Control your blood glucose.  Lower your risk of heart disease.  Improve your blood pressure.  Reach or maintain a healthy weight. Every person with diabetes is different, and each person has different needs for a meal plan. Your health care provider may recommend that you work with a diet and nutrition specialist (dietitian) to make a meal plan that is best for you. Your meal plan may vary depending on factors such as:  The calories you need.  The medicines you take.  Your weight.  Your blood glucose, blood pressure, and cholesterol levels.  Your activity level.  Other health conditions you have, such as heart or kidney disease. How do carbohydrates affect me? Carbohydrates, also called carbs, affect your blood glucose level more than any other type of food. Eating carbs naturally raises the amount of glucose in your blood. Carb counting is a method for keeping track of how many carbs you eat. Counting carbs is important to keep your blood glucose at a healthy level, especially if you use insulin or take certain oral diabetes medicines. It is important to know how many carbs you can safely have in each meal. This is different for every person. Your dietitian can help you calculate how many carbs you should have at each meal and for each snack. Foods that contain carbs include:  Bread, cereal, rice, pasta, and crackers.  Potatoes and corn.  Peas, beans, and lentils.  Milk and yogurt.  Fruit and juice.  Desserts, such as cakes, cookies, ice cream, and candy. How does alcohol affect me? Alcohol can cause a sudden decrease in blood glucose (hypoglycemia), especially if you use insulin or take certain oral diabetes medicines. Hypoglycemia can be a life-threatening condition. Symptoms of  hypoglycemia (sleepiness, dizziness, and confusion) are similar to symptoms of having too much alcohol. If your health care provider says that alcohol is safe for you, follow these guidelines:  Limit alcohol intake to no more than 1 drink per day for nonpregnant women and 2 drinks per day for men. One drink equals 12 oz of beer, 5 oz of wine, or 1 oz of hard liquor.  Do not drink on an empty stomach.  Keep yourself hydrated with water, diet soda, or unsweetened iced tea.  Keep in mind that regular soda, juice, and other mixers may contain a lot of sugar and must be counted as carbs. What are tips for following this plan?  Reading food labels  Start by checking the serving size on the "Nutrition Facts" label of packaged foods and drinks. The amount of calories, carbs, fats, and other nutrients listed on the label is based on one serving of the item. Many items contain more than one serving per package.  Check the total grams (g) of carbs in one serving. You can calculate the number of servings of carbs in one serving by dividing the total carbs by 15. For example, if   a food has 30 g of total carbs, it would be equal to 2 servings of carbs.  Check the number of grams (g) of saturated and trans fats in one serving. Choose foods that have low or no amount of these fats.  Check the number of milligrams (mg) of salt (sodium) in one serving. Most people should limit total sodium intake to less than 2,300 mg per day.  Always check the nutrition information of foods labeled as "low-fat" or "nonfat". These foods may be higher in added sugar or refined carbs and should be avoided.  Talk to your dietitian to identify your daily goals for nutrients listed on the label. Shopping  Avoid buying canned, premade, or processed foods. These foods tend to be high in fat, sodium, and added sugar.  Shop around the outside edge of the grocery store. This includes fresh fruits and vegetables, bulk grains, fresh  meats, and fresh dairy. Cooking  Use low-heat cooking methods, such as baking, instead of high-heat cooking methods like deep frying.  Cook using healthy oils, such as olive, canola, or sunflower oil.  Avoid cooking with butter, cream, or high-fat meats. Meal planning  Eat meals and snacks regularly, preferably at the same times every day. Avoid going long periods of time without eating.  Eat foods high in fiber, such as fresh fruits, vegetables, beans, and whole grains. Talk to your dietitian about how many servings of carbs you can eat at each meal.  Eat 4-6 ounces (oz) of lean protein each day, such as lean meat, chicken, fish, eggs, or tofu. One oz of lean protein is equal to: ? 1 oz of meat, chicken, or fish. ? 1 egg. ?  cup of tofu.  Eat some foods each day that contain healthy fats, such as avocado, nuts, seeds, and fish. Lifestyle  Check your blood glucose regularly.  Exercise regularly as told by your health care provider. This may include: ? 150 minutes of moderate-intensity or vigorous-intensity exercise each week. This could be brisk walking, biking, or water aerobics. ? Stretching and doing strength exercises, such as yoga or weightlifting, at least 2 times a week.  Take medicines as told by your health care provider.  Do not use any products that contain nicotine or tobacco, such as cigarettes and e-cigarettes. If you need help quitting, ask your health care provider.  Work with a counselor or diabetes educator to identify strategies to manage stress and any emotional and social challenges. Questions to ask a health care provider  Do I need to meet with a diabetes educator?  Do I need to meet with a dietitian?  What number can I call if I have questions?  When are the best times to check my blood glucose? Where to find more information:  American Diabetes Association: diabetes.org  Academy of Nutrition and Dietetics: www.eatright.org  National Institute  of Diabetes and Digestive and Kidney Diseases (NIH): www.niddk.nih.gov Summary  A healthy meal plan will help you control your blood glucose and maintain a healthy lifestyle.  Working with a diet and nutrition specialist (dietitian) can help you make a meal plan that is best for you.  Keep in mind that carbohydrates (carbs) and alcohol have immediate effects on your blood glucose levels. It is important to count carbs and to use alcohol carefully. This information is not intended to replace advice given to you by your health care provider. Make sure you discuss any questions you have with your health care provider. Document Revised: 08/16/2017 Document   Elsevier Patient Education  [employer].

## 2019-12-21 ENCOUNTER — Ambulatory Visit: Payer: BC Managed Care – PPO | Admitting: Family Medicine

## 2019-12-30 ENCOUNTER — Ambulatory Visit
Admission: RE | Admit: 2019-12-30 | Discharge: 2019-12-30 | Disposition: A | Discharge status: Home / Self Care | Payer: BC Managed Care – PPO | Source: Ambulatory Visit | Attending: Student | Admitting: Student

## 2019-12-30 ENCOUNTER — Other Ambulatory Visit: Payer: Self-pay

## 2019-12-30 DIAGNOSIS — M5126 Other intervertebral disc displacement, lumbar region: Secondary | ICD-10-CM | POA: Diagnosis not present

## 2019-12-30 DIAGNOSIS — S12501A Unspecified nondisplaced fracture of sixth cervical vertebra, initial encounter for closed fracture: Secondary | ICD-10-CM | POA: Insufficient documentation

## 2019-12-30 DIAGNOSIS — M4312 Spondylolisthesis, cervical region: Secondary | ICD-10-CM | POA: Diagnosis not present

## 2020-01-05 DIAGNOSIS — S12501A Unspecified nondisplaced fracture of sixth cervical vertebra, initial encounter for closed fracture: Secondary | ICD-10-CM | POA: Diagnosis not present

## 2020-01-07 ENCOUNTER — Other Ambulatory Visit: Payer: Self-pay

## 2020-01-07 ENCOUNTER — Telehealth (INDEPENDENT_AMBULATORY_CARE_PROVIDER_SITE_OTHER): Payer: BC Managed Care – PPO | Admitting: Family Medicine

## 2020-01-07 ENCOUNTER — Telehealth: Payer: Self-pay | Admitting: *Deleted

## 2020-01-07 DIAGNOSIS — F411 Generalized anxiety disorder: Secondary | ICD-10-CM

## 2020-01-07 DIAGNOSIS — R7303 Prediabetes: Secondary | ICD-10-CM

## 2020-01-07 DIAGNOSIS — G4700 Insomnia, unspecified: Secondary | ICD-10-CM | POA: Diagnosis not present

## 2020-01-07 DIAGNOSIS — K6289 Other specified diseases of anus and rectum: Secondary | ICD-10-CM | POA: Diagnosis not present

## 2020-01-07 DIAGNOSIS — E7849 Other hyperlipidemia: Secondary | ICD-10-CM

## 2020-01-07 NOTE — Progress Notes (Signed)
   Subjective:    Patient ID: Steven Gallagher, male    DOB: 1975/11/06, 44 y.o.   MRN: [id number]  HPI  Patient calls today for follow up on insomnia and depression. He states his moods are doing better.  Denies being severely depressed Feels like he is heading in the right direction Staying away from Tylenol PM Occasional headaches at night but no vomiting Patient had normal CT of the head Patient states he is doing better, he cut back on the caffeine and is sleeping better but waking up with headaches.  Patient also concerned about blood on tissue after having BM, history of hemorrhoids.  Patient had a colonoscopy 2018 his had hemorrhoid banding Virtual Visit via Video Note  I connected with Sena Slate on 01/07/20 at 10:00 AM EDT by a video enabled telemedicine application and verified that I am speaking with the correct person using two identifiers.  Location: Patient: home Provider: office   I discussed the limitations of evaluation and management by telemedicine and the availability of in person appointments. The patient expressed understanding and agreed to proceed.  History of Present Illness:    Observations/Objective:   Assessment and Plan:   Follow Up Instructions:    I discussed the assessment and treatment plan with the patient. The patient was provided an opportunity to ask questions and all were answered. The patient agreed with the plan and demonstrated an understanding of the instructions.   The patient was advised to call back or seek an in-person evaluation if the symptoms worsen or if the condition fails to improve as anticipated.  I provided 20 minutes of non-face-to-face time during this encounter.  Review of Systems Denies out \ right rectal bleeding    Objective:   Physical Exam  Lungs clear respiratory normal heart regular no murmur abdomen soft rectal exam heme-negative no problems seen      Assessment & Plan:  Depression doing better  continue current measures Sleep is improving Rectal irritation Patient does relate some blood on wiping but not down in the bowel movement itself patient is followed by gastroenterology.  Denies any major setbacks otherwise Follow-up within 6 months We will send a copy of this to GI so they will be aware if they feel patient needs to be seen sooner then they will connect with patient

## 2020-01-07 NOTE — Patient Instructions (Signed)
Results for orders placed or performed in visit on 11/25/19  Lipid panel  Result Value Ref Range   Cholesterol, Total 262 (H) 100 - 199 mg/dL   Triglycerides 236 (H) 0 - 149 mg/dL   HDL 41 >39 mg/dL   VLDL Cholesterol Cal 45 (H) 5 - 40 mg/dL   LDL Chol Calc (NIH) 176 (H) 0 - 99 mg/dL   Chol/HDL Ratio 6.4 (H) 0.0 - 5.0 ratio  Hepatic function panel  Result Value Ref Range   Total Protein 7.5 6.0 - 8.5 g/dL   Albumin 4.5 4.0 - 5.0 g/dL   Bilirubin Total 0.3 0.0 - 1.2 mg/dL   Bilirubin, Direct 0.09 0.00 - 0.40 mg/dL   Alkaline Phosphatase 97 39 - 117 IU/L   AST 17 0 - 40 IU/L   ALT 17 0 - 44 IU/L  Basic metabolic panel  Result Value Ref Range   Glucose 105 (H) 65 - 99 mg/dL   BUN 10 6 - 24 mg/dL   Creatinine, Ser 1.34 (H) 0.76 - 1.27 mg/dL   GFR calc non Af Amer 65 >59 mL/min/1.73   GFR calc Af Amer 75 >59 mL/min/1.73   BUN/Creatinine Ratio 7 (L) 9 - 20   Sodium 144 134 - 144 mmol/L   Potassium 4.7 3.5 - 5.2 mmol/L   Chloride 103 96 - 106 mmol/L   CO2 24 20 - 29 mmol/L   Calcium 9.8 8.7 - 10.2 mg/dL  CBC with Differential/Platelet  Result Value Ref Range   WBC 7.0 3.4 - 10.8 x10E3/uL   RBC 5.12 4.14 - 5.80 x10E6/uL   Hemoglobin 13.6 13.0 - 17.7 g/dL   Hematocrit 41.3 37.5 - 51.0 %   MCV 81 79 - 97 fL   MCH 26.6 26.6 - 33.0 pg   MCHC 32.9 31.5 - 35.7 g/dL   RDW 14.0 11.6 - 15.4 %   Platelets 289 150 - 450 x10E3/uL   Neutrophils 52 Not Estab. %   Lymphs 37 Not Estab. %   Monocytes 6 Not Estab. %   Eos 3 Not Estab. %   Basos 1 Not Estab. %   Neutrophils Absolute 3.6 1.4 - 7.0 x10E3/uL   Lymphocytes Absolute 2.6 0.7 - 3.1 x10E3/uL   Monocytes Absolute 0.5 0.1 - 0.9 x10E3/uL   EOS (ABSOLUTE) 0.2 0.0 - 0.4 x10E3/uL   Basophils Absolute 0.1 0.0 - 0.2 x10E3/uL   Immature Granulocytes 1 Not Estab. %   Immature Grans (Abs) 0.1 0.0 - 0.1 x10E3/uL  HIV Antibody (routine testing w rflx)  Result Value Ref Range   HIV Screen 4th Generation wRfx Non Reactive Non Reactive    Hemoglobin A1c  Result Value Ref Range   Hgb A1c MFr Bld 6.2 (H) 4.8 - 5.6 %   Est. average glucose Bld gHb Est-mCnc 131 mg/dL    Diabetes Mellitus and Nutrition, Adult When you have diabetes (diabetes mellitus), it is very important to have healthy eating habits because your blood sugar (glucose) levels are greatly affected by what you eat and drink. Eating healthy foods in the appropriate amounts, at about the same times every day, can help you:  Control your blood glucose.  Lower your risk of heart disease.  Improve your blood pressure.  Reach or maintain a healthy weight. Every person with diabetes is different, and each person has different needs for a meal plan. Your health care provider may recommend that you work with a diet and nutrition specialist (dietitian) to make a meal plan that  is best for you. Your meal plan may vary depending on factors such as:  The calories you need.  The medicines you take.  Your weight.  Your blood glucose, blood pressure, and cholesterol levels.  Your activity level.  Other health conditions you have, such as heart or kidney disease. How do carbohydrates affect me? Carbohydrates, also called carbs, affect your blood glucose level more than any other type of food. Eating carbs naturally raises the amount of glucose in your blood. Carb counting is a method for keeping track of how many carbs you eat. Counting carbs is important to keep your blood glucose at a healthy level, especially if you use insulin or take certain oral diabetes medicines. It is important to know how many carbs you can safely have in each meal. This is different for every person. Your dietitian can help you calculate how many carbs you should have at each meal and for each snack. Foods that contain carbs include:  Bread, cereal, rice, pasta, and crackers.  Potatoes and corn.  Peas, beans, and lentils.  Milk and yogurt.  Fruit and juice.  Desserts, such as cakes,  cookies, ice cream, and candy. How does alcohol affect me? Alcohol can cause a sudden decrease in blood glucose (hypoglycemia), especially if you use insulin or take certain oral diabetes medicines. Hypoglycemia can be a life-threatening condition. Symptoms of hypoglycemia (sleepiness, dizziness, and confusion) are similar to symptoms of having too much alcohol. If your health care provider says that alcohol is safe for you, follow these guidelines:  Limit alcohol intake to no more than 1 drink per day for nonpregnant women and 2 drinks per day for men. One drink equals 12 oz of beer, 5 oz of wine, or 1 oz of hard liquor.  Do not drink on an empty stomach.  Keep yourself hydrated with water, diet soda, or unsweetened iced tea.  Keep in mind that regular soda, juice, and other mixers may contain a lot of sugar and must be counted as carbs. What are tips for following this plan?  Reading food labels  Start by checking the serving size on the "Nutrition Facts" label of packaged foods and drinks. The amount of calories, carbs, fats, and other nutrients listed on the label is based on one serving of the item. Many items contain more than one serving per package.  Check the total grams (g) of carbs in one serving. You can calculate the number of servings of carbs in one serving by dividing the total carbs by 15. For example, if a food has 30 g of total carbs, it would be equal to 2 servings of carbs.  Check the number of grams (g) of saturated and trans fats in one serving. Choose foods that have low or no amount of these fats.  Check the number of milligrams (mg) of salt (sodium) in one serving. Most people should limit total sodium intake to less than 2,300 mg per day.  Always check the nutrition information of foods labeled as "low-fat" or "nonfat". These foods may be higher in added sugar or refined carbs and should be avoided.  Talk to your dietitian to identify your daily goals for  nutrients listed on the label. Shopping  Avoid buying canned, premade, or processed foods. These foods tend to be high in fat, sodium, and added sugar.  Shop around the outside edge of the grocery store. This includes fresh fruits and vegetables, bulk grains, fresh meats, and fresh dairy. Cooking  Use  low-heat cooking methods, such as baking, instead of high-heat cooking methods like deep frying.  Cook using healthy oils, such as olive, canola, or sunflower oil.  Avoid cooking with butter, cream, or high-fat meats. Meal planning  Eat meals and snacks regularly, preferably at the same times every day. Avoid going long periods of time without eating.  Eat foods high in fiber, such as fresh fruits, vegetables, beans, and whole grains. Talk to your dietitian about how many servings of carbs you can eat at each meal.  Eat 4-6 ounces (oz) of lean protein each day, such as lean meat, chicken, fish, eggs, or tofu. One oz of lean protein is equal to: ? 1 oz of meat, chicken, or fish. ? 1 egg. ?  cup of tofu.  Eat some foods each day that contain healthy fats, such as avocado, nuts, seeds, and fish. Lifestyle  Check your blood glucose regularly.  Exercise regularly as told by your health care provider. This may include: ? 150 minutes of moderate-intensity or vigorous-intensity exercise each week. This could be brisk walking, biking, or water aerobics. ? Stretching and doing strength exercises, such as yoga or weightlifting, at least 2 times a week.  Take medicines as told by your health care provider.  Do not use any products that contain nicotine or tobacco, such as cigarettes and e-cigarettes. If you need help quitting, ask your health care provider.  Work with a Social worker or diabetes educator to identify strategies to manage stress and any emotional and social challenges. Questions to ask a health care provider  Do I need to meet with a diabetes educator?  Do I need to meet with a  dietitian?  What number can I call if I have questions?  When are the best times to check my blood glucose? Where to find more information:  American Diabetes Association: diabetes.org  Academy of Nutrition and Dietetics: www.eatright.[employer] of Diabetes and Digestive and Kidney Diseases (NIH): [employer] Summary  A healthy meal plan will help you control your blood glucose and maintain a healthy lifestyle.  Working with a diet and nutrition specialist (dietitian) can help you make a meal plan that is best for you.  Keep in mind that carbohydrates (carbs) and alcohol have immediate effects on your blood glucose levels. It is important to count carbs and to use alcohol carefully. This information is not intended to replace advice given to you by your health care provider. Make sure you discuss any questions you have with your health care provider. Document Revised: 08/16/2017 Document Reviewed: 10/08/2016 Elsevier Patient Education  2020 [employer].

## 2020-01-07 NOTE — Telephone Encounter (Signed)
[name], killey are scheduled for a virtual visit with your provider today.    Just as we do with appointments in the office, we must obtain your consent to participate.  Your consent will be active for this visit and any virtual visit you may have with one of our providers in the next 365 days.    If you have a MyChart account, I can also send a copy of this consent to you electronically.  All virtual visits are billed to your insurance company just like a traditional visit in the office.  As this is a virtual visit, video technology does not allow for your provider to perform a traditional examination.  This may limit your provider's ability to fully assess your condition.  If your provider identifies any concerns that need to be evaluated in person or the need to arrange testing such as labs, EKG, etc, we will make arrangements to do so.    Although advances in technology are sophisticated, we cannot ensure that it will always work on either your end or our end.  If the connection with a video visit is poor, we may have to switch to a telephone visit.  With either a video or telephone visit, we are not always able to ensure that we have a secure connection.   I need to obtain your verbal consent now.   Are you willing to proceed with your visit today?   Steven Gallagher has provided verbal consent on 01/07/2020 for a virtual visit (video or telephone).   Patsy Lager, LPN [id number]  [id number]

## 2020-01-11 ENCOUNTER — Telehealth: Payer: Self-pay | Admitting: Gastroenterology

## 2020-01-11 NOTE — Telephone Encounter (Signed)
Patient was on schedule for tomorrow and cancelled

## 2020-01-11 NOTE — Telephone Encounter (Signed)
Steven Gallagher, please have patient follow-up with me in the next few weeks for rectal bleeding.

## 2020-01-12 ENCOUNTER — Ambulatory Visit: Payer: BC Managed Care – PPO | Admitting: Gastroenterology

## 2020-01-30 ENCOUNTER — Ambulatory Visit: Payer: BC Managed Care – PPO | Attending: Internal Medicine

## 2020-01-30 DIAGNOSIS — Z23 Encounter for immunization: Secondary | ICD-10-CM

## 2020-01-30 NOTE — Progress Notes (Signed)
   Covid-19 Vaccination Clinic  Name:  Steven Gallagher    MRN: [id number] DOB: 08-04-1976  01/30/2020  [name] was observed post Covid-19 immunization for 15 minutes without incident. He was provided with Vaccine Information Sheet and instruction to access the V-Safe system.   [name] was instructed to call 911 with any severe reactions post vaccine: Marland Kitchen Difficulty breathing  . Swelling of face and throat  . A fast heartbeat  . A bad rash all over body  . Dizziness and weakness   Immunizations Administered    Name Date Dose VIS Date Route   Pfizer COVID-19 Vaccine 01/30/2020 11:57 AM 0.3 mL 11/11/2018 Intramuscular   Manufacturer: [place]   Lot: [id number]   [place]: [id number]

## 2020-02-02 ENCOUNTER — Other Ambulatory Visit: Payer: Self-pay | Admitting: Family Medicine

## 2020-02-02 DIAGNOSIS — S12501A Unspecified nondisplaced fracture of sixth cervical vertebra, initial encounter for closed fracture: Secondary | ICD-10-CM | POA: Diagnosis not present

## 2020-02-08 NOTE — Telephone Encounter (Signed)
Noted. Please arrange appt in next few weeks.

## 2020-02-09 ENCOUNTER — Encounter: Payer: Self-pay | Admitting: Internal Medicine

## 2020-02-09 NOTE — Telephone Encounter (Signed)
The next appointment with any app is July 9th with Magda Paganini, your next appointment is Friday July 2.

## 2020-02-09 NOTE — Telephone Encounter (Signed)
May be a non-urgent visit with me.

## 2020-02-09 NOTE — Telephone Encounter (Signed)
SCHEDULED PATIENT AND SENT LETTER  °

## 2020-03-24 ENCOUNTER — Ambulatory Visit (INDEPENDENT_AMBULATORY_CARE_PROVIDER_SITE_OTHER): Payer: BC Managed Care – PPO | Admitting: Nurse Practitioner

## 2020-03-24 ENCOUNTER — Other Ambulatory Visit: Payer: Self-pay

## 2020-03-24 ENCOUNTER — Encounter: Payer: Self-pay | Admitting: Nurse Practitioner

## 2020-03-24 VITALS — BP 132/88 | HR 80 | Temp 97.1°F | Ht 68.0 in | Wt 176.2 lb

## 2020-03-24 DIAGNOSIS — K5900 Constipation, unspecified: Secondary | ICD-10-CM | POA: Diagnosis not present

## 2020-03-24 DIAGNOSIS — I10 Essential (primary) hypertension: Secondary | ICD-10-CM | POA: Diagnosis not present

## 2020-03-24 DIAGNOSIS — K641 Second degree hemorrhoids: Secondary | ICD-10-CM | POA: Diagnosis not present

## 2020-03-24 DIAGNOSIS — R7303 Prediabetes: Secondary | ICD-10-CM | POA: Diagnosis not present

## 2020-03-24 DIAGNOSIS — Z79899 Other long term (current) drug therapy: Secondary | ICD-10-CM | POA: Diagnosis not present

## 2020-03-24 DIAGNOSIS — K921 Melena: Secondary | ICD-10-CM | POA: Diagnosis not present

## 2020-03-24 DIAGNOSIS — E7849 Other hyperlipidemia: Secondary | ICD-10-CM | POA: Diagnosis not present

## 2020-03-24 NOTE — Assessment & Plan Note (Signed)
Some persistent constipation despite incorporating fiber back into his diet.  He admits that he does sit on the commode generally for 10 to 15 minutes and knows he needs to reduce this.  He states he often gets distracted because he brings his phone with him.  I recommended he leave his phone in another room, set up time or if he needs to to alert him when it is been 5 minutes.  Discussed the role between prolonged sitting on the commode as well as constipation to hemorrhoids.  He states he will try to do this.  Also recommended he continue to incorporate fiber and ensure he is drinking adequate water.  Follow-up in 6 months.  Call for any worsening symptoms before then.

## 2020-03-24 NOTE — Assessment & Plan Note (Signed)
His hemorrhoid related hematochezia has improved significantly since reincorporating fiber back into his diet.  Recommend he ensure adequate water as well.  Working on toilet time limitation to help prevent further hemorrhoid issues.  He states recently his hematochezia has declined significantly and only sees a "speck" every now and then.  He does have Anusol cream but has not needed it in some time.  Recommend he continue to monitor, notify us of any worsening bleeding.  We will see him back in 6 months and we can decide if further hemorrhoid banding is justified at that point.

## 2020-03-24 NOTE — Assessment & Plan Note (Signed)
Hemorrhoids previously exacerbated with constipation, prolonged sitting, and adequate fiber.  He has had significant improvements as per below.  His main issue at this point is prolonged sitting and we discussed, as noted below.  He will try to reduce the amount of time he spends on the commode.  Recommend he continue to incorporate fiber and adequate water into his diet to help prevent constipation and worsening hemorrhoid symptoms/hematochezia.  Follow-up in 6 months to decide if further hemorrhoid banding is justified.  If prolonged, persistent symptoms can consider colonoscopy as well.

## 2020-03-24 NOTE — Progress Notes (Signed)
Referring Provider: Kathyrn Drown, MD Primary Care Physician:  Kathyrn Drown, MD Primary GI:  Dr. Gala Romney  Chief Complaint  Patient presents with  . Rectal Bleeding    occas, straining at times    HPI:   Steven Gallagher is a 44 y.o. male who presents for follow-up.  The patient was last seen in our office 10/13/2019 for hematochezia.  History of symptomatic hemorrhoids status post banding x3 in 2019 and again in neutral position the last visit in September 2020.  Previous colonoscopy 2018.  At his last visit noted some paper hematochezia with every bowel movement, noticed more recently.  [place] 4-5 stools with some straining, trying to limit toilet time but generally on the commode for 8 to 10 minutes.  Bowel movement every other day, has slacked off on fiber.  Some bloating with the fiber but has not tried reduced dose.  Some itching as well, no prolapsing tissue.  Overall symptoms much improved from past.  Recommended incorporate supplemental fiber back in diet, no straining and avoid prolonged toilet time, Anusol, follow-up in 3 to 4 months.  If no improvement, consider banding.  Revisit colonoscopy in the future if continued bleeding.  Today he states he is doing okay overall. He has incorporated fiber back into his diet. Sometimes when he wipes he sees blood, typically about 1-2 per week previously, but lately this has significantly declined with only a "speck" every now and then. He has Anusol cream and Preparation H, but hasn't needed any lately. Has a bowel movement every 3 days or so. If he uses fiber, he has complete emptying. He sits on the commode longer than he should, about 10-12 minutes and "I know I need to cut that back but I get to messing on my phone." Denies itching, irritation, burning. Denies rectal fullness. Denies abdominal pain, N/V, melena, fever, chills, unintentional weight loss. Denies URI or flu-like symptoms. Denies loss of sense of taste or smell. The patient has  received COVID-19 vaccination(s). Denies chest pain, dyspnea, dizziness, lightheadedness, syncope, near syncope. Denies any other upper or lower GI symptoms.  GERD doing well on Protonix.  Past Medical History:  Diagnosis Date  . Dizziness   . Elevated blood pressure   . GERD (gastroesophageal reflux disease)   . Hyperglycemia   . Hyperlipidemia   . Hyperlipidemia 12/17/2019  . Hypertension     Past Surgical History:  Procedure Laterality Date  . BACTERIAL OVERGROWTH TEST N/A 04/22/2015   Procedure: BACTERIAL OVERGROWTH TEST;  Surgeon: Daneil Dolin, MD;  Location: [facility];  Service: Endoscopy;  Laterality: N/A;  0700  . COLONOSCOPY N/A 07/03/2017   dr. Gala Romney: small internal hemorrhoids, otherwise normal  . ESOPHAGOGASTRODUODENOSCOPY N/A 07/03/2017   Dr. Gala Romney: normal esophagus s/p dilation, small hiatal hernia, normal duodenum  . MALONEY DILATION N/A 07/03/2017   Procedure: Venia Minks DILATION;  Surgeon: Daneil Dolin, MD;  Location: [facility];  Service: Endoscopy;  Laterality: N/A;    Current Outpatient Medications  Medication Sig Dispense Refill  . amLODipine (NORVASC) 5 MG tablet Take 1 tablet (5 mg total) by mouth daily. 90 tablet 1  . Fiber POWD Take by mouth daily.    . hydrocortisone (ANUSOL-HC) 2.5 % rectal cream Place 1 application rectally 2 (two) times daily. (Patient taking differently: Place 1 application rectally 2 (two) times daily. As needed) 30 g 1  . pantoprazole (PROTONIX) 40 MG tablet TAKE 1 TABLET BY MOUTH EVERY DAY (Patient taking  differently: as needed. ) 90 tablet 0  . sertraline (ZOLOFT) 100 MG tablet TAKE 1 TABLET BY MOUTH EVERY DAY 90 tablet 0  . valACYclovir (VALTREX) 500 MG tablet TAKE 1 TABLET BY MOUTH EVERY DAY 90 tablet 1   No current facility-administered medications for this visit.    Allergies as of 03/24/2020  . (No Known Allergies)    Family History  Problem Relation Age of Onset  . Hypertension Mother   . Hypertension Father     . Hypertension Brother   . Colon cancer Neg Hx   . Colon polyps Neg Hx     Social History   Socioeconomic History  . Marital status: Married    Spouse name: Not on file  . Number of children: Not on file  . Years of education: Not on file  . Highest education level: Not on file  Occupational History  . Not on file  Tobacco Use  . Smoking status: Never Smoker  . Smokeless tobacco: Never Used  Vaping Use  . Vaping Use: Never used  Substance and Sexual Activity  . Alcohol use: No  . Drug use: No  . Sexual activity: Not on file  Other Topics Concern  . Not on file  Social History Narrative  . Not on file   Social Determinants of Health   Financial Resource Strain:   . Difficulty of Paying Living Expenses:   Food Insecurity:   . Worried About Charity fundraiser in the Last Year:   . Arboriculturist in the Last Year:   Transportation Needs:   . Film/video editor (Medical):   Marland Kitchen Lack of Transportation (Non-Medical):   Physical Activity:   . Days of Exercise per Week:   . Minutes of Exercise per Session:   Stress:   . Feeling of Stress :   Social Connections:   . Frequency of Communication with Friends and Family:   . Frequency of Social Gatherings with Friends and Family:   . Attends Religious Services:   . Active Member of Clubs or Organizations:   . Attends Archivist Meetings:   Marland Kitchen Marital Status:     Subjective: Review of Systems  Constitutional: Negative for chills, fever, malaise/fatigue and weight loss.  HENT: Negative for congestion and sore throat.   Respiratory: Negative for cough and shortness of breath.   Cardiovascular: Negative for chest pain and palpitations.  Gastrointestinal: Negative for abdominal pain, blood in stool, diarrhea, melena, nausea and vomiting.  Musculoskeletal: Negative for joint pain and myalgias.  Skin: Negative for rash.  Neurological: Negative for dizziness and weakness.  Endo/Heme/Allergies: Does not  bruise/bleed easily.  Psychiatric/Behavioral: Negative for depression. The patient is not nervous/anxious.   All other systems reviewed and are negative.    Objective: BP 132/88   Pulse 80   Temp (!) 97.1 F (36.2 C) (Oral)   Ht 5\' 8"  (1.727 m)   Wt 176 lb 3.2 oz (79.9 kg)   BMI 26.79 kg/m  Physical Exam Vitals and nursing note reviewed.  Constitutional:      General: He is not in acute distress.    Appearance: Normal appearance. He is not ill-appearing, toxic-appearing or diaphoretic.  HENT:     Head: Normocephalic and atraumatic.     Nose: No congestion or rhinorrhea.  Eyes:     General: No scleral icterus. Cardiovascular:     Rate and Rhythm: Normal rate and regular rhythm.     Heart sounds: Normal heart  sounds.  Pulmonary:     Effort: Pulmonary effort is normal.     Breath sounds: Normal breath sounds.  Abdominal:     General: Bowel sounds are normal. There is no distension.     Palpations: Abdomen is soft. There is no hepatomegaly, splenomegaly or mass.     Tenderness: There is no abdominal tenderness. There is no guarding or rebound.     Hernia: No hernia is present.  Musculoskeletal:     Cervical back: Neck supple.  Skin:    General: Skin is warm and dry.     Coloration: Skin is not jaundiced.     Findings: No bruising or rash.  Neurological:     General: No focal deficit present.     Mental Status: He is alert and oriented to person, place, and time. Mental status is at baseline.  Psychiatric:        Mood and Affect: Mood normal.        Behavior: Behavior normal.        Thought Content: Thought content normal.       03/24/2020 10:24 AM   Disclaimer: This note was dictated with voice recognition software. Similar sounding words can inadvertently be transcribed and may not be corrected upon review.

## 2020-03-24 NOTE — Patient Instructions (Signed)
Your health issues we discussed today were:   Hemorrhoids with rectal bleeding: 1. As we discussed, continue taking fiber 2. Make sure you are drinking enough water every day 3. Limit toilet time to 5 minutes.  Leave your phone in another room to prevent distraction 4. Call us if you have any worsening or severe bleeding  Overall I recommend:  1. Continue your other current medications 2. Return for follow-up in 6 months 3. Call us if you have any questions or concerns   ---------------------------------------------------------------  I am glad you have gotten your COVID-19 vaccination!  Even though you are fully vaccinated you should continue to follow CDC and state/local guidelines.  ---------------------------------------------------------------   At [facility] we value your feedback. You may receive a survey about your visit today. Please share your experience as we strive to create trusting relationships with our patients to provide genuine, compassionate, quality care.  We appreciate your understanding and patience as we review any laboratory studies, imaging, and other diagnostic tests that are ordered as we care for you. Our office policy is 5 business days for review of these results, and any emergent or urgent results are addressed in a timely manner for your best interest. If you do not hear from our office in 1 week, please contact us.   We also encourage the use of MyChart, which contains your medical information for your review as well. If you are not enrolled in this feature, an access code is on this after visit summary for your convenience. Thank you for allowing [place] to be involved in your care.  It was great to see you today!  I hope you have a great Summer!!

## 2020-03-25 LAB — HEPATIC FUNCTION PANEL
ALT: 27 IU/L (ref 0–44)
AST: 28 IU/L (ref 0–40)
Albumin: 4.6 g/dL (ref 4.0–5.0)
Alkaline Phosphatase: 104 IU/L (ref 48–121)
Bilirubin Total: <0.2 mg/dL (ref 0.0–1.2)
Bilirubin, Direct: 0.05 mg/dL (ref 0.00–0.40)
Total Protein: 7.6 g/dL (ref 6.0–8.5)

## 2020-03-25 LAB — LIPID PANEL
Chol/HDL Ratio: 5.3 ratio — ABNORMAL HIGH (ref 0.0–5.0)
Cholesterol, Total: 278 mg/dL — ABNORMAL HIGH (ref 100–199)
HDL: 52 mg/dL (ref >39)
LDL Chol Calc (NIH): 183 mg/dL — ABNORMAL HIGH (ref 0–99)
Triglycerides: 227 mg/dL — ABNORMAL HIGH (ref 0–149)
VLDL Cholesterol Cal: 43 mg/dL — ABNORMAL HIGH (ref 5–40)

## 2020-03-25 LAB — BASIC METABOLIC PANEL
BUN/Creatinine Ratio: 12 (ref 9–20)
BUN: 13 mg/dL (ref 6–24)
CO2: 26 mmol/L (ref 20–29)
Calcium: 9.5 mg/dL (ref 8.7–10.2)
Chloride: 103 mmol/L (ref 96–106)
Creatinine, Ser: 1.1 mg/dL (ref 0.76–1.27)
GFR calc Af Amer: 95 mL/min/{1.73_m2} (ref >59)
GFR calc non Af Amer: 82 mL/min/{1.73_m2} (ref >59)
Glucose: 106 mg/dL — ABNORMAL HIGH (ref 65–99)
Potassium: 4.6 mmol/L (ref 3.5–5.2)
Sodium: 141 mmol/L (ref 134–144)

## 2020-03-25 LAB — HEMOGLOBIN A1C
Est. average glucose Bld gHb Est-mCnc: 123 mg/dL
Hgb A1c MFr Bld: 5.9 % — ABNORMAL HIGH (ref 4.8–5.6)

## 2020-03-26 ENCOUNTER — Encounter: Payer: Self-pay | Admitting: Family Medicine

## 2020-05-02 ENCOUNTER — Telehealth: Payer: Self-pay | Admitting: Family Medicine

## 2020-05-02 ENCOUNTER — Other Ambulatory Visit: Payer: Self-pay | Admitting: Family Medicine

## 2020-05-02 ENCOUNTER — Other Ambulatory Visit: Payer: Self-pay | Admitting: *Deleted

## 2020-05-02 MED ORDER — SERTRALINE HCL 100 MG PO TABS: 100.0000 mg | ORAL_TABLET | Freq: Every day | ORAL | 0 refills | Status: DC

## 2020-05-02 NOTE — Telephone Encounter (Signed)
[facility] requesting refill on Sertraline 100 mg tablet. Take one tablet po daily. Pt last seen 01/07/20 for anxiety. Please advise. Thank you

## 2020-05-02 NOTE — Telephone Encounter (Signed)
Done

## 2020-05-02 NOTE — Telephone Encounter (Signed)
May have 90-day has follow-up visit in September

## 2020-05-11 ENCOUNTER — Ambulatory Visit: Payer: BC Managed Care – PPO | Admitting: Family Medicine

## 2020-05-25 ENCOUNTER — Encounter: Payer: Self-pay | Admitting: Family Medicine

## 2020-05-25 ENCOUNTER — Ambulatory Visit (INDEPENDENT_AMBULATORY_CARE_PROVIDER_SITE_OTHER): Payer: BC Managed Care – PPO | Admitting: Family Medicine

## 2020-05-25 ENCOUNTER — Other Ambulatory Visit: Payer: Self-pay

## 2020-05-25 VITALS — BP 128/88 | Temp 97.9°F | Ht 68.0 in | Wt 172.8 lb

## 2020-05-25 DIAGNOSIS — I10 Essential (primary) hypertension: Secondary | ICD-10-CM

## 2020-05-25 DIAGNOSIS — E7849 Other hyperlipidemia: Secondary | ICD-10-CM

## 2020-05-25 DIAGNOSIS — R7303 Prediabetes: Secondary | ICD-10-CM | POA: Diagnosis not present

## 2020-05-25 MED ORDER — ROSUVASTATIN CALCIUM 5 MG PO TABS
5.0000 mg | ORAL_TABLET | Freq: Every day | ORAL | 1 refills | Status: DC
Start: 2020-05-25 — End: 2020-09-26

## 2020-05-25 MED ORDER — AMLODIPINE BESYLATE 5 MG PO TABS: 5.0000 mg | ORAL_TABLET | Freq: Every day | ORAL | 1 refills | Status: DC

## 2020-05-25 MED ORDER — VALACYCLOVIR HCL 500 MG PO TABS: 500.0000 mg | ORAL_TABLET | Freq: Every day | ORAL | 1 refills | Status: DC

## 2020-05-25 MED ORDER — SERTRALINE HCL 100 MG PO TABS: 100.0000 mg | ORAL_TABLET | Freq: Every day | ORAL | 1 refills | Status: DC

## 2020-05-25 NOTE — Progress Notes (Signed)
Subjective:    Patient ID: Steven Gallagher, male    DOB: [birth year], 44 y.o.   MRN: [id number]  Hyperlipidemia This is a chronic problem. The current episode started more than 1 year ago. Pertinent negatives include no chest pain or shortness of breath. Risk factors for coronary artery disease include hypertension.  Patient taking his amlodipine on a regular basis watching salt in the diet  Uses Protonix only intermittently and most of the time uses OTC famotidine Moods are doing good taking sertraline on a regular basis.  Discuss recent labs Results for orders placed or performed in visit on 12/17/19  Hemoglobin A1c  Result Value Ref Range   Hgb A1c MFr Bld 5.9 (H) 4.8 - 5.6 %   Est. average glucose Bld gHb Est-mCnc 123 mg/dL  Lipid panel  Result Value Ref Range   Cholesterol, Total 278 (H) 100 - 199 mg/dL   Triglycerides 227 (H) 0 - 149 mg/dL   HDL 52 >39 mg/dL   VLDL Cholesterol Cal 43 (H) 5 - 40 mg/dL   LDL Chol Calc (NIH) 183 (H) 0 - 99 mg/dL   Chol/HDL Ratio 5.3 (H) 0.0 - 5.0 ratio  Hepatic function panel  Result Value Ref Range   Total Protein 7.6 6.0 - 8.5 g/dL   Albumin 4.6 4.0 - 5.0 g/dL   Bilirubin Total <0.2 0.0 - 1.2 mg/dL   Bilirubin, Direct 0.05 0.00 - 0.40 mg/dL   Alkaline Phosphatase 104 48 - 121 IU/L   AST 28 0 - 40 IU/L   ALT 27 0 - 44 IU/L  Basic metabolic panel  Result Value Ref Range   Glucose 106 (H) 65 - 99 mg/dL   BUN 13 6 - 24 mg/dL   Creatinine, Ser 1.10 0.76 - 1.27 mg/dL   GFR calc non Af Amer 82 >59 mL/min/1.73   GFR calc Af Amer 95 >59 mL/min/1.73   BUN/Creatinine Ratio 12 9 - 20   Sodium 141 134 - 144 mmol/L   Potassium 4.6 3.5 - 5.2 mmol/L   Chloride 103 96 - 106 mmol/L   CO2 26 20 - 29 mmol/L   Calcium 9.5 8.7 - 10.2 mg/dL    Review of Systems  Constitutional: Negative for diaphoresis and fatigue.  HENT: Negative for congestion and rhinorrhea.   Respiratory: Negative for cough and shortness of breath.   Cardiovascular: Negative  for chest pain and leg swelling.  Gastrointestinal: Negative for abdominal pain and diarrhea.  Skin: Negative for color change and rash.  Neurological: Negative for dizziness and headaches.  Psychiatric/Behavioral: Negative for behavioral problems and confusion.       Objective:   Physical Exam Vitals reviewed.  Constitutional:      General: He is not in acute distress. HENT:     Head: Normocephalic and atraumatic.  Eyes:     General:        Right eye: No discharge.        Left eye: No discharge.  Neck:     Trachea: No tracheal deviation.  Cardiovascular:     Rate and Rhythm: Normal rate and regular rhythm.     Heart sounds: Normal heart sounds. No murmur heard.   Pulmonary:     Effort: Pulmonary effort is normal. No respiratory distress.     Breath sounds: Normal breath sounds.  Lymphadenopathy:     Cervical: No cervical adenopathy.  Skin:    General: Skin is warm and dry.  Neurological:     Mental Status:  He is alert.     Coordination: Coordination normal.  Psychiatric:        Behavior: Behavior normal.           Assessment & Plan:  1. Essential hypertension Blood pressure good control continue current measures  2. Other hyperlipidemia LDL above 160.  This is persistent despite healthy diet.  Recommend starting Crestor 5 mg every day.  Recheck lipid liver profile in approximately 8 to 12 weeks recheck patient in 6 months  3. Prediabetes Minimize starches in diet exercise recheck again in the spring

## 2020-05-25 NOTE — Patient Instructions (Addendum)
Diabetes Mellitus and Nutrition, Adult When you have diabetes (diabetes mellitus), it is very important to have healthy eating habits because your blood sugar (glucose) levels are greatly affected by what you eat and drink. Eating healthy foods in the appropriate amounts, at about the same times every day, can help you:  Control your blood glucose.  Lower your risk of heart disease.  Improve your blood pressure.  Reach or maintain a healthy weight. Every person with diabetes is different, and each person has different needs for a meal plan. Your health care provider may recommend that you work with a diet and nutrition specialist (dietitian) to make a meal plan that is best for you. Your meal plan may vary depending on factors such as:  The calories you need.  The medicines you take.  Your weight.  Your blood glucose, blood pressure, and cholesterol levels.  Your activity level.  Other health conditions you have, such as heart or kidney disease. How do carbohydrates affect me? Carbohydrates, also called carbs, affect your blood glucose level more than any other type of food. Eating carbs naturally raises the amount of glucose in your blood. Carb counting is a method for keeping track of how many carbs you eat. Counting carbs is important to keep your blood glucose at a healthy level, especially if you use insulin or take certain oral diabetes medicines. It is important to know how many carbs you can safely have in each meal. This is different for every person. Your dietitian can help you calculate how many carbs you should have at each meal and for each snack. Foods that contain carbs include:  Bread, cereal, rice, pasta, and crackers.  Potatoes and corn.  Peas, beans, and lentils.  Milk and yogurt.  Fruit and juice.  Desserts, such as cakes, cookies, ice cream, and candy. How does alcohol affect me? Alcohol can cause a sudden decrease in blood glucose (hypoglycemia),  especially if you use insulin or take certain oral diabetes medicines. Hypoglycemia can be a life-threatening condition. Symptoms of hypoglycemia (sleepiness, dizziness, and confusion) are similar to symptoms of having too much alcohol. If your health care provider says that alcohol is safe for you, follow these guidelines:  Limit alcohol intake to no more than 1 drink per day for nonpregnant women and 2 drinks per day for men. One drink equals 12 oz of beer, 5 oz of wine, or 1 oz of hard liquor.  Do not drink on an empty stomach.  Keep yourself hydrated with water, diet soda, or unsweetened iced tea.  Keep in mind that regular soda, juice, and other mixers may contain a lot of sugar and must be counted as carbs. What are tips for following this plan?  Reading food labels  Start by checking the serving size on the "Nutrition Facts" label of packaged foods and drinks. The amount of calories, carbs, fats, and other nutrients listed on the label is based on one serving of the item. Many items contain more than one serving per package.  Check the total grams (g) of carbs in one serving. You can calculate the number of servings of carbs in one serving by dividing the total carbs by 15. For example, if a food has 30 g of total carbs, it would be equal to 2 servings of carbs.  Check the number of grams (g) of saturated and trans fats in one serving. Choose foods that have low or no amount of these fats.  Check the number of   milligrams (mg) of salt (sodium) in one serving. Most people should limit total sodium intake to less than 2,300 mg per day.  Always check the nutrition information of foods labeled as "low-fat" or "nonfat". These foods may be higher in added sugar or refined carbs and should be avoided.  Talk to your dietitian to identify your daily goals for nutrients listed on the label. Shopping  Avoid buying canned, premade, or processed foods. These foods tend to be high in fat, sodium,  and added sugar.  Shop around the outside edge of the grocery store. This includes fresh fruits and vegetables, bulk grains, fresh meats, and fresh dairy. Cooking  Use low-heat cooking methods, such as baking, instead of high-heat cooking methods like deep frying.  Cook using healthy oils, such as olive, canola, or sunflower oil.  Avoid cooking with butter, cream, or high-fat meats. Meal planning  Eat meals and snacks regularly, preferably at the same times every day. Avoid going long periods of time without eating.  Eat foods high in fiber, such as fresh fruits, vegetables, beans, and whole grains. Talk to your dietitian about how many servings of carbs you can eat at each meal.  Eat 4-6 ounces (oz) of lean protein each day, such as lean meat, chicken, fish, eggs, or tofu. One oz of lean protein is equal to: ? 1 oz of meat, chicken, or fish. ? 1 egg. ?  cup of tofu.  Eat some foods each day that contain healthy fats, such as avocado, nuts, seeds, and fish. Lifestyle  Check your blood glucose regularly.  Exercise regularly as told by your health care provider. This may include: ? 150 minutes of moderate-intensity or vigorous-intensity exercise each week. This could be brisk walking, biking, or water aerobics. ? Stretching and doing strength exercises, such as yoga or weightlifting, at least 2 times a week.  Take medicines as told by your health care provider.  Do not use any products that contain nicotine or tobacco, such as cigarettes and e-cigarettes. If you need help quitting, ask your health care provider.  Work with a Social worker or diabetes educator to identify strategies to manage stress and any emotional and social challenges. Questions to ask a health care provider  Do I need to meet with a diabetes educator?  Do I need to meet with a dietitian?  What number can I call if I have questions?  When are the best times to check my blood glucose? Where to find more  information:  American Diabetes Association: diabetes.org  Academy of Nutrition and Dietetics: www.eatright.[employer] of Diabetes and Digestive and Kidney Diseases (NIH): [employer] Summary  A healthy meal plan will help you control your blood glucose and maintain a healthy lifestyle.  Working with a diet and nutrition specialist (dietitian) can help you make a meal plan that is best for you.  Keep in mind that carbohydrates (carbs) and alcohol have immediate effects on your blood glucose levels. It is important to count carbs and to use alcohol carefully. This information is not intended to replace advice given to you by your health care provider. Make sure you discuss any questions you have with your health care provider. Document Revised: 08/16/2017 Document Reviewed: 10/08/2016 Elsevier Patient Education  [place]. Results for orders placed or performed in visit on 12/17/19  Hemoglobin A1c  Result Value Ref Range   Hgb A1c MFr Bld 5.9 (H) 4.8 - 5.6 %   Est. average glucose Bld gHb Est-mCnc 123  mg/dL  Lipid panel  Result Value Ref Range   Cholesterol, Total 278 (H) 100 - 199 mg/dL   Triglycerides 227 (H) 0 - 149 mg/dL   HDL 52 >39 mg/dL   VLDL Cholesterol Cal 43 (H) 5 - 40 mg/dL   LDL Chol Calc (NIH) 183 (H) 0 - 99 mg/dL   Chol/HDL Ratio 5.3 (H) 0.0 - 5.0 ratio  Hepatic function panel  Result Value Ref Range   Total Protein 7.6 6.0 - 8.5 g/dL   Albumin 4.6 4.0 - 5.0 g/dL   Bilirubin Total <0.2 0.0 - 1.2 mg/dL   Bilirubin, Direct 0.05 0.00 - 0.40 mg/dL   Alkaline Phosphatase 104 48 - 121 IU/L   AST 28 0 - 40 IU/L   ALT 27 0 - 44 IU/L  Basic metabolic panel  Result Value Ref Range   Glucose 106 (H) 65 - 99 mg/dL   BUN 13 6 - 24 mg/dL   Creatinine, Ser 1.10 0.76 - 1.27 mg/dL   GFR calc non Af Amer 82 >59 mL/min/1.73   GFR calc Af Amer 95 >59 mL/min/1.73   BUN/Creatinine Ratio 12 9 - 20   Sodium 141 134 - 144 mmol/L   Potassium 4.6 3.5 -  5.2 mmol/L   Chloride 103 96 - 106 mmol/L   CO2 26 20 - 29 mmol/L   Calcium 9.5 8.7 - 10.2 mg/dL    High-Fiber Diet Fiber, also called dietary fiber, is a type of carbohydrate that is found in fruits, vegetables, whole grains, and beans. A high-fiber diet can have many health benefits. Your health care provider may recommend a high-fiber diet to help:  Prevent constipation. Fiber can make your bowel movements more regular.  Lower your cholesterol.  Relieve the following conditions: ? Swelling of veins in the anus (hemorrhoids). ? Swelling and irritation (inflammation) of specific areas of the digestive tract (uncomplicated diverticulosis). ? A problem of the large intestine (colon) that sometimes causes pain and diarrhea (irritable bowel syndrome, IBS).  Prevent overeating as part of a weight-loss plan.  Prevent heart disease, type 2 diabetes, and certain cancers. What is my plan? The recommended daily fiber intake in grams (g) includes:  38 g for men age 41 or younger.  30 g for men over age 55.  11 g for women age 71 or younger.  21 g for women over age 39. You can get the recommended daily intake of dietary fiber by:  Eating a variety of fruits, vegetables, grains, and beans.  Taking a fiber supplement, if it is not possible to get enough fiber through your diet. What do I need to know about a high-fiber diet?  It is better to get fiber through food sources rather than from fiber supplements. There is not a lot of research about how effective supplements are.  Always check the fiber content on the nutrition facts label of any prepackaged food. Look for foods that contain 5 g of fiber or more per serving.  Talk with a diet and nutrition specialist (dietitian) if you have questions about specific foods that are recommended or not recommended for your medical condition, especially if those foods are not listed below.  Gradually increase how much fiber you consume. If you  increase your intake of dietary fiber too quickly, you may have bloating, cramping, or gas.  Drink plenty of water. Water helps you to digest fiber. What are tips for following this plan?  Eat a wide variety of high-fiber foods.  Make  sure that half of the grains that you eat each day are whole grains.  Eat breads and cereals that are made with whole-grain flour instead of refined flour or white flour.  Eat brown rice, bulgur wheat, or millet instead of white rice.  Start the day with a breakfast that is high in fiber, such as a cereal that contains 5 g of fiber or more per serving.  Use beans in place of meat in soups, salads, and pasta dishes.  Eat high-fiber snacks, such as berries, raw vegetables, nuts, and popcorn.  Choose whole fruits and vegetables instead of processed forms like juice or sauce. What foods can I eat?  Fruits Berries. Pears. Apples. Oranges. Avocado. Prunes and raisins. Dried figs. Vegetables Sweet potatoes. Spinach. Kale. Artichokes. Cabbage. Broccoli. Cauliflower. Green peas. Carrots. Squash. Grains Whole-grain breads. Multigrain cereal. Oats and oatmeal. Brown rice. Barley. Bulgur wheat. Ceylon. Quinoa. Bran muffins. Popcorn. Rye wafer crackers. Meats and other proteins Navy, kidney, and pinto beans. Soybeans. Split peas. Lentils. Nuts and seeds. Dairy Fiber-fortified yogurt. Beverages Fiber-fortified soy milk. Fiber-fortified orange juice. Other foods Fiber bars. The items listed above may not be a complete list of recommended foods and beverages. Contact a dietitian for more options. What foods are not recommended? Fruits Fruit juice. Cooked, strained fruit. Vegetables Fried potatoes. Canned vegetables. Well-cooked vegetables. Grains White bread. Pasta made with refined flour. White rice. Meats and other proteins Fatty cuts of meat. Fried chicken or fried fish. Dairy Milk. Yogurt. Cream cheese. Sour cream. Fats and  oils Butters. Beverages Soft drinks. Other foods Cakes and pastries. The items listed above may not be a complete list of foods and beverages to avoid. Contact a dietitian for more information. Summary  Fiber is a type of carbohydrate. It is found in fruits, vegetables, whole grains, and beans.  There are many health benefits of eating a high-fiber diet, such as preventing constipation, lowering blood cholesterol, helping with weight loss, and reducing your risk of heart disease, diabetes, and certain cancers.  Gradually increase your intake of fiber. Increasing too fast can result in cramping, bloating, and gas. Drink plenty of water while you increase your fiber.  The best sources of fiber include whole fruits and vegetables, whole grains, nuts, seeds, and beans. This information is not intended to replace advice given to you by your health care provider. Make sure you discuss any questions you have with your health care provider. Document Revised: 07/08/2017 Document Reviewed: 07/08/2017 Elsevier Patient Education  2020 [employer].

## 2020-06-24 ENCOUNTER — Encounter: Payer: Self-pay | Admitting: Gastroenterology

## 2020-06-24 ENCOUNTER — Other Ambulatory Visit: Payer: Self-pay

## 2020-06-24 ENCOUNTER — Ambulatory Visit (INDEPENDENT_AMBULATORY_CARE_PROVIDER_SITE_OTHER): Payer: BC Managed Care – PPO | Admitting: Gastroenterology

## 2020-06-24 VITALS — BP 129/86 | HR 82 | Temp 97.2°F | Ht 68.0 in | Wt 175.8 lb

## 2020-06-24 DIAGNOSIS — R140 Abdominal distension (gaseous): Secondary | ICD-10-CM | POA: Insufficient documentation

## 2020-06-24 DIAGNOSIS — K5900 Constipation, unspecified: Secondary | ICD-10-CM

## 2020-06-24 MED ORDER — RIFAXIMIN 550 MG PO TABS
550.0000 mg | ORAL_TABLET | Freq: Three times a day (TID) | ORAL | 0 refills | Status: AC
Start: 2020-06-24 — End: 2020-07-08

## 2020-06-24 NOTE — Patient Instructions (Signed)
I have sent in Xifaxan to take three times a day for 14 days, then stop. This is for the gas/bloating and treatment of bacterial overgrowth.  I recommend increasing the stool softener (docusate sodium) to twice per day.  Please complete the stool test. If this is positive, we will pursue a colonoscopy.   We will see you in 6 months!  I enjoyed seeing you again today! As you know, I value our relationship and want to provide genuine, compassionate, and quality care. I welcome your feedback. If you receive a survey regarding your visit,  I greatly appreciate you taking time to fill this out. See you next time!  [name], PhD, ANP-BC [facility]

## 2020-06-24 NOTE — Progress Notes (Signed)
Cc'ed to pcp °

## 2020-06-24 NOTE — Progress Notes (Signed)
Referring Provider: Kathyrn Drown, MD Primary Care Physician:  Kathyrn Drown, MD  Primary GI: Dr. Gala Romney   Chief Complaint  Patient presents with  . Blood In Stools    started stool softners 3 weeks ago and now he just has some spotting at times    HPI:   Steven Gallagher is a 44 y.o. male presenting today with a history of symptomatic hemorrhoids, s/p banding X 3 in 2019 and then again in neutral position at visit in Sept 2020. Last colonoscopy in 2018.    Scant hematochezia with straining. Noted as pinpoint. He had noted this earlier this year as well. BM once every 2 days. When stool was hard, saw some blood. Stool softener once per day. Has gas issues with fiber and without fiber. Feels like he has to pass a lot of gas. Sometimes stomach discomfort with the gas. Taking gas-x. Feels like he is straining every other time. History of SBBO noted in 2016, receiving Xifaxan at that time. Concerned about hemorrhoid recurrence.   Past Medical History:  Diagnosis Date  . Dizziness   . Elevated blood pressure   . GERD (gastroesophageal reflux disease)   . Hyperglycemia   . Hyperlipidemia   . Hyperlipidemia 12/17/2019  . Hypertension     Past Surgical History:  Procedure Laterality Date  . BACTERIAL OVERGROWTH TEST N/A 04/22/2015   Procedure: BACTERIAL OVERGROWTH TEST;  Surgeon: Daneil Dolin, MD;  Location: [facility];  Service: Endoscopy;  Laterality: N/A;  0700  . COLONOSCOPY N/A 07/03/2017   dr. Gala Romney: small internal hemorrhoids, otherwise normal  . ESOPHAGOGASTRODUODENOSCOPY N/A 07/03/2017   Dr. Gala Romney: normal esophagus s/p dilation, small hiatal hernia, normal duodenum  . MALONEY DILATION N/A 07/03/2017   Procedure: Venia Minks DILATION;  Surgeon: Daneil Dolin, MD;  Location: [facility];  Service: Endoscopy;  Laterality: N/A;    Current Outpatient Medications  Medication Sig Dispense Refill  . amLODipine (NORVASC) 5 MG tablet Take 1 tablet (5 mg total) by mouth daily.  90 tablet 1  . docusate sodium (COLACE) 100 MG capsule Take 100 mg by mouth daily.    . Fiber POWD Take by mouth daily. As needed    . hydrocortisone (ANUSOL-HC) 2.5 % rectal cream Place 1 application rectally 2 (two) times daily. (Patient taking differently: Place 1 application rectally 2 (two) times daily. As needed) 30 g 1  . pantoprazole (PROTONIX) 40 MG tablet TAKE 1 TABLET BY MOUTH EVERY DAY (Patient taking differently: as needed. ) 90 tablet 0  . rosuvastatin (CRESTOR) 5 MG tablet Take 1 tablet (5 mg total) by mouth daily. 90 tablet 1  . sertraline (ZOLOFT) 100 MG tablet Take 1 tablet (100 mg total) by mouth daily. 90 tablet 1  . valACYclovir (VALTREX) 500 MG tablet Take 1 tablet (500 mg total) by mouth daily. 90 tablet 1   No current facility-administered medications for this visit.    Allergies as of 06/24/2020  . (No Known Allergies)    Family History  Problem Relation Age of Onset  . Hypertension Mother   . Hypertension Father   . Hypertension Brother   . Colon cancer Neg Hx   . Colon polyps Neg Hx     Social History   Socioeconomic History  . Marital status: Married    Spouse name: Not on file  . Number of children: Not on file  . Years of education: Not on file  . Highest education level: Not on  file  Occupational History  . Not on file  Tobacco Use  . Smoking status: Never Smoker  . Smokeless tobacco: Never Used  Vaping Use  . Vaping Use: Never used  Substance and Sexual Activity  . Alcohol use: No  . Drug use: No  . Sexual activity: Not on file  Other Topics Concern  . Not on file  Social History Narrative  . Not on file   Social Determinants of Health   Financial Resource Strain:   . Difficulty of Paying Living Expenses: Not on file  Food Insecurity:   . Worried About Charity fundraiser in the Last Year: Not on file  . Ran Out of Food in the Last Year: Not on file  Transportation Needs:   . Lack of Transportation (Medical): Not on file  .  Lack of Transportation (Non-Medical): Not on file  Physical Activity:   . Days of Exercise per Week: Not on file  . Minutes of Exercise per Session: Not on file  Stress:   . Feeling of Stress : Not on file  Social Connections:   . Frequency of Communication with Friends and Family: Not on file  . Frequency of Social Gatherings with Friends and Family: Not on file  . Attends Religious Services: Not on file  . Active Member of Clubs or Organizations: Not on file  . Attends Archivist Meetings: Not on file  . Marital Status: Not on file    Review of Systems: Gen: Denies fever, chills, anorexia. Denies fatigue, weakness, weight loss.  CV: Denies chest pain, palpitations, syncope, peripheral edema, and claudication. Resp: Denies dyspnea at rest, cough, wheezing, coughing up blood, and pleurisy. GI: see HPI Derm: Denies rash, itching, dry skin Psych: Denies depression, anxiety, memory loss, confusion. No homicidal or suicidal ideation.  Heme: Denies bruising, bleeding, and enlarged lymph nodes.  Physical Exam: BP 129/86   Pulse 82   Temp (!) 97.2 F (36.2 C) (Oral)   Ht 5\' 8"  (1.727 m)   Wt 175 lb 12.8 oz (79.7 kg)   BMI 26.73 kg/m  General:   Alert and oriented. No distress noted. Pleasant and cooperative.  Head:  Normocephalic and atraumatic. Eyes:  Conjuctiva clear without scleral icterus. Mouth:  Mask in place Abdomen:  +BS, soft, non-tender and non-distended. No rebound or guarding. No HSM or masses noted. Rectal: without external hemorrhoids, no obvious fissure, DRE unremarkable Msk:  Symmetrical without gross deformities. Normal posture. Extremities:  Without edema. Neurologic:  Alert and  oriented x4 Psych:  Alert and cooperative. Normal mood and affect.  ASSESSMENT: Steven Gallagher is a 44 y.o. male presenting today with history of symptomatic hemorrhoids s/p banding X 3 in 2019 and then again neutrally in September 2020, now with persistent scant hematochezia  with wiping.  Highly suspect the low-volume blood he has noted (described as specks) is related to straining and constipation, as he notes this has been an issue. No outright symptoms that would point to need for banding.  Chronic gas/bloating, with history of SBBO. Responded well to Xifaxan about 5 years ago. Will complete another round.   I have asked that he increase stool softener to BID. With his increased gas, will hold off on fiber but may need to introduce this again if no significant improvement with bowel habits. Requesting ifbot. If positive, pursue colonoscopy. Last colonoscopy in 2018 normal other than internal hemorrhoids, and he has no family history of colorectal cancer or polyps, and he has  no personal history of polyps. However, with his persistent symptoms, would err on side of caution.    PLAN:  Ifobt requested  Round of Xifaxan X 14 days  Increase stool softener to BID  Further recommendations to follow  6 month follow-up regardless  [name], PhD, ANP-BC [facility]

## 2020-06-27 ENCOUNTER — Ambulatory Visit (INDEPENDENT_AMBULATORY_CARE_PROVIDER_SITE_OTHER): Payer: Self-pay | Admitting: Gastroenterology

## 2020-06-27 ENCOUNTER — Other Ambulatory Visit: Payer: Self-pay | Admitting: *Deleted

## 2020-06-27 DIAGNOSIS — R140 Abdominal distension (gaseous): Secondary | ICD-10-CM

## 2020-06-27 DIAGNOSIS — K641 Second degree hemorrhoids: Secondary | ICD-10-CM

## 2020-06-27 DIAGNOSIS — K921 Melena: Secondary | ICD-10-CM

## 2020-06-27 DIAGNOSIS — K5900 Constipation, unspecified: Secondary | ICD-10-CM

## 2020-06-27 DIAGNOSIS — K649 Unspecified hemorrhoids: Secondary | ICD-10-CM

## 2020-06-27 LAB — IFOBT (OCCULT BLOOD): IFOBT: NEGATIVE

## 2020-06-27 NOTE — Addendum Note (Signed)
Addended by: Metro Kung on: 06/27/2020 03:54 PM   Modules accepted: Orders, Level of Service

## 2020-07-27 DIAGNOSIS — E7849 Other hyperlipidemia: Secondary | ICD-10-CM | POA: Diagnosis not present

## 2020-07-27 DIAGNOSIS — R7303 Prediabetes: Secondary | ICD-10-CM | POA: Diagnosis not present

## 2020-07-27 DIAGNOSIS — I10 Essential (primary) hypertension: Secondary | ICD-10-CM | POA: Diagnosis not present

## 2020-07-28 LAB — HEPATIC FUNCTION PANEL
ALT: 18 IU/L (ref 0–44)
AST: 20 IU/L (ref 0–40)
Albumin: 4.7 g/dL (ref 4.0–5.0)
Alkaline Phosphatase: 87 IU/L (ref 44–121)
Bilirubin Total: 0.5 mg/dL (ref 0.0–1.2)
Bilirubin, Direct: 0.12 mg/dL (ref 0.00–0.40)
Total Protein: 7.2 g/dL (ref 6.0–8.5)

## 2020-07-28 LAB — LIPID PANEL
Chol/HDL Ratio: 3.5 ratio (ref 0.0–5.0)
Cholesterol, Total: 184 mg/dL (ref 100–199)
HDL: 52 mg/dL (ref >39)
LDL Chol Calc (NIH): 118 mg/dL — ABNORMAL HIGH (ref 0–99)
Triglycerides: 78 mg/dL (ref 0–149)
VLDL Cholesterol Cal: 14 mg/dL (ref 5–40)

## 2020-07-29 ENCOUNTER — Encounter: Payer: Self-pay | Admitting: Family Medicine

## 2020-08-17 ENCOUNTER — Ambulatory Visit: Payer: BC Managed Care – PPO | Admitting: Family Medicine

## 2020-08-18 ENCOUNTER — Encounter: Payer: Self-pay | Admitting: Nurse Practitioner

## 2020-08-19 ENCOUNTER — Ambulatory Visit (INDEPENDENT_AMBULATORY_CARE_PROVIDER_SITE_OTHER): Payer: BC Managed Care – PPO | Admitting: Family Medicine

## 2020-08-19 ENCOUNTER — Encounter: Payer: Self-pay | Admitting: Family Medicine

## 2020-08-19 ENCOUNTER — Other Ambulatory Visit: Payer: Self-pay

## 2020-08-19 VITALS — BP 140/90 | HR 100 | Temp 97.8°F | Ht 68.0 in | Wt 173.6 lb

## 2020-08-19 DIAGNOSIS — J019 Acute sinusitis, unspecified: Secondary | ICD-10-CM

## 2020-08-19 DIAGNOSIS — F411 Generalized anxiety disorder: Secondary | ICD-10-CM

## 2020-08-19 MED ORDER — AMOXICILLIN-POT CLAVULANATE 875-125 MG PO TABS: 1.0000 | ORAL_TABLET | Freq: Two times a day (BID) | ORAL | 0 refills | Status: DC

## 2020-08-19 NOTE — Progress Notes (Signed)
Pt has been having headaches off and on. Feeling nauseated. Headaches in frontal area and sometimes in back of head. Pt has history of HTN. Does check BP at home and has been normal.     Patient ID: Steven Gallagher, male    DOB: August 23, 1976, 44 y.o.   MRN: [id number]   Chief Complaint  Patient presents with  . Headache   Subjective:  CC: headaches, possible blood pressure  This is a new problem. Presents with c/o of headaches in back of head and sometimes frontal. Denies blurred vision. Tried Tylenol and Aleve and headache goes away. Worse pain 5/10, current 2/10 today. Reports he has not taken his blood pressure medicine today before this visit. Reports gets sinus infection occasionally. Reports under a lot of stress lately.  Denies fever, chest pain, shortness of breath, congestion, cough, sore throat, ear pain, abdominal pain.  Does report felt chills last night and has pressure in the eye area.  He is in no apparent distress today.  Headache, has not awakened him at night.    Medical History Steven Gallagher has a past medical history of Dizziness, Elevated blood pressure, GERD (gastroesophageal reflux disease), Hyperglycemia, Hyperlipidemia, Hyperlipidemia (12/17/2019), and Hypertension.   Outpatient Encounter Medications as of 08/19/2020  Medication Sig  . amLODipine (NORVASC) 5 MG tablet Take 1 tablet (5 mg total) by mouth daily.  Marland Kitchen docusate sodium (COLACE) 100 MG capsule Take 100 mg by mouth daily.  . Fiber POWD Take by mouth daily. As needed  . hydrocortisone (ANUSOL-HC) 2.5 % rectal cream Place 1 application rectally 2 (two) times daily. (Patient taking differently: Place 1 application rectally 2 (two) times daily. As needed)  . pantoprazole (PROTONIX) 40 MG tablet TAKE 1 TABLET BY MOUTH EVERY DAY (Patient taking differently: as needed. )  . rosuvastatin (CRESTOR) 5 MG tablet Take 1 tablet (5 mg total) by mouth daily.  . sertraline (ZOLOFT) 100 MG tablet Take 1 tablet (100 mg total) by mouth  daily.  . valACYclovir (VALTREX) 500 MG tablet Take 1 tablet (500 mg total) by mouth daily.  Marland Kitchen amoxicillin-clavulanate (AUGMENTIN) 875-125 MG tablet Take 1 tablet by mouth 2 (two) times daily.   No facility-administered encounter medications on file as of 08/19/2020.     Review of Systems  Constitutional: Negative for chills and fever.  HENT: Positive for sinus pressure. Negative for congestion.   Respiratory: Negative for shortness of breath.   Cardiovascular: Negative for chest pain.  Gastrointestinal: Negative for abdominal pain.  Neurological: Positive for headaches.       Denies neurological symptoms. Denies being the worse headache of his life.      Vitals BP 140/90   Pulse 100   Temp 97.8 F (36.6 C)   Ht 5\' 8"  (1.727 m)   Wt 173 lb 9.6 oz (78.7 kg)   SpO2 99%   BMI 26.40 kg/m   Objective:   Physical Exam Vitals and nursing note reviewed.  Constitutional:      Appearance: He is well-developed.  Cardiovascular:     Rate and Rhythm: Normal rate and regular rhythm.     Heart sounds: Normal heart sounds.  Pulmonary:     Effort: Pulmonary effort is normal.     Breath sounds: Normal breath sounds.  Skin:    General: Skin is warm and dry.  Neurological:     General: No focal deficit present.     Mental Status: He is alert and oriented to person, place, and time. Mental status is  at baseline.     Motor: No weakness.     Gait: Gait normal.  Psychiatric:        Mood and Affect: Mood normal.        Behavior: Behavior normal.        Thought Content: Thought content normal.        Judgment: Judgment normal.      Assessment and Plan   1. Acute rhinosinusitis - amoxicillin-clavulanate (AUGMENTIN) 875-125 MG tablet; Take 1 tablet by mouth 2 (two) times daily.  Dispense: 20 tablet; Refill: 0  2. Generalized anxiety disorder   Will take his blood pressure medicine as soon as he gets home and will monitor blood pressure in a few hours after taking. He will notify  if pressure does not return to closer to 130/80. Pain currently 2/10. Describes pain as pressure at eyes. No photosensitivity.     Voiced concerns that his anxiety is not being managed in social situations. Wishes to increase Sertraline. He has 50 mg tablets from his last script and will increase from 100 mg to 150 mg to see if this is effective.  Take 100 mg tab and 50 mg tab of sertraline to equal 150 mg to see if symptoms improve. Will change prescription once determine this dose is effective.   Will treat for sinus infection with antibiotic for 10 days.   Agrees with plan of care discussed today. Understands warning signs to seek further care: chest pain, shortness of breath, blood pressure not returning to normal after taking medication.  Understands to follow-up if headache does not resolve after taking blood pressure medication. He will return in 6 weeks to see if increasing sertraline dose is effective.   Chalmers Guest, NP 08/19/2020

## 2020-08-19 NOTE — Patient Instructions (Addendum)
Take blood pressure pill immediately and in a few hours recheck your blood pressure  Check blood pressure daily for a few days to  Make sure it stays < 140/90. Goal is around 130/80.  Try to take blood pressure pill consistently within one to two hours window. Will treat for sinus infection today. Let us know if this does not help.   Take Sertraline 100 mg and 50 mg tablets to equal 150 mg dose. See if this improve to improve symptoms.  Follow-up in 6 weeks.     Sinus Headache  A sinus headache occurs when your sinuses become clogged or swollen. Sinuses are air-filled spaces in your skull that are behind the bones of your face and forehead. Sinus headaches can range from mild to severe. What are the causes? A sinus headache can result from various conditions that affect the sinuses. Common causes include:  Colds.  Sinus infections.  Allergies. Many people confuse sinus headaches with migraines or tension headaches because those headaches can also cause facial pain and nasal symptoms. What are the signs or symptoms? The main symptom of this condition is a headache that may feel like pain or pressure in your face, forehead, ears, or upper teeth. People who have a sinus headache often have other symptoms, such as:  Congested or runny nose.  Fever.  Inability to smell. Weather changes can make symptoms worse. How is this diagnosed? This condition may be diagnosed based on:  A physical exam and medical history.  Imaging tests, such as a CT scan or MRI, to check for problems with the sinuses.  Examination of the sinuses using a thin tool with a camera that is inserted through your nose (endoscopy). How is this treated? Treatment for this condition depends on the cause.  Sinus pain that is caused by a sinus infection may be treated with antibiotic medicine.  Sinus pain that is caused by allergies may be helped by allergy medicines (antihistamines) and medicated nasal  sprays.  Sinus pain that is caused by congestion may be helped by rinsing out (flushing) the nose and sinuses with saline solution.  Sinus surgery may be needed in some cases if other treatments do not help. Follow these instructions at home: General instructions  If directed: ? Apply a warm, moist washcloth to your face to help relieve pain. ? Use a nasal saline wash. Medicines   Take over-the-counter and prescription medicines only as told by your health care provider.  If you were prescribed an antibiotic medicine, take it as told by your health care provider. Do not stop taking the antibiotic even if you start to feel better.  If you have congestion, use a nasal spray to help lessen pressure. Hydrate and humidify  Drink enough water to keep your urine clear or pale yellow. Staying hydrated will help to thin your mucus.  Use a cool mist humidifier to keep the humidity level in your home above 50%.  Inhale steam for 10-15 minutes, 3-4 times a day or as told by your health care provider. You can do this in the bathroom while a hot shower is running.  Limit your exposure to cool or dry air. Contact a health care provider if:  You have a headache more than one time a week.  You have sensitivity to light or sound.  You develop a fever.  You feel nauseous or you vomit.  Your headaches do not get better with treatment. Many people think that they have a sinus headache  when they actually have a migraine or a tension headache. Get help right away if:  You have vision problems.  You have sudden, severe pain in your face or head.  You have a seizure.  You are confused.  You have a stiff neck. Summary  A sinus headache occurs when your sinuses become clogged or swollen.  A sinus headache can result from various conditions that affect the sinuses, such as a cold, a sinus infection, or an allergy.  Treatment for this condition depends on the cause. It may include  medicine, such as antibiotics or antihistamines. This information is not intended to replace advice given to you by your health care provider. Make sure you discuss any questions you have with your health care provider. Document Revised: 08/16/2017 Document Reviewed: 06/14/2017 Elsevier Patient Education  2020 [employer].

## 2020-08-21 ENCOUNTER — Encounter: Payer: Self-pay | Admitting: Family Medicine

## 2020-09-06 ENCOUNTER — Other Ambulatory Visit: Payer: Self-pay

## 2020-09-06 ENCOUNTER — Telehealth (INDEPENDENT_AMBULATORY_CARE_PROVIDER_SITE_OTHER): Payer: BC Managed Care – PPO | Admitting: Family Medicine

## 2020-09-06 ENCOUNTER — Telehealth: Payer: Self-pay | Admitting: Family Medicine

## 2020-09-06 ENCOUNTER — Encounter: Payer: Self-pay | Admitting: Family Medicine

## 2020-09-06 DIAGNOSIS — R110 Nausea: Secondary | ICD-10-CM | POA: Diagnosis not present

## 2020-09-06 DIAGNOSIS — R519 Headache, unspecified: Secondary | ICD-10-CM

## 2020-09-06 DIAGNOSIS — I10 Essential (primary) hypertension: Secondary | ICD-10-CM

## 2020-09-06 MED ORDER — ONDANSETRON 8 MG PO TBDP: 8.0000 mg | ORAL_TABLET | Freq: Three times a day (TID) | ORAL | 0 refills | Status: DC | PRN

## 2020-09-06 NOTE — Telephone Encounter (Signed)
[name], corter are scheduled for a virtual visit with your provider today.    Just as we do with appointments in the office, we must obtain your consent to participate.  Your consent will be active for this visit and any virtual visit you may have with one of our providers in the next 365 days.    If you have a MyChart account, I can also send a copy of this consent to you electronically.  All virtual visits are billed to your insurance company just like a traditional visit in the office.  As this is a virtual visit, video technology does not allow for your provider to perform a traditional examination.  This may limit your provider's ability to fully assess your condition.  If your provider identifies any concerns that need to be evaluated in person or the need to arrange testing such as labs, EKG, etc, we will make arrangements to do so.    Although advances in technology are sophisticated, we cannot ensure that it will always work on either your end or our end.  If the connection with a video visit is poor, we may have to switch to a telephone visit.  With either a video or telephone visit, we are not always able to ensure that we have a secure connection.   I need to obtain your verbal consent now.   Are you willing to proceed with your visit today?   Steven Gallagher has provided verbal consent on 09/06/2020 for a virtual visit (video or telephone).   Vicente Males, LPN [id number]  38:93 AM

## 2020-09-06 NOTE — Progress Notes (Signed)
Pt having headache, congestion, nausea and pressure around eyes. Began a few weeks ago. Has not been around any sick contacts and no COVID test recently.   Virtual Visit via Telephone Note  I connected with Steven Gallagher on 09/06/20 at 11:20 AM EST by telephone and verified that I am speaking with the correct person using two identifiers.  Location: Patient: home Provider: office   I discussed the limitations, risks, security and privacy concerns of performing an evaluation and management service by telephone and the availability of in person appointments. I also discussed with the patient that there may be a patient responsible charge related to this service. The patient expressed understanding and agreed to proceed.   History of Present Illness:    Observations/Objective:   Assessment and Plan:   Follow Up Instructions:    I discussed the assessment and treatment plan with the patient. The patient was provided an opportunity to ask questions and all were answered. The patient agreed with the plan and demonstrated an understanding of the instructions.   The patient was advised to call back or seek an in-person evaluation if the symptoms worsen or if the condition fails to improve as anticipated.  I provided 19 minutes of non-face-to-face time during this encounter.     Patient ID: Steven Gallagher, male    DOB: Feb 03, 1976, 44 y.o.   MRN: [id number]   Chief Complaint  Patient presents with  . Headache   Subjective:  CC: headache, congestion, nausea, pressure around eyes  This is not a new problem. Was originally seen on December 3 diagnosed with sinus infection, prescribed Augmentin has completed this course. Still reports that he is having headache, congestion, some nausea and pressure around his eyes. Symptoms have been present for a few weeks. Headache today is 12/3, blood pressure on December 3 was 140/90. He reports that he has taken his blood pressure since then and it  has been "good ".    Medical History [name] has a past medical history of Dizziness, Elevated blood pressure, GERD (gastroesophageal reflux disease), Hyperglycemia, Hyperlipidemia, Hyperlipidemia (12/17/2019), and Hypertension.   Outpatient Encounter Medications as of 09/06/2020  Medication Sig  . amLODipine (NORVASC) 5 MG tablet Take 1 tablet (5 mg total) by mouth daily.  Marland Kitchen docusate sodium (COLACE) 100 MG capsule Take 100 mg by mouth daily.  . Fiber POWD Take by mouth daily. As needed  . hydrocortisone (ANUSOL-HC) 2.5 % rectal cream Place 1 application rectally 2 (two) times daily. (Patient taking differently: Place 1 application rectally 2 (two) times daily. As needed)  . rosuvastatin (CRESTOR) 5 MG tablet Take 1 tablet (5 mg total) by mouth daily.  . sertraline (ZOLOFT) 100 MG tablet Take 1 tablet (100 mg total) by mouth daily.  . valACYclovir (VALTREX) 500 MG tablet Take 1 tablet (500 mg total) by mouth daily.  . ondansetron (ZOFRAN ODT) 8 MG disintegrating tablet Take 1 tablet (8 mg total) by mouth every 8 (eight) hours as needed for nausea or vomiting.  . pantoprazole (PROTONIX) 40 MG tablet TAKE 1 TABLET BY MOUTH EVERY DAY (Patient not taking: Reported on 09/06/2020)  . [DISCONTINUED] amoxicillin-clavulanate (AUGMENTIN) 875-125 MG tablet Take 1 tablet by mouth 2 (two) times daily.   No facility-administered encounter medications on file as of 09/06/2020.     Review of Systems  Constitutional: Negative for chills and fever.  HENT: Positive for congestion and sinus pressure. Negative for ear pain.   Respiratory: Negative for shortness of breath.  Cardiovascular: Negative for chest pain.  Gastrointestinal: Positive for nausea. Negative for abdominal pain and vomiting.  Neurological: Positive for headaches.     Vitals There were no vitals taken for this visit. 132/87 (per patient BP cuff during our call) Objective:   Physical Exam unable  Assessment and Plan   1.  Nausea - ondansetron (ZOFRAN ODT) 8 MG disintegrating tablet; Take 1 tablet (8 mg total) by mouth every 8 (eight) hours as needed for nausea or vomiting.  Dispense: 20 tablet; Refill: 0  2. Elevated blood pressure reading in office with diagnosis of hypertension  3. Nonintractable headache, unspecified chronicity pattern, unspecified headache type   Headaches:  1. His blood pressure being managed appropriately, last in office pressure 140/90, today he took his blood pressure while on the phone it was 132/87 which is still a little elevated, is this causing his headaches? He will monitor his blood pressures a few times per week in the next few weeks, keep a log of this and follow-up with this information in 3 weeks.  2. This is headaches related to allergy symptoms? He is instructed to buy over-the-counter Zyrtec take it as instructed on the bottle, to see if he has any relief in his symptoms. He will continue to monitor his blood pressure with the addition of this medication.  Nausea: 3. He wonders if he is experiencing acid reflux, which is causing the nausea. He recently had to change from Protonix to famotidine due to insurance purposes, wonders if it is working appropriately. He will increase the famotidine to twice a day, to resolve his symptoms, and then attempt to wean back down to once per day, will see if symptoms come back.   Agrees with plan of care discussed today. Understands warning signs to seek further care: Chest pain, shortness of breath, any significant change in health, uncontrolled blood pressures. Understands to follow-up in 3 weeks, with blood pressure log, to assess effectiveness of blood pressure medication. He will let us know if his symptoms do not resolve, or if his blood pressures are not consistently below 130/80.    Steven Guest, NP 09/06/2020

## 2020-09-26 ENCOUNTER — Encounter: Payer: Self-pay | Admitting: Family Medicine

## 2020-09-26 ENCOUNTER — Ambulatory Visit (INDEPENDENT_AMBULATORY_CARE_PROVIDER_SITE_OTHER): Payer: BC Managed Care – PPO | Admitting: Family Medicine

## 2020-09-26 ENCOUNTER — Other Ambulatory Visit: Payer: Self-pay

## 2020-09-26 VITALS — BP 130/72 | HR 92 | Temp 98.4°F | Ht 68.0 in | Wt 181.0 lb

## 2020-09-26 DIAGNOSIS — R7303 Prediabetes: Secondary | ICD-10-CM

## 2020-09-26 DIAGNOSIS — F439 Reaction to severe stress, unspecified: Secondary | ICD-10-CM

## 2020-09-26 DIAGNOSIS — K219 Gastro-esophageal reflux disease without esophagitis: Secondary | ICD-10-CM

## 2020-09-26 DIAGNOSIS — E7849 Other hyperlipidemia: Secondary | ICD-10-CM | POA: Diagnosis not present

## 2020-09-26 DIAGNOSIS — Z114 Encounter for screening for human immunodeficiency virus [HIV]: Secondary | ICD-10-CM | POA: Diagnosis not present

## 2020-09-26 DIAGNOSIS — I10 Essential (primary) hypertension: Secondary | ICD-10-CM | POA: Diagnosis not present

## 2020-09-26 MED ORDER — AMLODIPINE BESYLATE 5 MG PO TABS
5.0000 mg | ORAL_TABLET | Freq: Every day | ORAL | 1 refills | Status: DC
Start: 2020-09-26 — End: 2021-03-06

## 2020-09-26 MED ORDER — PANTOPRAZOLE SODIUM 40 MG PO TBEC
40.0000 mg | DELAYED_RELEASE_TABLET | Freq: Every day | ORAL | 1 refills | Status: DC
Start: 2020-09-26 — End: 2021-03-06

## 2020-09-26 MED ORDER — ROSUVASTATIN CALCIUM 5 MG PO TABS
5.0000 mg | ORAL_TABLET | Freq: Every day | ORAL | 1 refills | Status: DC
Start: 2020-09-26 — End: 2021-03-06

## 2020-09-26 MED ORDER — AZELASTINE HCL 0.1 % NA SOLN: 2.0000 | Freq: Two times a day (BID) | NASAL | 12 refills | Status: DC

## 2020-09-26 MED ORDER — SERTRALINE HCL 100 MG PO TABS
100.0000 mg | ORAL_TABLET | Freq: Every day | ORAL | 1 refills | Status: DC
Start: 2020-09-26 — End: 2021-03-06

## 2020-09-26 MED ORDER — VALACYCLOVIR HCL 500 MG PO TABS
500.0000 mg | ORAL_TABLET | Freq: Every day | ORAL | 1 refills | Status: DC
Start: 2020-09-26 — End: 2021-03-06

## 2020-09-26 NOTE — Progress Notes (Signed)
Subjective:    Patient ID: Steven Gallagher, male    DOB: [birth year], 45 y.o.   MRN: [id number]  HPIrecheck bp.   Nausea for a couple of weeks. Pt states pharm never filled zofran that karen prescribed on 12/21. Pt thinks nausea is coming from reflux or sinus drainage. He is taking famotidine for reflux and asked if there was a stronger med. Insurance would not cover protonix.   Sinus symptoms. Headache and congestion for a couple of weeks. Finished augmentin.  He relates mainly postnasal drip  Essential hypertension - Plan: Lipid panel, Hepatic function panel, Basic metabolic panel, HIV Antibody (routine testing w rflx), Hemoglobin A1c  Other hyperlipidemia - Plan: Lipid panel, Hepatic function panel, Basic metabolic panel, HIV Antibody (routine testing w rflx), Hemoglobin A1c  Prediabetes - Plan: Lipid panel, Hepatic function panel, Basic metabolic panel, HIV Antibody (routine testing w rflx), Hemoglobin A1c  Screening for HIV (human immunodeficiency virus) - Plan: Lipid panel, Hepatic function panel, Basic metabolic panel, HIV Antibody (routine testing w rflx), Hemoglobin A1c  Gastroesophageal reflux disease without esophagitis - Plan: Lipid panel, Hepatic function panel, Basic metabolic panel, HIV Antibody (routine testing w rflx), Hemoglobin A1c  Stress - Plan: Lipid panel, Hepatic function panel, Basic metabolic panel, HIV Antibody (routine testing w rflx), Hemoglobin A1c  Blood pressure he takes his medication on a regular basis.  He tries to minimize salt in his diet.  Denies any major troubles  Takes his cholesterol medicine on a regular basis.  Denies any major issues with it.  States tolerating it well  Stress levels somewhat high at times but he tries to deal with things as best as he can denies being depressed but does relate at times being anxious  History prediabetes minimizes starches in the diet   Review of Systems  Constitutional: Negative for diaphoresis and  fatigue.  HENT: Negative for congestion and rhinorrhea.   Respiratory: Negative for cough and shortness of breath.   Cardiovascular: Negative for chest pain and leg swelling.  Gastrointestinal: Negative for abdominal pain and diarrhea.  Skin: Negative for color change and rash.  Neurological: Negative for dizziness and headaches.  Psychiatric/Behavioral: Negative for behavioral problems and confusion.       Objective:   Physical Exam Vitals reviewed.  Constitutional:      General: He is not in acute distress.    Appearance: He is well-nourished.  HENT:     Head: Normocephalic and atraumatic.  Eyes:     General:        Right eye: No discharge.        Left eye: No discharge.  Neck:     Trachea: No tracheal deviation.  Cardiovascular:     Rate and Rhythm: Normal rate and regular rhythm.     Heart sounds: Normal heart sounds. No murmur heard.   Pulmonary:     Effort: Pulmonary effort is normal. No respiratory distress.     Breath sounds: Normal breath sounds.  Musculoskeletal:        General: No edema.  Lymphadenopathy:     Cervical: No cervical adenopathy.  Skin:    General: Skin is warm and dry.  Neurological:     Mental Status: He is alert.     Coordination: Coordination normal.  Psychiatric:        Mood and Affect: Mood and affect normal.        Behavior: Behavior normal.           Assessment &  Plan:  1. Essential hypertension HTN- patient seen for follow-up regarding HTN.  Diet, medication compliance, appropriate labs and refills were completed.  Importance of keeping blood pressure under good control to lessen the risk of complications discussed BP was taken on recheck and overall was good - Lipid panel - Hepatic function panel - Basic metabolic panel - HIV Antibody (routine testing w rflx) - Hemoglobin A1c  2. Other hyperlipidemia Hyperlipidemia-importance of diet, weight control, activity, compliance with medications discussed.  Recent labs reviewed.   Any additional labs or refills ordered.  Importance of keeping under good control discussed. Very important for the patient to take cholesterol medicine regular basis - Lipid panel - Hepatic function panel - Basic metabolic panel - HIV Antibody (routine testing w rflx) - Hemoglobin A1c  3. Prediabetes Minimize starches in the diet stay physically active keep weight and check - Lipid panel - Hepatic function panel - Basic metabolic panel - HIV Antibody (routine testing w rflx) - Hemoglobin A1c  4. Screening for HIV (human immunodeficiency virus) HIV screening per patient's request - Lipid panel - Hepatic function panel - Basic metabolic panel - HIV Antibody (routine testing w rflx) - Hemoglobin A1c  5. Gastroesophageal reflux disease without esophagitis Reflux related symptoms in the back of his throat not severe try Protonix if this is not covered his insurance company should let us know - Lipid panel - Hepatic function panel - Basic metabolic panel - HIV Antibody (routine testing w rflx) - Hemoglobin A1c  6. Stress Take Zoloft on a regular basis to help with stress related issues denies being depressed currently - Lipid panel - Hepatic function panel - Basic metabolic panel - HIV Antibody (routine testing w rflx) - Hemoglobin A1c  Renewal of medications patient to do lab work before follow-up visit in 5 to 6 months

## 2020-09-27 ENCOUNTER — Ambulatory Visit: Payer: BC Managed Care – PPO | Admitting: Family Medicine

## 2020-09-27 ENCOUNTER — Ambulatory Visit: Payer: BC Managed Care – PPO | Admitting: Nurse Practitioner

## 2020-09-29 ENCOUNTER — Ambulatory Visit: Payer: BC Managed Care – PPO | Admitting: Nurse Practitioner

## 2020-10-24 ENCOUNTER — Encounter: Payer: Self-pay | Admitting: Family Medicine

## 2020-10-24 ENCOUNTER — Other Ambulatory Visit: Payer: Self-pay

## 2020-10-24 ENCOUNTER — Ambulatory Visit (INDEPENDENT_AMBULATORY_CARE_PROVIDER_SITE_OTHER): Payer: BC Managed Care – PPO | Admitting: Family Medicine

## 2020-10-24 VITALS — BP 122/82 | HR 88 | Temp 98.5°F | Ht 68.0 in | Wt 182.0 lb

## 2020-10-24 DIAGNOSIS — R202 Paresthesia of skin: Secondary | ICD-10-CM | POA: Insufficient documentation

## 2020-10-24 DIAGNOSIS — K219 Gastro-esophageal reflux disease without esophagitis: Secondary | ICD-10-CM

## 2020-10-24 NOTE — Progress Notes (Signed)
Patient ID: Steven Gallagher, male    DOB: 05/27/1976, 45 y.o.   MRN: [id number]   Chief Complaint  Patient presents with  . Flare of Reflux    Patient has history of reflux- stopped protonix for a bit but had to restart it   . tingling in both arms and legs    For several days   Subjective:  CC: acid reflux and arm and leg tingle   This is a new problem.  Presents today with a complaint of acid reflux and tingling in his arms and legs.  Reports that he has been taking Protonix for a few weeks now still feeling that feeling in his throat.  They tingle in his arms and legs have been present for about a week, after several questions, it is possible this is related to a new job that he was doing which required heavy lifting and strenuous activity for 5 hours in a row last week.  He denies fever and fatigue, wonders if he experiences chills occasionally.  Denies chest pain, shortness of breath, abdominal pain, nausea vomiting or diarrhea.    Medical History Steven Gallagher has a past medical history of Dizziness, Elevated blood pressure, GERD (gastroesophageal reflux disease), Hyperglycemia, Hyperlipidemia, Hyperlipidemia (12/17/2019), and Hypertension.   Outpatient Encounter Medications as of 10/24/2020  Medication Sig  . amLODipine (NORVASC) 5 MG tablet Take 1 tablet (5 mg total) by mouth daily.  Marland Kitchen azelastine (ASTELIN) 0.1 % nasal spray Place 2 sprays into both nostrils 2 (two) times daily.  Marland Kitchen docusate sodium (COLACE) 100 MG capsule Take 100 mg by mouth daily.  . Fiber POWD Take by mouth daily. As needed  . hydrocortisone (ANUSOL-HC) 2.5 % rectal cream Place 1 application rectally 2 (two) times daily. (Patient taking differently: Place 1 application rectally 2 (two) times daily. As needed)  . ondansetron (ZOFRAN ODT) 8 MG disintegrating tablet Take 1 tablet (8 mg total) by mouth every 8 (eight) hours as needed for nausea or vomiting. (Patient not taking: Reported on 09/26/2020)  . pantoprazole  (PROTONIX) 40 MG tablet Take 1 tablet (40 mg total) by mouth daily.  . rosuvastatin (CRESTOR) 5 MG tablet Take 1 tablet (5 mg total) by mouth daily.  . sertraline (ZOLOFT) 100 MG tablet Take 1 tablet (100 mg total) by mouth daily.  . valACYclovir (VALTREX) 500 MG tablet Take 1 tablet (500 mg total) by mouth daily.   No facility-administered encounter medications on file as of 10/24/2020.     Review of Systems  Constitutional: Positive for chills. Negative for fatigue and fever.  HENT: Positive for congestion and postnasal drip. Negative for sinus pressure, sinus pain, sneezing and sore throat.   Respiratory: Negative for cough, chest tightness and shortness of breath.   Cardiovascular: Negative for chest pain, palpitations and leg swelling.  Gastrointestinal: Negative for abdominal pain, constipation, diarrhea, nausea and vomiting.  Genitourinary: Negative for dysuria.  Musculoskeletal: Negative for back pain.  Skin: Positive for rash.       Right upper arm with erythematous papules. Itches. Noticed today.   Neurological: Negative for dizziness, light-headedness and headaches.     Vitals BP 122/82   Pulse 88   Temp 98.5 F (36.9 C) (Oral)   Ht 5\' 8"  (1.727 m)   Wt 182 lb (82.6 kg)   SpO2 99%   BMI 27.67 kg/m   Objective:   Physical Exam Vitals reviewed.  Cardiovascular:     Rate and Rhythm: Normal rate and regular rhythm.  Heart sounds: Normal heart sounds.  Pulmonary:     Effort: Pulmonary effort is normal.     Breath sounds: Normal breath sounds.  Skin:    General: Skin is warm and dry.  Neurological:     General: No focal deficit present.     Mental Status: He is alert.     Cranial Nerves: Cranial nerves are intact.     Motor: No weakness.     Coordination: Finger-Nose-Finger Test normal.     Gait: Gait is intact.     Comments: 5/5 upper and lower body strength  Psychiatric:        Behavior: Behavior normal.      Assessment and Plan   1.  Gastroesophageal reflux disease without esophagitis  2. Tingling in extremities   Will continue to use Protonix daily as prescribed, and he will start to pay attention to his symptoms associated with the foods that he is eating.  Information given today on foods to eat and avoid with GERD.  He reports that he does eat pizza somewhat regularly with tomato sauce, he does drink 1 cup of coffee a day and some carbonated beverages.  Also reports that he has peppermint candies frequently throughout the day at work.  He will start to keep a food diary, monitor the symptoms as associated with the types of food he is eating, and try to eliminate foods that are known triggers for GERD.  As for the arm and leg tingling, it is possible this is associated with that job that he was doing last week which required heavy lifting for greater than 5 hours.  He has no weakness, there is no cranial nerve deficit noted today.  He will pay attention to these symptoms and he will let me know if anything changes, or new symptoms develop.  No red flags noted today.  He also wondered whether this feeling that he has had with his arm and leg tingling could be associated with his anxiety.  I requested that he pay attention to what he is doing and when these feelings occur, and start to associate the cause if possible.  Agrees with plan of care discussed today. Understands warning signs to seek further care: chest pain, shortness of breath, any significant change in health.  Understands to follow-up if symptoms do not improve, or worsen.  He is encouraged to pay close attention to the foods that he is eating as they are associated with his symptoms of acid reflux.  I explained that it is not ideal to be on PPI long-term, as this can affect our bone health and vitamin absorption.  Pecolia Ades, NP 10/24/20

## 2020-10-24 NOTE — Patient Instructions (Signed)
Food Choices for Gastroesophageal Reflux Disease, Adult When you have gastroesophageal reflux disease (GERD), the foods you eat and your eating habits are very important. Choosing the right foods can help ease your discomfort. Think about working with a food expert (dietitian) to help you make good choices. What are tips for following this plan? Reading food labels  Look for foods that are low in saturated fat. Foods that may help with your symptoms include: ? Foods that have less than 5% of daily value (DV) of fat. ? Foods that have 0 grams of trans fat. Cooking  Do not fry your food.  Cook your food by baking, steaming, grilling, or broiling. These are all methods that do not need a lot of fat for cooking.  To add flavor, try to use herbs that are low in spice and acidity. Meal planning  Choose healthy foods that are low in fat, such as: ? Fruits and vegetables. ? Whole grains. ? Low-fat dairy products. ? Lean meats, fish, and poultry.  Eat small meals often instead of eating 3 large meals each day. Eat your meals slowly in a place where you are relaxed. Avoid bending over or lying down until 2-3 hours after eating.  Limit high-fat foods such as fatty meats or fried foods.  Limit your intake of fatty foods, such as oils, butter, and shortening.  Avoid the following as told by your doctor: ? Foods that cause symptoms. These may be different for different people. Keep a food diary to keep track of foods that cause symptoms. ? Alcohol. ? Drinking a lot of liquid with meals. ? Eating meals during the 2-3 hours before bed.   Lifestyle  Stay at a healthy weight. Ask your doctor what weight is healthy for you. If you need to lose weight, work with your doctor to do so safely.  Exercise for at least 30 minutes on 5 or more days each week, or as told by your doctor.  Wear loose-fitting clothes.  Do not smoke or use any products that contain nicotine or tobacco. If you need help  quitting, ask your doctor.  Sleep with the head of your bed higher than your feet. Use a wedge under the mattress or blocks under the bed frame to raise the head of the bed.  Chew sugar-free gum after meals. What foods should eat? Eat a healthy, well-balanced diet of fruits, vegetables, whole grains, low-fat dairy products, lean meats, fish, and poultry. Each person is different. Foods that may cause symptoms in one person may not cause any symptoms in another person. Work with your doctor to find foods that are safe for you. The items listed above may not be a complete list of what you can eat and drink. Contact a food expert for more options.   What foods should I avoid? Limiting some of these foods may help in managing the symptoms of GERD. Everyone is different. Talk with a food expert or your doctor to help you find the exact foods to avoid, if any. Fruits Any fruits prepared with added fat. Any fruits that cause symptoms. For some people, this may include citrus fruits, such as oranges, grapefruit, pineapple, and lemons. Vegetables Deep-fried vegetables. French fries. Any vegetables prepared with added fat. Any vegetables that cause symptoms. For some people, this may include tomatoes and tomato products, chili peppers, onions and garlic, and horseradish. Grains Pastries or quick breads with added fat. Meats and other proteins High-fat meats, such as fatty beef or pork,   hot dogs, ribs, ham, sausage, salami, and bacon. Fried meat or protein, including fried fish and fried chicken. Nuts and nut butters, in large amounts. Dairy Whole milk and chocolate milk. Sour cream. Cream. Ice cream. Cream cheese. Milkshakes. Fats and oils Butter. Margarine. Shortening. Ghee. Beverages Coffee and tea, with or without caffeine. Carbonated beverages. Sodas. Energy drinks. Fruit juice made with acidic fruits, such as orange or grapefruit. Tomato juice. Alcoholic drinks. Sweets and desserts Chocolate and  cocoa. Donuts. Seasonings and condiments Pepper. Peppermint and spearmint. Added salt. Any condiments, herbs, or seasonings that cause symptoms. For some people, this may include curry, hot sauce, or vinegar-based salad dressings. The items listed above may not be a complete list of what you should not eat and drink. Contact a food expert for more options. Questions to ask your doctor Diet and lifestyle changes are often the first steps that are taken to manage symptoms of GERD. If diet and lifestyle changes do not help, talk with your doctor about taking medicines. Where to find more information  International Foundation for Gastrointestinal Disorders: aboutgerd.org Summary  When you have GERD, food and lifestyle choices are very important in easing your symptoms.  Eat small meals often instead of 3 large meals a day. Eat your meals slowly and in a place where you are relaxed.  Avoid bending over or lying down until 2-3 hours after eating.  Limit high-fat foods such as fatty meats or fried foods. This information is not intended to replace advice given to you by your health care provider. Make sure you discuss any questions you have with your health care provider. Document Revised: 03/14/2020 Document Reviewed: 03/14/2020 Elsevier Patient Education  2021 Elsevier Inc.  

## 2020-11-17 ENCOUNTER — Other Ambulatory Visit: Payer: Self-pay

## 2020-11-17 ENCOUNTER — Ambulatory Visit (INDEPENDENT_AMBULATORY_CARE_PROVIDER_SITE_OTHER): Payer: BC Managed Care – PPO | Admitting: Gastroenterology

## 2020-11-17 ENCOUNTER — Encounter: Payer: Self-pay | Admitting: Gastroenterology

## 2020-11-17 VITALS — BP 133/88 | HR 87 | Temp 97.3°F | Ht 68.0 in | Wt 181.6 lb

## 2020-11-17 DIAGNOSIS — K5900 Constipation, unspecified: Secondary | ICD-10-CM | POA: Diagnosis not present

## 2020-11-17 DIAGNOSIS — K921 Melena: Secondary | ICD-10-CM

## 2020-11-17 NOTE — Progress Notes (Signed)
Cc'ed to pcp °

## 2020-11-17 NOTE — Patient Instructions (Addendum)
Please find out if any family history of colon cancer and let us know.  When wiping, just "pat" and you can use the hemorrhoid wipes if needed. It's important to only sit on toilet for 2-3 minutes at a time, no straining, and avoid constipation. If you don't, hemorrhoids can come back. I do believe you likely have very small ones.   You can use the rectal cream twice a day for 7 days, then stop.  For constipation: increase foods in your diet that are high fiber. The nuts help you go, so you can eat those. Drink at least 5-6 bottles of water a day if you can.   We will see you in 6 weeks and arrange a colonoscopy then if needed!   I enjoyed seeing you again today! As you know, I value our relationship and want to provide genuine, compassionate, and quality care. I welcome your feedback. If you receive a survey regarding your visit,  I greatly appreciate you taking time to fill this out. See you next time!  [name], PhD, ANP-BC [facility]   High-Fiber Eating Plan Fiber, also called dietary fiber, is a type of carbohydrate. It is found foods such as fruits, vegetables, whole grains, and beans. A high-fiber diet can have many health benefits. Your health care provider may recommend a high-fiber diet to help:  Prevent constipation. Fiber can make your bowel movements more regular.  Lower your cholesterol.  Relieve the following conditions: ? Inflammation of veins in the anus (hemorrhoids). ? Inflammation of specific areas of the digestive tract (uncomplicated diverticulosis). ? A problem of the large intestine, also called the colon, that sometimes causes pain and diarrhea (irritable bowel syndrome, or IBS).  Prevent overeating as part of a weight-loss plan.  Prevent heart disease, type 2 diabetes, and certain cancers. What are tips for following this plan? Reading food labels  Check the nutrition facts label on food products for the amount of dietary fiber. Choose  foods that have 5 grams of fiber or more per serving.  The goals for recommended daily fiber intake include: ? Men (age 35 or younger): 34-38 g. ? Men (over age 73): 28-34 g. ? Women (age 50 or younger): 25-28 g. ? Women (over age 103): 22-25 g. Your daily fiber goal is _____________ g.   Shopping  Choose whole fruits and vegetables instead of processed forms, such as apple juice or applesauce.  Choose a wide variety of high-fiber foods such as avocados, lentils, oats, and kidney beans.  Read the nutrition facts label of the foods you choose. Be aware of foods with added fiber. These foods often have high sugar and sodium amounts per serving. Cooking  Use whole-grain flour for baking and cooking.  Cook with brown rice instead of white rice. Meal planning  Start the day with a breakfast that is high in fiber, such as a cereal that contains 5 g of fiber or more per serving.  Eat breads and cereals that are made with whole-grain flour instead of refined flour or white flour.  Eat brown rice, bulgur wheat, or millet instead of white rice.  Use beans in place of meat in soups, salads, and pasta dishes.  Be sure that half of the grains you eat each day are whole grains. General information  You can get the recommended daily intake of dietary fiber by: ? Eating a variety of fruits, vegetables, grains, nuts, and beans. ? Taking a fiber supplement if you are not able  to take in enough fiber in your diet. It is better to get fiber through food than from a supplement.  Gradually increase how much fiber you consume. If you increase your intake of dietary fiber too quickly, you may have bloating, cramping, or gas.  Drink plenty of water to help you digest fiber.  Choose high-fiber snacks, such as berries, raw vegetables, nuts, and popcorn. What foods should I eat? Fruits Berries. Pears. Apples. Oranges. Avocado. Prunes and raisins. Dried figs. Vegetables Sweet potatoes. Spinach. Kale.  Artichokes. Cabbage. Broccoli. Cauliflower. Green peas. Carrots. Squash. Grains Whole-grain breads. Multigrain cereal. Oats and oatmeal. Brown rice. Barley. Bulgur wheat. [place]. Quinoa. Bran muffins. Popcorn. Rye wafer crackers. Meats and other proteins Navy beans, kidney beans, and pinto beans. Soybeans. Split peas. Lentils. Nuts and seeds. Dairy Fiber-fortified yogurt. Beverages Fiber-fortified soy milk. Fiber-fortified orange juice. Other foods Fiber bars. The items listed above may not be a complete list of recommended foods and beverages. Contact a dietitian for more information. What foods should I avoid? Fruits Fruit juice. Cooked, strained fruit. Vegetables Fried potatoes. Canned vegetables. Well-cooked vegetables. Grains White bread. Pasta made with refined flour. White rice. Meats and other proteins Fatty cuts of meat. Fried chicken or fried fish. Dairy Milk. Yogurt. Cream cheese. Sour cream. Fats and oils Butters. Beverages Soft drinks. Other foods Cakes and pastries. The items listed above may not be a complete list of foods and beverages to avoid. Talk with your dietitian about what choices are best for you. Summary  Fiber is a type of carbohydrate. It is found in foods such as fruits, vegetables, whole grains, and beans.  A high-fiber diet has many benefits. It can help to prevent constipation, lower blood cholesterol, aid weight loss, and reduce your risk of heart disease, diabetes, and certain cancers.  Increase your intake of fiber gradually. Increasing fiber too quickly may cause cramping, bloating, and gas. Drink plenty of water while you increase the amount of fiber you consume.  The best sources of fiber include whole fruits and vegetables, whole grains, nuts, seeds, and beans. This information is not intended to replace advice given to you by your health care provider. Make sure you discuss any questions you have with your health care provider. Document  Revised: 01/07/2020 Document Reviewed: 01/07/2020 Elsevier Patient Education  2021 [employer].

## 2020-11-17 NOTE — Progress Notes (Signed)
Referring Provider: Kathyrn Drown, MD Primary Care Physician:  Kathyrn Drown, MD Primary GI: Dr. Gala Romney   Chief Complaint  Patient presents with  . Hemorrhoids    Sometimes sees blood when wipes  . Constipation    "some"    HPI:   Steven Gallagher is a 45 y.o. male presenting today with a history of symptomatic hemorrhoids, s/p banding X 3in 2019, neutral position in Sept 2020, last colonoscopy 2018. Also noted to have chronic gas/bloating with history of SBBO and responding well to Xifaxan in the past. Another round given in Oct 2021. Negative ifobt in Oct 2021.   Took Xifaxan. Mild improvement in gas and bloating. Constipation intermittently. Sometimes feels "rubbing" inside after BM. No pain with BM. Itching occasionally but no burning. Sees blood with every BM. Feels like it's about the same. Little specks of blood on tissue and some streaks. Bright red blood. No prolapsing tissue. Sometimes straining with bowel movements. Hasn't been taking fiber lately. Gives him gas. Colace hurts his stomach, feels like it's burning. Not as productive BMs recently. Sitting too long on the toilet.   Linzess 72 mcg with diarrhea. When eating mixed nuts, will have good bowel movement. 3 bottles of water a day.    Past Medical History:  Diagnosis Date  . Dizziness   . Elevated blood pressure   . GERD (gastroesophageal reflux disease)   . Hyperglycemia   . Hyperlipidemia   . Hyperlipidemia 12/17/2019  . Hypertension     Past Surgical History:  Procedure Laterality Date  . BACTERIAL OVERGROWTH TEST N/A 04/22/2015   Procedure: BACTERIAL OVERGROWTH TEST;  Surgeon: Daneil Dolin, MD;  Location: [facility];  Service: Endoscopy;  Laterality: N/A;  0700  . COLONOSCOPY N/A 07/03/2017   dr. Gala Romney: small internal hemorrhoids, otherwise normal  . ESOPHAGOGASTRODUODENOSCOPY N/A 07/03/2017   Dr. Gala Romney: normal esophagus s/p dilation, small hiatal hernia, normal duodenum  . MALONEY DILATION N/A  07/03/2017   Procedure: Venia Minks DILATION;  Surgeon: Daneil Dolin, MD;  Location: [facility];  Service: Endoscopy;  Laterality: N/A;    Current Outpatient Medications  Medication Sig Dispense Refill  . amLODipine (NORVASC) 5 MG tablet Take 1 tablet (5 mg total) by mouth daily. 90 tablet 1  . azelastine (ASTELIN) 0.1 % nasal spray Place 2 sprays into both nostrils 2 (two) times daily. 30 mL 12  . docusate sodium (COLACE) 100 MG capsule Take 100 mg by mouth daily.    . Fiber POWD Take by mouth daily. As needed    . hydrocortisone (ANUSOL-HC) 2.5 % rectal cream Place 1 application rectally 2 (two) times daily. (Patient taking differently: Place 1 application rectally 2 (two) times daily. As needed) 30 g 1  . pantoprazole (PROTONIX) 40 MG tablet Take 1 tablet (40 mg total) by mouth daily. 90 tablet 1  . rosuvastatin (CRESTOR) 5 MG tablet Take 1 tablet (5 mg total) by mouth daily. 90 tablet 1  . sertraline (ZOLOFT) 100 MG tablet Take 1 tablet (100 mg total) by mouth daily. 90 tablet 1  . valACYclovir (VALTREX) 500 MG tablet Take 1 tablet (500 mg total) by mouth daily. 90 tablet 1  . ondansetron (ZOFRAN ODT) 8 MG disintegrating tablet Take 1 tablet (8 mg total) by mouth every 8 (eight) hours as needed for nausea or vomiting. (Patient not taking: No sig reported) 20 tablet 0   No current facility-administered medications for this visit.    Allergies as of  11/17/2020  . (No Known Allergies)    Family History  Problem Relation Age of Onset  . Hypertension Mother   . Hypertension Father   . Hypertension Brother   . Colon cancer Neg Hx   . Colon polyps Neg Hx     Social History   Socioeconomic History  . Marital status: Married    Spouse name: Not on file  . Number of children: Not on file  . Years of education: Not on file  . Highest education level: Not on file  Occupational History  . Not on file  Tobacco Use  . Smoking status: Never Smoker  . Smokeless tobacco: Never Used   Vaping Use  . Vaping Use: Never used  Substance and Sexual Activity  . Alcohol use: No  . Drug use: No  . Sexual activity: Not on file  Other Topics Concern  . Not on file  Social History Narrative  . Not on file   Social Determinants of Health   Financial Resource Strain: Not on file  Food Insecurity: Not on file  Transportation Needs: Not on file  Physical Activity: Not on file  Stress: Not on file  Social Connections: Not on file    Review of Systems: Gen: Denies fever, chills, anorexia. Denies fatigue, weakness, weight loss.  CV: Denies chest pain, palpitations, syncope, peripheral edema, and claudication. Resp: Denies dyspnea at rest, cough, wheezing, coughing up blood, and pleurisy. GI: see HPI Derm: Denies rash, itching, dry skin Psych: Denies depression, anxiety, memory loss, confusion. No homicidal or suicidal ideation.  Heme: see HPI  Physical Exam: BP 133/88   Pulse 87   Temp (!) 97.3 F (36.3 C) (Temporal)   Ht 5\' 8"  (1.727 m)   Wt 181 lb 9.6 oz (82.4 kg)   BMI 27.61 kg/m  General:   Alert and oriented. No distress noted. Pleasant and cooperative.  Head:  Normocephalic and atraumatic. Eyes:  Conjuctiva clear without scleral icterus. Mouth:  Mask in place Rectal: irritation/excorated posterioly but no fissure. Internal exam without mass. Banding attempted but not enough tissue to capture. Banding procedure aborted.  Msk:  Symmetrical without gross deformities. Normal posture. Extremities:  Without edema. Neurologic:  Alert and  oriented x4 Psych:  Alert and cooperative. Normal mood and affect.  ASSESSMENT/PLAN: Steven Gallagher is a 45 y.o. male presenting today with a history of symptomatic hemorrhoids, s/p banding X 3in 2019, neutral position in Sept 2020, last colonoscopy 2018. Also noted to have chronic gas/bloating with history of SBBO and responding well to Xifaxan in the past. Another round given in Oct 2021. Negative ifobt in Oct 2021.    Presents in follow-up with intermittent constipation. Prior Linzess 72 mcg too strong. He does note improvement with certain dietary measures and supplemental fiber causes gas. At this point, his best option would be to focus moreso on dietary modification, increase water intake, and avoid prolonged toilet time. He continues to sit for long periods on toilet. Rectal exam with small excoriated place posteriorly perirectally and admits to wiping multiple times. Query if this is where scant blood is coming from. Banding attempted but not successful today as not enough tissue present.   We discussed if he continues to see small volume bleeding, that I recommend a colonoscopy. In interim, will focus on dietary/behavior measures.   Return in 6 weeks.  [name], PhD, ANP-BC [facility]

## 2020-12-23 ENCOUNTER — Ambulatory Visit: Payer: BC Managed Care – PPO | Admitting: Gastroenterology

## 2021-01-04 ENCOUNTER — Ambulatory Visit: Payer: BC Managed Care – PPO | Admitting: Gastroenterology

## 2021-02-06 DIAGNOSIS — I10 Essential (primary) hypertension: Secondary | ICD-10-CM | POA: Diagnosis not present

## 2021-02-06 DIAGNOSIS — Z114 Encounter for screening for human immunodeficiency virus [HIV]: Secondary | ICD-10-CM | POA: Diagnosis not present

## 2021-02-06 DIAGNOSIS — E7849 Other hyperlipidemia: Secondary | ICD-10-CM | POA: Diagnosis not present

## 2021-02-06 DIAGNOSIS — R7303 Prediabetes: Secondary | ICD-10-CM | POA: Diagnosis not present

## 2021-02-07 ENCOUNTER — Other Ambulatory Visit: Payer: Self-pay

## 2021-02-07 ENCOUNTER — Encounter: Payer: Self-pay | Admitting: Gastroenterology

## 2021-02-07 ENCOUNTER — Telehealth: Payer: Self-pay

## 2021-02-07 ENCOUNTER — Ambulatory Visit (INDEPENDENT_AMBULATORY_CARE_PROVIDER_SITE_OTHER): Payer: BC Managed Care – PPO | Admitting: Gastroenterology

## 2021-02-07 ENCOUNTER — Encounter: Payer: Self-pay | Admitting: Family Medicine

## 2021-02-07 VITALS — BP 136/80 | HR 91 | Temp 97.3°F | Ht 68.0 in | Wt 182.2 lb

## 2021-02-07 DIAGNOSIS — K921 Melena: Secondary | ICD-10-CM | POA: Diagnosis not present

## 2021-02-07 LAB — HEPATIC FUNCTION PANEL
ALT: 16 IU/L (ref 0–44)
AST: 16 IU/L (ref 0–40)
Albumin: 4.8 g/dL (ref 4.0–5.0)
Alkaline Phosphatase: 90 IU/L (ref 44–121)
Bilirubin Total: 0.3 mg/dL (ref 0.0–1.2)
Bilirubin, Direct: <0.1 mg/dL (ref 0.00–0.40)
Total Protein: 7.3 g/dL (ref 6.0–8.5)

## 2021-02-07 LAB — BASIC METABOLIC PANEL
BUN/Creatinine Ratio: 15 (ref 9–20)
BUN: 17 mg/dL (ref 6–24)
CO2: 23 mmol/L (ref 20–29)
Calcium: 9.1 mg/dL (ref 8.7–10.2)
Chloride: 103 mmol/L (ref 96–106)
Creatinine, Ser: 1.14 mg/dL (ref 0.76–1.27)
Glucose: 107 mg/dL — ABNORMAL HIGH (ref 65–99)
Potassium: 4.4 mmol/L (ref 3.5–5.2)
Sodium: 141 mmol/L (ref 134–144)
eGFR: 82 mL/min/{1.73_m2} (ref >59)

## 2021-02-07 LAB — LIPID PANEL
Chol/HDL Ratio: 3.5 ratio (ref 0.0–5.0)
Cholesterol, Total: 185 mg/dL (ref 100–199)
HDL: 53 mg/dL (ref >39)
LDL Chol Calc (NIH): 107 mg/dL — ABNORMAL HIGH (ref 0–99)
Triglycerides: 141 mg/dL (ref 0–149)
VLDL Cholesterol Cal: 25 mg/dL (ref 5–40)

## 2021-02-07 LAB — HEMOGLOBIN A1C
Est. average glucose Bld gHb Est-mCnc: 126 mg/dL
Hgb A1c MFr Bld: 6 % — ABNORMAL HIGH (ref 4.8–5.6)

## 2021-02-07 LAB — HIV ANTIBODY (ROUTINE TESTING W REFLEX): HIV Screen 4th Generation wRfx: NONREACTIVE

## 2021-02-07 NOTE — Patient Instructions (Signed)
We are arranging a flexible sigmoidoscopy with Dr. Gala Romney in the near future.  Avoid straining, limit toilet time to 2-3 minutes.  I recommend adding Benefiber 2 teaspoons daily to your regimen. You can increase this to three times a day if tolerated!  We will see you in 6 months!  I enjoyed seeing you again today! As you know, I value our relationship and want to provide genuine, compassionate, and quality care. I welcome your feedback. If you receive a survey regarding your visit,  I greatly appreciate you taking time to fill this out. See you next time!  [name], PhD, ANP-BC [facility]

## 2021-02-07 NOTE — Progress Notes (Signed)
Referring Provider: Kathyrn Drown, MD Primary Care Physician:  Kathyrn Drown, MD Primary GI: Dr. Gala Romney  Chief Complaint  Patient presents with  . Constipation    Hemorrhoids, occasional bleeding     HPI:   EMMITTE SURGEON is a 45 y.o. male presenting today with a history of symptomatic hemorrhoids, s/p banding X 3in 2019, neutral position in Sept 2020, last colonoscopy 2018. Also noted to have chronic gas/bloating with history of SIBO and responding well to Xifaxan in the past. Another round given in Oct 2021. Negative ifobt in Oct 2021.  Linzess 72 mcg caused diarrhea in the past. Historically has sat for long periods of time on toilet.   With having a BM sometimes feels like something is rubbing internally.   He has been using his finger with a wipe wrapped around it and cleaning inside vigorously in past. Wife told him to stop this. Historically had seen pea-sized amount of blood but no longer sees this since stopping internally "wiping". No rectal pain.   Majority of time has small bowel movements. Takes fiber powder but not often as he should. Eating a lot of nuts has a good BM. Not taking fiber daily. Desires evaluation further. Would like to pursue flex sig due to his concern of something internally that he feels with a BM.   Past Medical History:  Diagnosis Date  . Dizziness   . Elevated blood pressure   . GERD (gastroesophageal reflux disease)   . Hyperglycemia   . Hyperlipidemia   . Hyperlipidemia 12/17/2019  . Hypertension     Past Surgical History:  Procedure Laterality Date  . BACTERIAL OVERGROWTH TEST N/A 04/22/2015   Procedure: BACTERIAL OVERGROWTH TEST;  Surgeon: Daneil Dolin, MD;  Location: [facility];  Service: Endoscopy;  Laterality: N/A;  0700  . COLONOSCOPY N/A 07/03/2017   dr. Gala Romney: small internal hemorrhoids, otherwise normal  . ESOPHAGOGASTRODUODENOSCOPY N/A 07/03/2017   Dr. Gala Romney: normal esophagus s/p dilation, small hiatal hernia, normal  duodenum  . MALONEY DILATION N/A 07/03/2017   Procedure: Venia Minks DILATION;  Surgeon: Daneil Dolin, MD;  Location: [facility];  Service: Endoscopy;  Laterality: N/A;    Current Outpatient Medications  Medication Sig Dispense Refill  . amLODipine (NORVASC) 5 MG tablet Take 1 tablet (5 mg total) by mouth daily. 90 tablet 1  . azelastine (ASTELIN) 0.1 % nasal spray Place 2 sprays into both nostrils 2 (two) times daily. 30 mL 12  . Fiber POWD Take by mouth daily. As needed    . hydrocortisone (ANUSOL-HC) 2.5 % rectal cream Place 1 application rectally 2 (two) times daily. (Patient taking differently: Place 1 application rectally 2 (two) times daily. As needed) 30 g 1  . pantoprazole (PROTONIX) 40 MG tablet Take 1 tablet (40 mg total) by mouth daily. 90 tablet 1  . rosuvastatin (CRESTOR) 5 MG tablet Take 1 tablet (5 mg total) by mouth daily. 90 tablet 1  . valACYclovir (VALTREX) 500 MG tablet Take 1 tablet (500 mg total) by mouth daily. 90 tablet 1  . docusate sodium (COLACE) 100 MG capsule Take 100 mg by mouth daily. (Patient not taking: Reported on 02/07/2021)    . sertraline (ZOLOFT) 100 MG tablet Take 1 tablet (100 mg total) by mouth daily. (Patient not taking: Reported on 02/07/2021) 90 tablet 1   No current facility-administered medications for this visit.    Allergies as of 02/07/2021  . (No Known Allergies)    Family History  Problem Relation Age of Onset  . Hypertension Mother   . Hypertension Father   . Hypertension Brother   . Colon cancer Neg Hx   . Colon polyps Neg Hx     Social History   Socioeconomic History  . Marital status: Married    Spouse name: Not on file  . Number of children: Not on file  . Years of education: Not on file  . Highest education level: Not on file  Occupational History  . Not on file  Tobacco Use  . Smoking status: Never Smoker  . Smokeless tobacco: Never Used  Vaping Use  . Vaping Use: Never used  Substance and Sexual Activity  .  Alcohol use: No  . Drug use: No  . Sexual activity: Not on file  Other Topics Concern  . Not on file  Social History Narrative  . Not on file   Social Determinants of Health   Financial Resource Strain: Not on file  Food Insecurity: Not on file  Transportation Needs: Not on file  Physical Activity: Not on file  Stress: Not on file  Social Connections: Not on file    Review of Systems: Gen: Denies fever, chills, anorexia. Denies fatigue, weakness, weight loss.  CV: Denies chest pain, palpitations, syncope, peripheral edema, and claudication. Resp: Denies dyspnea at rest, cough, wheezing, coughing up blood, and pleurisy. GI: see HPI Derm: Denies rash, itching, dry skin Psych: Denies depression, anxiety, memory loss, confusion. No homicidal or suicidal ideation.  Heme: Denies bruising, bleeding, and enlarged lymph nodes.  Physical Exam: BP 136/80   Pulse 91   Temp (!) 97.3 F (36.3 C) (Temporal)   Ht 5\' 8"  (1.727 m)   Wt 182 lb 3.2 oz (82.6 kg)   BMI 27.70 kg/m  General:   Alert and oriented. No distress noted. Pleasant and cooperative.  Head:  Normocephalic and atraumatic. Eyes:  Conjuctiva clear without scleral icterus. Mouth:  Mask in place Abdomen:  +BS, soft, non-tender and non-distended. No rebound or guarding. No HSM or masses noted. Msk:  Symmetrical without gross deformities. Normal posture. Extremities:  Without edema. Neurologic:  Alert and  oriented x4 Psych:  Alert and cooperative. Normal mood and affect.  ASSESSMENT: Steven Gallagher is a 45 y.o. male presenting today with history of symptomatic hemorrhoids that responded well to banding X 3in 2019, neutral position in Sept 2020, and last colonoscopy 2018. Negative ifobt in Oct 2021. Mild constipation noted at times.   He had noted very scant, pea-sized amount of blood on tissue several months ago and had been vigorously cleaning, somewhat fixated on rectum cleanliness in the past. I had asked him to stop  vigorous wiping, and he tells me that he had also been using a wipe/cloth around his finger and inserting into rectum to clean until his wife advised him to stop this as well. Since that time, he has not had any further overt bleeding. He does note the sensation of a "rubbing" internally as stool passes. Rectal exams during prior visits have not revealed any obvious abnormalities. He desires further evaluation. As colonoscopy fairly recent (2018 with hemorrhoids), we discussed a flex sig, which he wants to pursue.   Constipation: I have asked him to resume fiber consistently. Linzess even at low doses has caused diarrhea. I have also asked him to cut down significantly on toilet time.   PLAN:  Proceed with flex sig by Dr. Gala Romney in near future: the risks, benefits, and alternatives have been discussed  with the patient in detail. The patient states understanding and desires to proceed.  Advised to take Benefiber consistently  Avoid vigorous wiping  Limit toilet time to 2-3 minutes  6 month return  [name], PhD, [facility]

## 2021-02-07 NOTE — Telephone Encounter (Addendum)
Will call pt to schedule Flex Sig ASA 2 w/Propofol w/Dr. Gala Romney when next schedules are available.

## 2021-02-16 NOTE — Progress Notes (Signed)
CC'ED TO PCP 

## 2021-02-21 NOTE — Telephone Encounter (Signed)
Called pt, LMOVM to call back to schedule 

## 2021-02-22 NOTE — Telephone Encounter (Signed)
Patient returned call. He wants to know if he should just go ahead and have a colonoscopy done instead? Please advise Steven Gallagher thanks!

## 2021-02-22 NOTE — Telephone Encounter (Signed)
Letter mailed

## 2021-02-23 NOTE — Telephone Encounter (Signed)
Pt called office, he decided to do flex sig as originally planned instead of colonoscopy. Flex sig scheduled for 04/03/21 at 11:15am. Orders entered. Instructions mailed.  No PA needed for flex sig per AIM website.  FYI to Roseanne Kaufman NP.

## 2021-03-06 ENCOUNTER — Encounter: Payer: Self-pay | Admitting: Family Medicine

## 2021-03-06 ENCOUNTER — Ambulatory Visit (INDEPENDENT_AMBULATORY_CARE_PROVIDER_SITE_OTHER): Payer: BC Managed Care – PPO | Admitting: Family Medicine

## 2021-03-06 ENCOUNTER — Other Ambulatory Visit: Payer: Self-pay

## 2021-03-06 VITALS — BP 126/84 | HR 87 | Temp 97.9°F | Ht 68.0 in | Wt 181.0 lb

## 2021-03-06 DIAGNOSIS — I10 Essential (primary) hypertension: Secondary | ICD-10-CM | POA: Diagnosis not present

## 2021-03-06 DIAGNOSIS — R7303 Prediabetes: Secondary | ICD-10-CM

## 2021-03-06 DIAGNOSIS — E7849 Other hyperlipidemia: Secondary | ICD-10-CM | POA: Diagnosis not present

## 2021-03-06 DIAGNOSIS — K219 Gastro-esophageal reflux disease without esophagitis: Secondary | ICD-10-CM | POA: Diagnosis not present

## 2021-03-06 DIAGNOSIS — F411 Generalized anxiety disorder: Secondary | ICD-10-CM

## 2021-03-06 MED ORDER — PANTOPRAZOLE SODIUM 40 MG PO TBEC: 40.0000 mg | DELAYED_RELEASE_TABLET | Freq: Every day | ORAL | 1 refills | Status: DC

## 2021-03-06 MED ORDER — VALACYCLOVIR HCL 500 MG PO TABS: 500.0000 mg | ORAL_TABLET | Freq: Every day | ORAL | 1 refills | Status: DC

## 2021-03-06 MED ORDER — SERTRALINE HCL 100 MG PO TABS: 100.0000 mg | ORAL_TABLET | Freq: Every day | ORAL | 1 refills | Status: DC

## 2021-03-06 MED ORDER — ROSUVASTATIN CALCIUM 5 MG PO TABS: 5.0000 mg | ORAL_TABLET | Freq: Every day | ORAL | 1 refills | Status: DC

## 2021-03-06 MED ORDER — AMLODIPINE BESYLATE 5 MG PO TABS: 5.0000 mg | ORAL_TABLET | Freq: Every day | ORAL | 1 refills | Status: DC

## 2021-03-06 NOTE — Patient Instructions (Signed)
Results for orders placed or performed in visit on 09/26/20  Lipid panel  Result Value Ref Range   Cholesterol, Total 185 100 - 199 mg/dL   Triglycerides 141 0 - 149 mg/dL   HDL 53 >39 mg/dL   VLDL Cholesterol Cal 25 5 - 40 mg/dL   LDL Chol Calc (NIH) 107 (H) 0 - 99 mg/dL   Chol/HDL Ratio 3.5 0.0 - 5.0 ratio  Hepatic function panel  Result Value Ref Range   Total Protein 7.3 6.0 - 8.5 g/dL   Albumin 4.8 4.0 - 5.0 g/dL   Bilirubin Total 0.3 0.0 - 1.2 mg/dL   Bilirubin, Direct <0.10 0.00 - 0.40 mg/dL   Alkaline Phosphatase 90 44 - 121 IU/L   AST 16 0 - 40 IU/L   ALT 16 0 - 44 IU/L  Basic metabolic panel  Result Value Ref Range   Glucose 107 (H) 65 - 99 mg/dL   BUN 17 6 - 24 mg/dL   Creatinine, Ser 1.14 0.76 - 1.27 mg/dL   eGFR 82 >59 mL/min/1.73   BUN/Creatinine Ratio 15 9 - 20   Sodium 141 134 - 144 mmol/L   Potassium 4.4 3.5 - 5.2 mmol/L   Chloride 103 96 - 106 mmol/L   CO2 23 20 - 29 mmol/L   Calcium 9.1 8.7 - 10.2 mg/dL  HIV Antibody (routine testing w rflx)  Result Value Ref Range   HIV Screen 4th Generation wRfx Non Reactive Non Reactive  Hemoglobin A1c  Result Value Ref Range   Hgb A1c MFr Bld 6.0 (H) 4.8 - 5.6 %   Est. average glucose Bld gHb Est-mCnc 126 mg/dL      Diabetes Mellitus and Nutrition, Adult When you have diabetes, or diabetes mellitus, it is very important to have healthy eating habits because your blood sugar (glucose) levels are greatly affected by what you eat and drink. Eating healthy foods in the right amounts, at about the same times every day, can help you: Control your blood glucose. Lower your risk of heart disease. Improve your blood pressure. Reach or maintain a healthy weight. What can affect my meal plan? Every person with diabetes is different, and each person has different needs for a meal plan. Your health care provider may recommend that you work with a dietitian to make a meal plan that is best for you. Your meal plan may vary  depending on factors such as: The calories you need. The medicines you take. Your weight. Your blood glucose, blood pressure, and cholesterol levels. Your activity level. Other health conditions you have, such as heart or kidney disease. How do carbohydrates affect me? Carbohydrates, also called carbs, affect your blood glucose level more than any other type of food. Eating carbs naturally raises the amount of glucose in your blood. Carb counting is a method for keeping track of how many carbs you eat. Counting carbs is important to keep your blood glucose at a healthy level,especially if you use insulin or take certain oral diabetes medicines. It is important to know how many carbs you can safely have in each meal. This is different for every person. Your dietitian can help you calculate how manycarbs you should have at each meal and for each snack. How does alcohol affect me? Alcohol can cause a sudden decrease in blood glucose (hypoglycemia), especially if you use insulin or take certain oral diabetes medicines. Hypoglycemia can be a life-threatening condition. Symptoms of hypoglycemia, such as sleepiness, dizziness, and confusion, are similar to  symptoms of having too much alcohol. Do not drink alcohol if: Your health care provider tells you not to drink. You are pregnant, may be pregnant, or are planning to become pregnant. If you drink alcohol: Do not drink on an empty stomach. Limit how much you use to: 0-1 drink a day for women. 0-2 drinks a day for men. Be aware of how much alcohol is in your drink. In the [place] one drink equals one 12 oz bottle of beer (355 mL), one 5 oz glass of wine (148 mL), or one 1 oz glass of hard liquor (44 mL). Keep yourself hydrated with water, diet soda, or unsweetened iced tea. Keep in mind that regular soda, juice, and other mixers may contain a lot of sugar and must be counted as carbs. What are tips for following this plan?  Reading food  labels Start by checking the serving size on the "Nutrition Facts" label of packaged foods and drinks. The amount of calories, carbs, fats, and other nutrients listed on the label is based on one serving of the item. Many items contain more than one serving per package. Check the total grams (g) of carbs in one serving. You can calculate the number of servings of carbs in one serving by dividing the total carbs by 15. For example, if a food has 30 g of total carbs per serving, it would be equal to 2 servings of carbs. Check the number of grams (g) of saturated fats and trans fats in one serving. Choose foods that have a low amount or none of these fats. Check the number of milligrams (mg) of salt (sodium) in one serving. Most people should limit total sodium intake to less than 2,300 mg per day. Always check the nutrition information of foods labeled as "low-fat" or "nonfat." These foods may be higher in added sugar or refined carbs and should be avoided. Talk to your dietitian to identify your daily goals for nutrients listed on the label. Shopping Avoid buying canned, pre-made, or processed foods. These foods tend to be high in fat, sodium, and added sugar. Shop around the outside edge of the grocery store. This is where you will most often find fresh fruits and vegetables, bulk grains, fresh meats, and fresh dairy. Cooking Use low-heat cooking methods, such as baking, instead of high-heat cooking methods like deep frying. Cook using healthy oils, such as olive, canola, or sunflower oil. Avoid cooking with butter, cream, or high-fat meats. Meal planning Eat meals and snacks regularly, preferably at the same times every day. Avoid going long periods of time without eating. Eat foods that are high in fiber, such as fresh fruits, vegetables, beans, and whole grains. Talk with your dietitian about how many servings of carbs you can eat at each meal. Eat 4-6 oz (112-168 g) of lean protein each day,  such as lean meat, chicken, fish, eggs, or tofu. One ounce (oz) of lean protein is equal to: 1 oz (28 g) of meat, chicken, or fish. 1 egg.  cup (62 g) of tofu. Eat some foods each day that contain healthy fats, such as avocado, nuts, seeds, and fish. What foods should I eat? Fruits Berries. Apples. Oranges. Peaches. Apricots. Plums. Grapes. Mango. Papaya.Pomegranate. Kiwi. Cherries. Vegetables Lettuce. Spinach. Leafy greens, including kale, chard, collard greens, and mustard greens. Beets. Cauliflower. Cabbage. Broccoli. Carrots. Green beans.Tomatoes. Peppers. Onions. Cucumbers. Brussels sprouts. Grains Whole grains, such as whole-wheat or whole-grain bread, crackers, tortillas,cereal, and pasta. Unsweetened oatmeal. Quinoa. Owens Shark or wild  rice. Meats and other proteins Seafood. Poultry without skin. Lean cuts of poultry and beef. Tofu. Nuts. Seeds. Dairy Low-fat or fat-free dairy products such as milk, yogurt, and cheese. The items listed above may not be a complete list of foods and beverages you can eat. Contact a dietitian for more information. What foods should I avoid? Fruits Fruits canned with syrup. Vegetables Canned vegetables. Frozen vegetables with butter or cream sauce. Grains Refined white flour and flour products such as bread, pasta, snack foods, andcereals. Avoid all processed foods. Meats and other proteins Fatty cuts of meat. Poultry with skin. Breaded or fried meats. Processed meat.Avoid saturated fats. Dairy Full-fat yogurt, cheese, or milk. Beverages Sweetened drinks, such as soda or iced tea. The items listed above may not be a complete list of foods and beverages you should avoid. Contact a dietitian for more information. Questions to ask a health care provider Do I need to meet with a diabetes educator? Do I need to meet with a dietitian? What number can I call if I have questions? When are the best times to check my blood glucose? Where to find more  information: American Diabetes Association: diabetes.org Academy of Nutrition and Dietetics: www.eatright.[employer] of Diabetes and Digestive and Kidney Diseases: [employer] Association of Diabetes Care and Education Specialists: www.diabeteseducator.org Summary It is important to have healthy eating habits because your blood sugar (glucose) levels are greatly affected by what you eat and drink. A healthy meal plan will help you control your blood glucose and maintain a healthy lifestyle. Your health care provider may recommend that you work with a dietitian to make a meal plan that is best for you. Keep in mind that carbohydrates (carbs) and alcohol have immediate effects on your blood glucose levels. It is important to count carbs and to use alcohol carefully. This information is not intended to replace advice given to you by your health care provider. Make sure you discuss any questions you have with your healthcare provider. Document Revised: 08/11/2019 Document Reviewed: 08/11/2019 Elsevier Patient Education  2021 [employer].

## 2021-03-06 NOTE — Progress Notes (Signed)
   Subjective:    Patient ID: Steven Gallagher, male    DOB: [birth year], 45 y.o.   MRN: [id number]  Hyperlipidemia This is a chronic problem. Treatments tried: crestor. Compliance problems include adherence to exercise (takes med every day, tries to somewhat eat healthy).    Gastroesophageal reflux disease without esophagitis  Prediabetes  Other hyperlipidemia  Elevated blood pressure reading in office with diagnosis of hypertension  Generalized anxiety disorder He relates compliance with medicine Tries to watch his diet Trying to stay active Please see documentation below  Review of Systems     Objective:   Physical Exam  General-in no acute distress Eyes-no discharge Lungs-respiratory rate normal, CTA CV-no murmurs,RRR Extremities skin warm dry no edema Neuro grossly normal Behavior normal, alert       Assessment & Plan:  1. Gastroesophageal reflux disease without esophagitis Under good control.  We did discuss dietary measures to reduce acid.  Patient states he has a lot of "acid on his teeth "we also discussed sticking with medication  2. Prediabetes A1c stable.  Minimize starches in diet.  Drinks 1 soda a day.  Patient to limit sugary drinks.  3. Other hyperlipidemia Takes his cholesterol medicine looks significantly better.  Continue current medication.  Check labs again in 6 months  4. Elevated blood pressure reading in office with diagnosis of hypertension Blood pressure good control medication renewal.  Recent labs look good.  5. Generalized anxiety disorder Patient not depressed currently doing well with Zoloft keeps his anxiety under good control.  Patient relates a fair amount of gas but he also takes FiberCon powder we did discuss potentially cutting this back a little bit to see if it would help  Follow-up 6 months

## 2021-04-03 ENCOUNTER — Ambulatory Visit (HOSPITAL_COMMUNITY)
Admission: RE | Admit: 2021-04-03 | Discharge: 2021-04-03 | Disposition: A | Discharge status: Home / Self Care | Payer: BC Managed Care – PPO | Attending: Internal Medicine | Admitting: Internal Medicine

## 2021-04-03 ENCOUNTER — Encounter (HOSPITAL_COMMUNITY): Payer: Self-pay | Admitting: Internal Medicine

## 2021-04-03 ENCOUNTER — Other Ambulatory Visit: Payer: Self-pay

## 2021-04-03 ENCOUNTER — Encounter (HOSPITAL_COMMUNITY)
Admission: RE | Disposition: A | Discharge status: Home / Self Care | Payer: Self-pay | Source: Home / Self Care | Attending: Internal Medicine

## 2021-04-03 ENCOUNTER — Ambulatory Visit (HOSPITAL_COMMUNITY): Payer: BC Managed Care – PPO | Admitting: Anesthesiology

## 2021-04-03 ENCOUNTER — Encounter: Payer: Self-pay | Admitting: Internal Medicine

## 2021-04-03 DIAGNOSIS — K648 Other hemorrhoids: Secondary | ICD-10-CM | POA: Diagnosis not present

## 2021-04-03 DIAGNOSIS — K625 Hemorrhage of anus and rectum: Secondary | ICD-10-CM | POA: Diagnosis not present

## 2021-04-03 DIAGNOSIS — F419 Anxiety disorder, unspecified: Secondary | ICD-10-CM | POA: Diagnosis not present

## 2021-04-03 DIAGNOSIS — Z79899 Other long term (current) drug therapy: Secondary | ICD-10-CM | POA: Insufficient documentation

## 2021-04-03 HISTORY — PX: FLEXIBLE SIGMOIDOSCOPY: SHX5431

## 2021-04-03 SURGERY — SIGMOIDOSCOPY, FLEXIBLE
Anesthesia: General

## 2021-04-03 MED ORDER — PROPOFOL 10 MG/ML IV BOLUS
INTRAVENOUS | Status: DC | PRN
  Administered 2021-04-03: 50 mg via INTRAVENOUS
  Administered 2021-04-03: 100 mg via INTRAVENOUS

## 2021-04-03 MED ORDER — LACTATED RINGERS IV SOLN: INTRAVENOUS | Status: DC

## 2021-04-03 NOTE — Anesthesia Preprocedure Evaluation (Addendum)
Anesthesia Evaluation  Patient identified by MRN, date of birth, ID band Patient awake    Reviewed: Allergy & Precautions, NPO status , Patient's Chart, lab work & pertinent test results  Airway Mallampati: II  TM Distance: >3 FB Neck ROM: Full    Dental  (+) Dental Advisory Given, Missing   Pulmonary neg pulmonary ROS,    Pulmonary exam normal breath sounds clear to auscultation       Cardiovascular Exercise Tolerance: Good hypertension, Pt. on medications Normal cardiovascular exam Rhythm:Regular Rate:Normal     Neuro/Psych  Headaches, PSYCHIATRIC DISORDERS Anxiety    GI/Hepatic Neg liver ROS, GERD  Medicated and Controlled,  Endo/Other  negative endocrine ROS  Renal/GU negative Renal ROS     Musculoskeletal negative musculoskeletal ROS (+)   Abdominal   Peds  Hematology negative hematology ROS (+)   Anesthesia Other Findings   Reproductive/Obstetrics negative OB ROS                            Anesthesia Physical Anesthesia Plan  ASA: 2  Anesthesia Plan: General   Post-op Pain Management:    Induction: Intravenous  PONV Risk Score and Plan: Propofol infusion  Airway Management Planned: Nasal Cannula and Natural Airway  Additional Equipment:   Intra-op Plan:   Post-operative Plan:   Informed Consent: I have reviewed the patients History and Physical, chart, labs and discussed the procedure including the risks, benefits and alternatives for the proposed anesthesia with the patient or authorized representative who has indicated his/her understanding and acceptance.     Dental advisory given  Plan Discussed with: CRNA and Surgeon  Anesthesia Plan Comments:         Anesthesia Quick Evaluation

## 2021-04-03 NOTE — Anesthesia Postprocedure Evaluation (Signed)
Anesthesia Post Note  Patient: Steven Gallagher  Procedure(s) Performed: FLEXIBLE SIGMOIDOSCOPY WITH PROPOFOL  Patient location during evaluation: Endoscopy Anesthesia Type: General Level of consciousness: awake and alert and oriented Pain management: pain level controlled Vital Signs Assessment: post-procedure vital signs reviewed and stable Respiratory status: spontaneous breathing and respiratory function stable Cardiovascular status: blood pressure returned to baseline and stable Postop Assessment: no apparent nausea or vomiting Anesthetic complications: no   No notable events documented.   Last Vitals:  Vitals:   04/03/21 1008 04/03/21 1031  BP: 134/86 (!) 103/59  Pulse: 85   Resp: (!) 9 18  Temp: 36.6 C 36.9 C  SpO2: 99% 96%    Last Pain:  Vitals:   04/03/21 1031  TempSrc: Oral  PainSc: 0-No pain                 Treyvion Durkee C Jacobi Ryant

## 2021-04-03 NOTE — Transfer of Care (Signed)
Immediate Anesthesia Transfer of Care Note  Patient: Steven Gallagher  Procedure(s) Performed: FLEXIBLE SIGMOIDOSCOPY WITH PROPOFOL  Patient Location: Endoscopy Unit  Anesthesia Type:General  Level of Consciousness: awake, alert  and oriented  Airway & Oxygen Therapy: Patient Spontanous Breathing  Post-op Assessment: Report given to RN and Post -op Vital signs reviewed and stable  Post vital signs: Reviewed and stable  Last Vitals:  Vitals Value Taken Time  BP    Temp    Pulse    Resp    SpO2      Last Pain:  Vitals:   04/03/21 1008  TempSrc: Oral  PainSc: 0-No pain         Complications: No notable events documented.

## 2021-04-03 NOTE — Op Note (Signed)
[facility] Patient Name: Steven Gallagher Procedure Date: 04/03/2021 10:01 AM MRN: [id number] Date of Birth: 1975-12-10 Attending MD: Steven Gallagher , MD CSN: [id number] Age: 45 Admit Type: Outpatient Procedure:                Flexible Sigmoidoscopy Indications:              Rectal hemorrhage Providers:                Steven Richards, MD, Steven Gallagher, Steven Gallagher, Technician Referring MD:              Medicines:                Propofol per Anesthesia Complications:            No immediate complications. Estimated Blood Loss:     Estimated blood loss: none. Procedure:                Pre-Anesthesia Assessment:                           - Prior to the procedure, a History and Physical                            was performed, and patient medications and                            allergies were reviewed. The patient's tolerance of                            previous anesthesia was also reviewed. The risks                            and benefits of the procedure and the sedation                            options and risks were discussed with the patient.                            All questions were answered, and informed consent                            was obtained. Prior Anticoagulants: The patient has                            taken no previous anticoagulant or antiplatelet                            agents. ASA Grade Assessment: II - A patient with                            mild systemic disease. After reviewing the risks  and benefits, the patient was deemed in                            satisfactory condition to undergo the procedure.                           After obtaining informed consent, the scope was                            passed under direct vision. The CF-[id number] ([id number])                            scope was introduced through the anus and advanced                            to the the splenic  flexure. The flexible                            sigmoidoscopy was accomplished without difficulty.                            The patient tolerated the procedure well. The                            quality of the bowel preparation was adequate. Scope In: 10:25:59 AM Scope Out: 10:28:41 AM Total Procedure Duration: 0 hours 2 minutes 42 seconds  Findings:      The perianal and digital rectal examinations were normal. Mild       hemorrhoids found on retroflexion; otherwise, rectal mucosa appeared       normal. Scope was advanced a nice one-to-one fashion 60 cm. I do believe       the area the splenic flexure was reached. The colonic mucosa at this       level appeared normal. From this lobe scope was slowly withdrawn and all       previous imaging concerns were again seen. Above findings were       reconfirmed. Impression:               Mild internal hemorrhoids otherwise negative                            flexible sigmoidoscopy to 60 cm Moderate Sedation:      Moderate (conscious) sedation was personally administered by an       anesthesia professional. The following parameters were monitored: oxygen       saturation, heart rate, blood pressure, respiratory rate, EKG, adequacy       of pulmonary ventilation, and response to care. Recommendation:           - Patient has a contact number available for                            emergencies. The signs and symptoms of potential                            delayed complications were discussed with the  patient. Return to normal activities tomorrow.                            Written discharge instructions were provided to the                            patient.                           - Advance diet as tolerated. Fiber supplement with                            Benefiber 2 tablespoons every day without fail.                            Office visit with [place] in 6 weeks. Procedure Code(s):        --- Professional  ---                           [phone], Sigmoidoscopy, flexible; diagnostic,                            including collection of specimen(s) by brushing or                            washing, when performed (separate procedure) Diagnosis Code(s):        --- Professional ---                           K62.5, Hemorrhage of anus and rectum CPT copyright 2019 American Medical Association. All rights reserved. The codes documented in this report are preliminary and upon coder review may  be revised to meet current compliance requirements. Steven Gallagher. Steven Tedesco, MD Steven Richards, MD 04/03/2021 10:49:22 AM This report has been signed electronically. Number of Addenda: 0

## 2021-04-03 NOTE — Discharge Instructions (Addendum)
  Sigmoidoscopy Discharge Instructions  Read the instructions outlined below and refer to this sheet in the next few weeks. These discharge instructions provide you with general information on caring for yourself after you leave the hospital. Your doctor may also give you specific instructions. While your treatment has been planned according to the most current medical practices available, unavoidable complications occasionally occur. If you have any problems or questions after discharge, call Dr. Gala Romney at [phone]. ACTIVITY You may resume your regular activity, but move at a slower pace for the next 24 hours.  Take frequent rest periods for the next 24 hours.  Walking will help get rid of the air and reduce the bloated feeling in your belly (abdomen).  No driving for 24 hours (because of the medicine (anesthesia) used during the test).   Do not sign any important legal documents or operate any machinery for 24 hours (because of the anesthesia used during the test).  NUTRITION Drink plenty of fluids.  You may resume your normal diet as instructed by your doctor.  Begin with a light meal and progress to your normal diet. Heavy or fried foods are harder to digest and may make you feel sick to your stomach (nauseated).  Avoid alcoholic beverages for 24 hours or as instructed.  MEDICATIONS You may resume your normal medications unless your doctor tells you otherwise.  WHAT YOU CAN EXPECT TODAY Some feelings of bloating in the abdomen.  Passage of more gas than usual.  Spotting of blood in your stool or on the toilet paper.  IF YOU HAD POLYPS REMOVED DURING THE COLONOSCOPY: No aspirin products for 7 days or as instructed.  No alcohol for 7 days or as instructed.  Eat a soft diet for the next 24 hours.  FINDING OUT THE RESULTS OF YOUR TEST Not all test results are available during your visit. If your test results are not back during the visit, make an appointment with your caregiver to find out  the results. Do not assume everything is normal if you have not heard from your caregiver or the medical facility. It is important for you to follow up on all of your test results.  SEEK IMMEDIATE MEDICAL ATTENTION IF: You have more than a spotting of blood in your stool.  Your belly is swollen (abdominal distention).  You are nauseated or vomiting.  You have a temperature over 101.  You have abdominal pain or discomfort that is severe or gets worse throughout the day.    Hemorrhoids only found today  Hemorrhoid information provided  Return for screening colonoscopy in 2028  Office visit with Roseanne Kaufman in 6 weeks--OFFICE TO CALL WITH APPOINTMENT  Please take Benefiber 2 tablespoons every day without fail

## 2021-04-03 NOTE — H&P (Signed)
@LOGO @   Primary Care Physician:  Kathyrn Drown, MD Primary Gastroenterologist:  Dr. Gala Romney  Pre-Procedure History & Physical: HPI:  Steven Gallagher is a 45 y.o. male here for sigmoidoscopy.  History of scant paper hematochezia with vigorous wiping.  Fecal immune occult blood test negative recently through the office.  Has known hemorrhoids.  Status post banding.  Inconsistent with fiber supplementation.  Marginally on 2018 colonoscopy.  Past Medical History:  Diagnosis Date   Dizziness    Elevated blood pressure    GERD (gastroesophageal reflux disease)    Hyperglycemia    Hyperlipidemia    Hyperlipidemia 12/17/2019   Hypertension     Past Surgical History:  Procedure Laterality Date   BACTERIAL OVERGROWTH TEST N/A 04/22/2015   Procedure: BACTERIAL OVERGROWTH TEST;  Surgeon: Daneil Dolin, MD;  [facility];  Service: Endoscopy;  Laterality: N/A;  0700   COLONOSCOPY N/A 07/03/2017   dr. Gala Romney: small internal hemorrhoids, otherwise normal   ESOPHAGOGASTRODUODENOSCOPY N/A 07/03/2017   Dr. Gala Romney: normal esophagus s/p dilation, small hiatal hernia, normal duodenum   MALONEY DILATION N/A 07/03/2017   Procedure: MALONEY DILATION;  Surgeon: Daneil Dolin, MD;  [facility]: [facility];  Service: Endoscopy;  Laterality: N/A;    Prior to Admission medications   Medication Sig Start Date End Date Taking? Authorizing Provider  amLODipine (NORVASC) 5 MG tablet Take 1 tablet (5 mg total) by mouth daily. 03/06/21  Yes Luking, Elayne Snare, MD  azelastine (ASTELIN) 0.1 % nasal spray Place 2 sprays into both nostrils 2 (two) times daily. 09/26/20  Yes Kathyrn Drown, MD  Fiber POWD Take by mouth daily. As needed   Yes [provider]  hydrocortisone (ANUSOL-HC) 2.5 % rectal cream Place 1 application rectally 2 (two) times daily. Patient taking differently: Place 1 application rectally 2 (two) times daily. As needed 10/13/19  Yes [name], NP  pantoprazole (PROTONIX) 40 MG  tablet Take 1 tablet (40 mg total) by mouth daily. 03/06/21  Yes Kathyrn Drown, MD  rosuvastatin (CRESTOR) 5 MG tablet Take 1 tablet (5 mg total) by mouth daily. 03/06/21  Yes Kathyrn Drown, MD  sertraline (ZOLOFT) 100 MG tablet Take 1 tablet (100 mg total) by mouth daily. 03/06/21  Yes Kathyrn Drown, MD  Simethicone (GAS-X PO) Take by mouth.   Yes [provider]  valACYclovir (VALTREX) 500 MG tablet Take 1 tablet (500 mg total) by mouth daily. 03/06/21  Yes Kathyrn Drown, MD    Allergies as of 02/23/2021   (No Known Allergies)    Family History  Problem Relation Age of Onset   Hypertension Mother    Hypertension Father    Prostate cancer Father    Hypertension Brother    Colon cancer Neg Hx    Colon polyps Neg Hx     Social History   Socioeconomic History   Marital status: Married    Spouse name: Not on file   Number of children: Not on file   Years of education: Not on file   Highest education level: Not on file  Occupational History   Not on file  Tobacco Use   Smoking status: Never   Smokeless tobacco: Never  Vaping Use   Vaping Use: Never used  Substance and Sexual Activity   Alcohol use: No   Drug use: No   Sexual activity: Not on file  Other Topics Concern   Not on file  Social History Narrative  Not on file   Social Determinants of Health   Financial Resource Strain: Not on file  Food Insecurity: Not on file  Transportation Needs: Not on file  Physical Activity: Not on file  Stress: Not on file  Social Connections: Not on file  Intimate Partner Violence: Not on file    Review of Systems: See HPI, otherwise negative ROS  Physical Exam: There were no vitals taken for this visit. General:   Alert,  Well-developed, well-nourished, pleasant and cooperative in NAD SNeck:  Supple; no masses or thyromegaly. No significant cervical adenopathy. Lungs:  Clear throughout to auscultation.   No wheezes, crackles, or rhonchi. No acute  distress. Heart:  Regular rate and rhythm; no murmurs, clicks, rubs,  or gallops. Abdomen: Non-distended, normal bowel sounds.  Soft and nontender without appreciable mass or hepatosplenomegaly.  Pulses:  Normal pulses noted. Extremities:  Without clubbing or edema.  Impression/Plan: 45 year old gentleman with a scant paper hematochezia in the setting of known symptomatic hemorrhoids. Sigmoidoscopy today per plan.  Risk, benefits limitations have been reviewed.  Questions answered.  Patient is agreeable.   Notice: This dictation was prepared with Dragon dictation along with smaller phrase technology. Any transcriptional errors that result from this process are unintentional and may not be corrected upon review.

## 2021-04-07 ENCOUNTER — Encounter (HOSPITAL_COMMUNITY): Payer: Self-pay | Admitting: Internal Medicine

## 2021-05-09 ENCOUNTER — Ambulatory Visit: Payer: BC Managed Care – PPO | Admitting: Gastroenterology

## 2021-06-22 ENCOUNTER — Encounter: Payer: Self-pay | Admitting: Family Medicine

## 2021-07-07 IMAGING — CT CT CERVICAL SPINE W/O CM
3 of 5 series · 11 of 33 positions shown, 13 images · non-contrast
Comparison: Cervical spine CT 09/03/2019.

CLINICAL DATA: 43-year-old male status post right C6 facet/lamina
fracture last month.

EXAM:
CT CERVICAL SPINE WITHOUT CONTRAST
TECHNIQUE: Multidetector CT imaging of the cervical spine was performed without
intravenous contrast. Multiplanar CT image reconstructions were also
generated.

[Series 3: sag bone c-spine 2.00 sag · sagittal · 0.27mm/px · 5 of 48 slices shown, 6 images]
[im 16/48  bone]
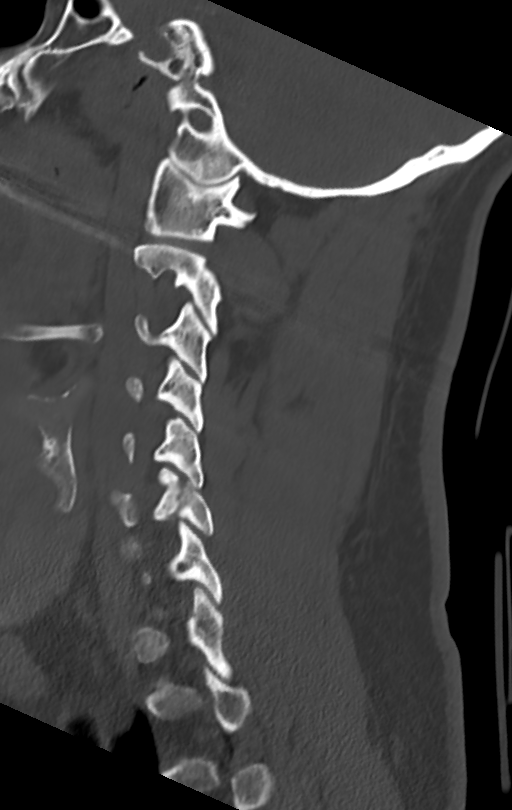
[im 20/48  bone]
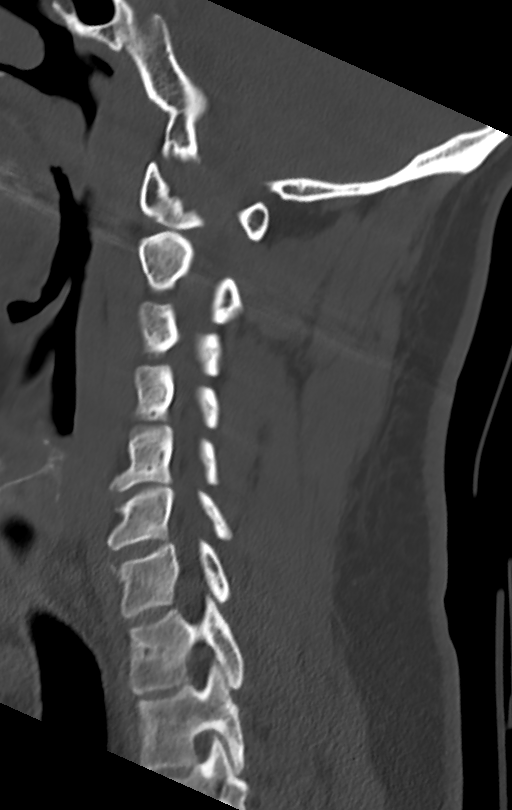
[im 24/48  soft-tissue]
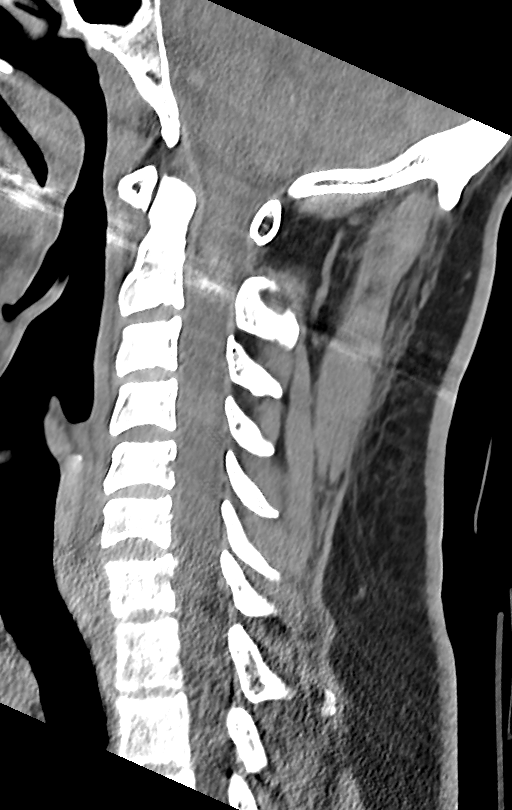
[im 24/48  bone]
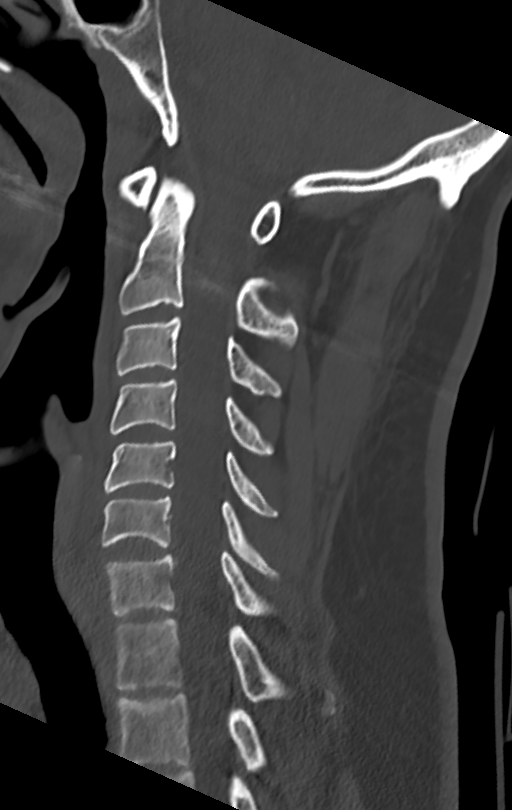
[im 28/48  bone]
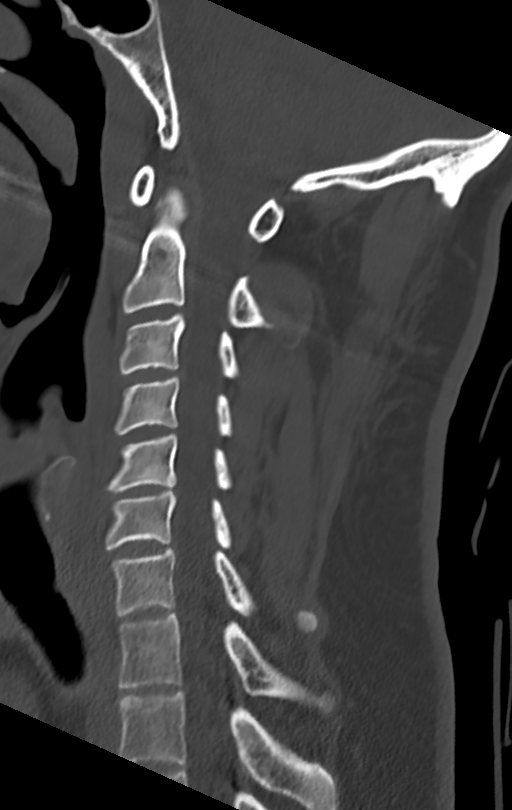
[im 32/48  bone]
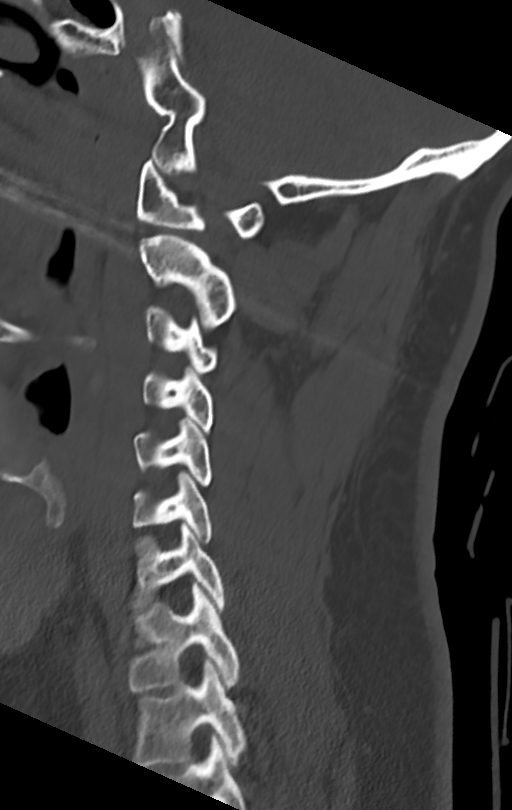

[Series 5: cor bone c-spine 2.00 cor · coronal · 0.23mm/px · 3 of 52 slices shown]
[im 11/52  bone]
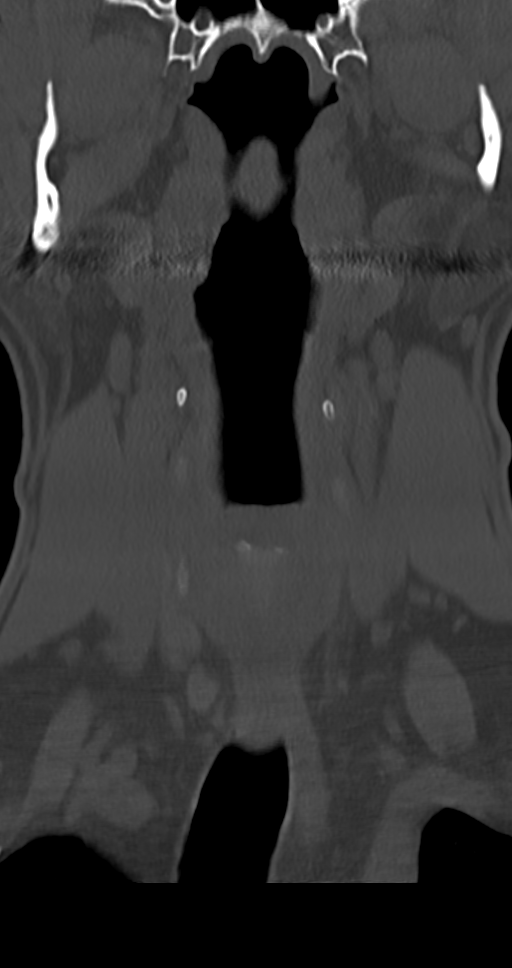
[im 21/52  bone]
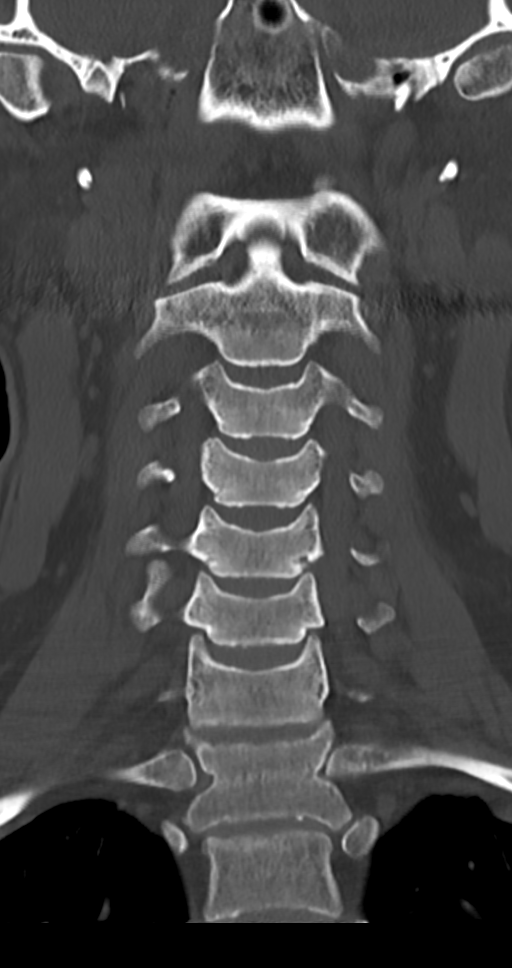
[im 31/52  bone]
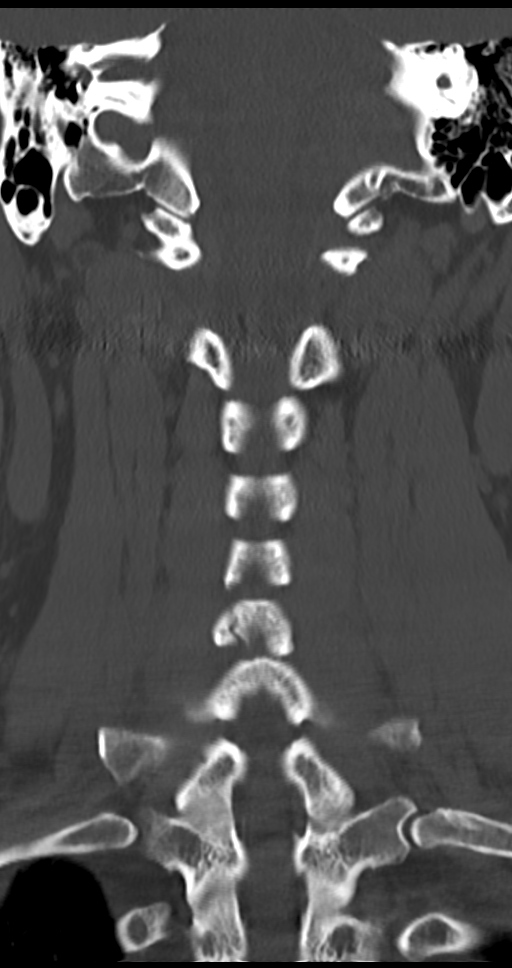

[Series 9: axial (person_name) 2.00 · axial · 0.29mm/px · z∈[-598,-502]mm · 3 of 97 slices shown, 4 images]
[im 25/97  soft-tissue]
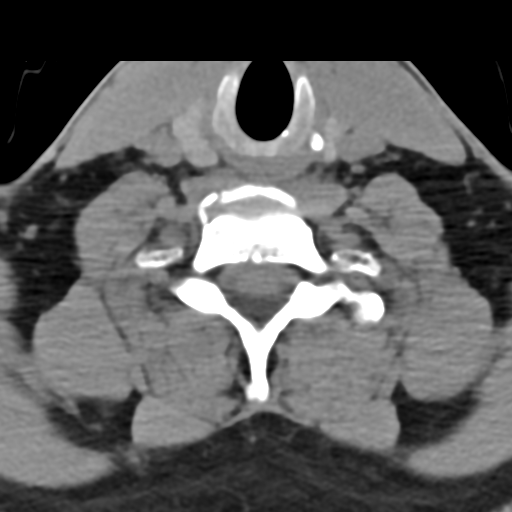
[im 25/97  bone]
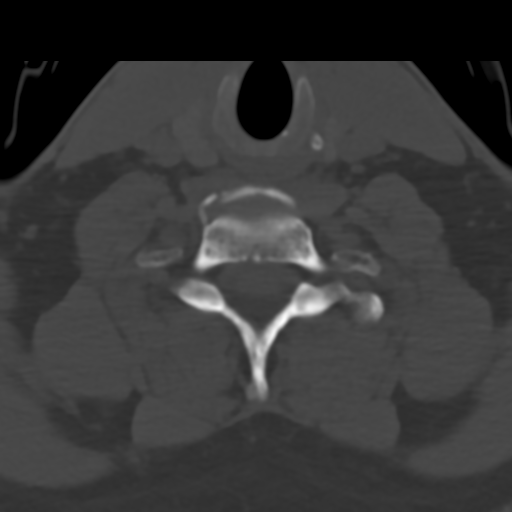
[im 49/97  bone]
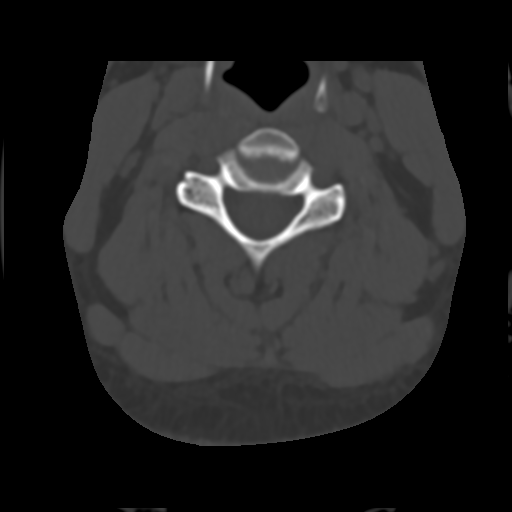
[im 73/97  bone]
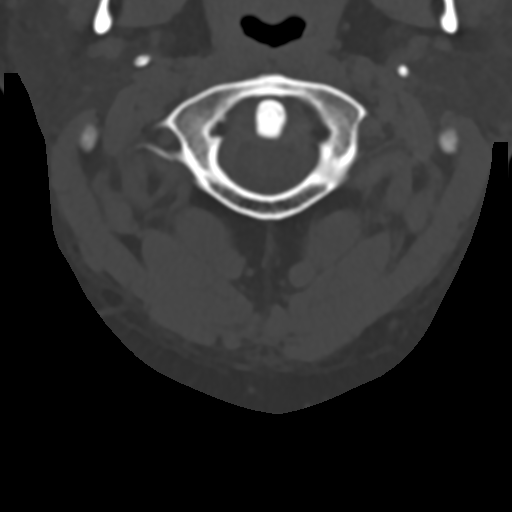

[11 of 33 positions shown; findings below may reference images not displayed]

FINDINGS: Alignment: Subtle anterolisthesis of C6 on C7, not definitely
changed. Increase straightening of cervical lordosis elsewhere.

Skull base and vertebrae: Visualized skull base is intact. No
atlanto-occipital dissociation. Bone mineralization is within normal
limits. C1 through C5 vertebrae remain intact.

Non healed and now slightly distracted fractures of the right C6
facet (series 2, image 61 and sagittal image 16) and medial right
lamina (series 2, image 64). No periosteal new bone formation is
identified. The facet fracture tracks into the posteromedial aspect
of the right C5 pedicle on series 7, image 61, and communicates with
both the right C5-C6 and C6-C7 facet joints.

The C7 vertebra is intact.

Soft tissues and spinal canal: No prevertebral fluid or swelling. No
visible canal hematoma. Negative visible noncontrast posterior
fossa. The left vertebral artery appears dominant. Negative
noncontrast neck soft tissues.

Disc levels:  No significant underlying cervical spine degeneration.

Upper chest: Questionable mild T2 superior endplate compression
fracture related to the trauma last month (sagittal image 25).
Otherwise the visible upper thoracic levels appear intact. Negative
lung apices.

Other: Visible sphenoid sinuses, tympanic cavities and mastoids are
clear.
IMPRESSION: 1. No interval healing healing, and now slight distraction, of the
right C6 facet and medial lamina fractures. Subtle anterolisthesis
of C6 on C7 is stable.
2. No new osseous abnormality in the cervical spine.
3. Questionable mild T2 superior endplate compression fracture
related to last month's trauma.

## 2021-07-26 ENCOUNTER — Ambulatory Visit: Payer: BC Managed Care – PPO | Admitting: Gastroenterology

## 2021-08-15 ENCOUNTER — Encounter: Payer: Self-pay | Admitting: Gastroenterology

## 2021-08-15 ENCOUNTER — Other Ambulatory Visit: Payer: Self-pay

## 2021-08-15 ENCOUNTER — Ambulatory Visit (INDEPENDENT_AMBULATORY_CARE_PROVIDER_SITE_OTHER): Payer: BC Managed Care – PPO | Admitting: Gastroenterology

## 2021-08-15 VITALS — BP 133/88 | HR 85 | Temp 97.5°F | Ht 68.0 in | Wt 183.4 lb

## 2021-08-15 DIAGNOSIS — K641 Second degree hemorrhoids: Secondary | ICD-10-CM | POA: Diagnosis not present

## 2021-08-15 DIAGNOSIS — K5900 Constipation, unspecified: Secondary | ICD-10-CM | POA: Diagnosis not present

## 2021-08-15 NOTE — Progress Notes (Signed)
Referring Provider: Kathyrn Drown, MD Primary Care Physician:  Kathyrn Drown, MD Primary GI: Dr. Gala Romney   Chief Complaint  Patient presents with   Hemorrhoids    No bleeding   Constipation    HPI:   Steven Gallagher is a 45 y.o. male presenting today with a history of symptomatic hemorrhoids, s/p banding X 3 in 2019, neutral position in Sept 2020, last colonoscopy 2018. Underwent flex sig in interim from last appt with mild internal hemorrhoids. Next colonoscopy due in Oct 2023. He notes his brother recently diagnosed with colon cancer.     Has had issues with constipation historically. Taking fiber daily but not helping with constipation. Leakage has returned. No rectal bleeding. Uses flushable wipes. Prolonged toilet time. BM once every 3 days. Believes Linzess may have caused diarrhea in the past and requesting a different agent. Desires banding attempt today.     Past Medical History:  Diagnosis Date   Dizziness    Elevated blood pressure    GERD (gastroesophageal reflux disease)    Hyperglycemia    Hyperlipidemia    Hyperlipidemia 12/17/2019   Hypertension     Past Surgical History:  Procedure Laterality Date   BACTERIAL OVERGROWTH TEST N/A 04/22/2015   Procedure: BACTERIAL OVERGROWTH TEST;  Surgeon: Daneil Dolin, MD;  [facility];  Service: Endoscopy;  Laterality: N/A;  0700   COLONOSCOPY N/A 07/03/2017   dr. Gala Romney: small internal hemorrhoids, otherwise normal   ESOPHAGOGASTRODUODENOSCOPY N/A 07/03/2017   Dr. Gala Romney: normal esophagus s/p dilation, small hiatal hernia, normal duodenum   FLEXIBLE SIGMOIDOSCOPY N/A 04/03/2021   mild internal hemorrhoids.   MALONEY DILATION N/A 07/03/2017   Procedure: Venia Minks DILATION;  Surgeon: Daneil Dolin, MD;  Location[facility];  Service: Endoscopy;  Laterality: N/A;    Current Outpatient Medications  Medication Sig Dispense Refill   amLODipine (NORVASC) 5 MG tablet Take 1 tablet (5 mg total) by  mouth daily. 90 tablet 1   Fiber POWD Take by mouth daily. As needed     pantoprazole (PROTONIX) 40 MG tablet Take 1 tablet (40 mg total) by mouth daily. 90 tablet 1   rosuvastatin (CRESTOR) 5 MG tablet Take 1 tablet (5 mg total) by mouth daily. 90 tablet 1   sertraline (ZOLOFT) 100 MG tablet Take 1 tablet (100 mg total) by mouth daily. 90 tablet 1   Simethicone (GAS-X PO) Take by mouth as needed.     valACYclovir (VALTREX) 500 MG tablet Take 1 tablet (500 mg total) by mouth daily. 90 tablet 1   No current facility-administered medications for this visit.    Allergies as of 08/15/2021   (No Known Allergies)    Family History  Problem Relation Age of Onset   Hypertension Mother    Hypertension Father    Prostate cancer Father    Hypertension Brother    Colon cancer Brother    Colon polyps Neg Hx     Social History   Socioeconomic History   Marital status: Married    Spouse name: Not on file   Number of children: Not on file   Years of education: Not on file   Highest education level: Not on file  Occupational History   Not on file  Tobacco Use   Smoking status: Never   Smokeless tobacco: Never  Vaping Use   Vaping Use: Never used  Substance and Sexual Activity   Alcohol use: No   Drug  use: No   Sexual activity: Not on file  Other Topics Concern   Not on file  Social History Narrative   Not on file   Social Determinants of Health   Financial Resource Strain: Not on file  Food Insecurity: Not on file  Transportation Needs: Not on file  Physical Activity: Not on file  Stress: Not on file  Social Connections: Not on file    Review of Systems: Gen: Denies fever, chills, anorexia. Denies fatigue, weakness, weight loss.  CV: Denies chest pain, palpitations, syncope, peripheral edema, and claudication. Resp: Denies dyspnea at rest, cough, wheezing, coughing up blood, and pleurisy. GI: see HPI Derm: Denies rash, itching, dry skin Psych: Denies depression,  anxiety, memory loss, confusion. No homicidal or suicidal ideation.  Heme: Denies bruising, bleeding, and enlarged lymph nodes.  Physical Exam: BP 133/88   Pulse 85   Temp (!) 97.5 F (36.4 C) (Temporal)   Ht 5\' 8"  (1.727 m)   Wt 183 lb 6.4 oz (83.2 kg)   BMI 27.89 kg/m  General:   Alert and oriented. No distress noted. Pleasant and cooperative.  Head:  Normocephalic and atraumatic. Eyes:  Conjuctiva clear without scleral icterus. Mouth:  mask in place Rectal: no external hemorrhoids or fissure. DRE without mass. See banding note.  Msk:  Symmetrical without gross deformities. Normal posture. Extremities:  Without edema. Neurologic:  Alert and  oriented x4 Psych:  Alert and cooperative. Normal mood and affect.   [place] Banding Note:   Steven Gallagher is a 45 y.o. male presenting today for consideration of hemorrhoid banding.    The patient presents with symptomatic grade 2 hemorrhoids, unresponsive to maximal medical therapy, requesting rubber band ligation of his hemorrhoidal disease. All risks, benefits, and alternative forms of therapy were described and informed consent was obtained.  The decision was made to band the left lateral internal hemorrhoid, and the [place] was used to perform band ligation without complication. Digital anorectal examination was then performed to assure proper positioning of the band, and to adjust the banded tissue as required. The patient was discharged home without pain or other issues. Dietary and behavioral recommendations were given and (if necessary prescriptions were given), along with follow-up instructions. The patient will return in  followup and possible additional banding as required.  No complications were encountered and the patient tolerated the procedure well.   [name], PhD, ANP-BC [facility]    ASSESSMENT/PLAN: Steven Gallagher is a 45 y.o. male presenting today with a history of symptomatic  hemorrhoids, s/p banding X 3 in 2019, neutral position in Sept 2020, last colonoscopy 2018. Underwent flex sig in interim from last appt with mild internal hemorrhoids. Next colonoscopy due in Oct 2023. He notes his brother recently diagnosed with colon cancer.   Recurrent issues with symptomatic hemorrhoids and desires banding today. We discussed aggressive treatment of constipation, as this is exacerbating hemorrhoids.   I banded left lateral today. He is to continue fiber daily. Trial of Trulance 3 mg daily. He would like to avoid Linzess, as he believes this caused diarrhea in the past. He is to call with an update.   Return for banding follow-up. Next colonoscopy fall 2023.   [name], PhD, ANP-BC [facility]

## 2021-08-15 NOTE — Patient Instructions (Signed)
We will see you in follow-up for banding!  For constipation: keep taking fiber. I have given samples of Trulance to try. Take this once each day. Let me know how you like this!  Avoid sitting for more than 2-3 minutes on the toilet. Avoid straining.    I enjoyed seeing you again today! As you know, I value our relationship and want to provide genuine, compassionate, and quality care. I welcome your feedback. If you receive a survey regarding your visit,  I greatly appreciate you taking time to fill this out. See you next time!  [name], PhD, ANP-BC [facility]

## 2021-08-17 ENCOUNTER — Ambulatory Visit: Payer: BC Managed Care – PPO | Admitting: Gastroenterology

## 2021-09-06 ENCOUNTER — Ambulatory Visit (INDEPENDENT_AMBULATORY_CARE_PROVIDER_SITE_OTHER): Payer: BC Managed Care – PPO | Admitting: Gastroenterology

## 2021-09-06 ENCOUNTER — Other Ambulatory Visit: Payer: Self-pay

## 2021-09-06 ENCOUNTER — Encounter: Payer: Self-pay | Admitting: Gastroenterology

## 2021-09-06 VITALS — BP 128/87 | HR 84 | Temp 97.3°F | Ht 68.0 in | Wt 182.6 lb

## 2021-09-06 DIAGNOSIS — K641 Second degree hemorrhoids: Secondary | ICD-10-CM | POA: Diagnosis not present

## 2021-09-06 NOTE — Progress Notes (Signed)
° °  [place] Banding Note:    45 y.o. male presenting today with a history of symptomatic hemorrhoids, s/p banding X 3 in 2019, neutral position in Sept 2020, last colonoscopy 2018. Underwent flex sig in interim from last appt with mild internal hemorrhoids. Next colonoscopy due in Oct 2023. Previously banded left lateral internal hemorrhoid in Nov 2022.   The patient presents with symptomatic grade 2 hemorrhoids, unresponsive to maximal medical therapy, requesting rubber band ligation of his hemorrhoidal disease. All risks, benefits, and alternative forms of therapy were described and informed consent was obtained.  The decision was made to band the right posterior, but insufficient tissue was present. Right anterior was then banded successfully. Digital anorectal examination was then performed to assure proper positioning of the band, and to adjust the banded tissue as required. The patient was discharged home without pain or other issues. Dietary and behavioral recommendations were given and (if necessary prescriptions were given), along with follow-up instructions. I believe this banding will provide relief for the patient. We will see him back as needed. Next colonoscopy in 2023. I have asked him to continue fiber daily and add Miralax as needed each evening. Prescriptive agents have been overshooting the mark, despite smaller dosages.   No complications were encountered and the patient tolerated the procedure well.   [name], PhD, ANP-BC [facility]

## 2021-09-06 NOTE — Patient Instructions (Signed)
Continue Benefiber.  Add Miralax in 1 glass of water as needed if no good bowel movement that day.   Your next colonoscopy will be due in 2023. We will make sure to put you on the recall list.  Please call with any further issues!  I enjoyed seeing you again today! As you know, I value our relationship and want to provide genuine, compassionate, and quality care. I welcome your feedback. If you receive a survey regarding your visit,  I greatly appreciate you taking time to fill this out. See you next time!  [name], PhD, ANP-BC [facility]

## 2021-09-07 ENCOUNTER — Ambulatory Visit: Payer: BC Managed Care – PPO | Admitting: Family Medicine

## 2021-09-13 ENCOUNTER — Telehealth: Payer: Self-pay | Admitting: Internal Medicine

## 2021-09-13 MED ORDER — HYDROCORTISONE (PERIANAL) 2.5 % EX CREA: 1.0000 "application " | TOPICAL_CREAM | Freq: Two times a day (BID) | CUTANEOUS | 1 refills | Status: DC

## 2021-09-13 NOTE — Telephone Encounter (Signed)
Completed. I sent in Anusol cream to use twice a or 7 days to his pharmacy.

## 2021-09-13 NOTE — Telephone Encounter (Signed)
Phoned and LMOVM for the pt regarding Rx being sent in and the instructions. (Pt is at work, ok to leave a message).

## 2021-09-13 NOTE — Telephone Encounter (Signed)
Phoned and LMOVM of pt to call back/check his mychart for message from [employer]

## 2021-09-13 NOTE — Addendum Note (Signed)
Addended by: [name] on: 09/13/2021 11:55 AM   Modules accepted: Orders

## 2021-09-13 NOTE — Telephone Encounter (Signed)
Pt was banded on 12/21 and called today asking about is it normal to be seeing blood in the toilet and when he wipes. He also mentioned he was using preparation H. [phone]

## 2021-09-13 NOTE — Telephone Encounter (Signed)
The pt returned call and advised him of Anna's response from yesterday on the pt's MyChart note and also today she advised on this note. Pt would like to have a stronger cream phoned in to [facility] [address] in [place].

## 2021-09-14 ENCOUNTER — Other Ambulatory Visit: Payer: Self-pay | Admitting: Family Medicine

## 2021-09-14 ENCOUNTER — Ambulatory Visit: Payer: BC Managed Care – PPO | Admitting: Family Medicine

## 2021-09-17 ENCOUNTER — Telehealth: Payer: BC Managed Care – PPO | Admitting: Physician Assistant

## 2021-09-17 DIAGNOSIS — J069 Acute upper respiratory infection, unspecified: Secondary | ICD-10-CM | POA: Diagnosis not present

## 2021-09-17 MED ORDER — IPRATROPIUM BROMIDE 0.03 % NA SOLN: 2.0000 | Freq: Two times a day (BID) | NASAL | 0 refills | Status: DC

## 2021-09-17 MED ORDER — BENZONATATE 100 MG PO CAPS: 100.0000 mg | ORAL_CAPSULE | Freq: Three times a day (TID) | ORAL | 0 refills | Status: DC | PRN

## 2021-09-17 NOTE — Progress Notes (Signed)

## 2021-09-23 ENCOUNTER — Other Ambulatory Visit: Payer: Self-pay | Admitting: Family Medicine

## 2021-09-23 ENCOUNTER — Telehealth: Payer: BC Managed Care – PPO | Admitting: Nurse Practitioner

## 2021-09-23 DIAGNOSIS — J0140 Acute pansinusitis, unspecified: Secondary | ICD-10-CM

## 2021-09-23 MED ORDER — AMOXICILLIN-POT CLAVULANATE 875-125 MG PO TABS: 1.0000 | ORAL_TABLET | Freq: Two times a day (BID) | ORAL | 0 refills | Status: AC

## 2021-09-23 NOTE — Progress Notes (Signed)

## 2021-10-04 ENCOUNTER — Other Ambulatory Visit: Payer: Self-pay | Admitting: Family Medicine

## 2021-10-05 ENCOUNTER — Ambulatory Visit (INDEPENDENT_AMBULATORY_CARE_PROVIDER_SITE_OTHER): Payer: BC Managed Care – PPO | Admitting: Family Medicine

## 2021-10-05 ENCOUNTER — Encounter: Payer: Self-pay | Admitting: Family Medicine

## 2021-10-05 ENCOUNTER — Telehealth: Payer: Self-pay | Admitting: Family Medicine

## 2021-10-05 ENCOUNTER — Other Ambulatory Visit: Payer: Self-pay

## 2021-10-05 VITALS — BP 128/88 | HR 91 | Temp 97.3°F | Ht 68.0 in | Wt 182.0 lb

## 2021-10-05 DIAGNOSIS — I10 Essential (primary) hypertension: Secondary | ICD-10-CM

## 2021-10-05 DIAGNOSIS — R7303 Prediabetes: Secondary | ICD-10-CM | POA: Diagnosis not present

## 2021-10-05 DIAGNOSIS — Z114 Encounter for screening for human immunodeficiency virus [HIV]: Secondary | ICD-10-CM

## 2021-10-05 DIAGNOSIS — E7849 Other hyperlipidemia: Secondary | ICD-10-CM

## 2021-10-05 DIAGNOSIS — J019 Acute sinusitis, unspecified: Secondary | ICD-10-CM

## 2021-10-05 MED ORDER — PANTOPRAZOLE SODIUM 40 MG PO TBEC: 40.0000 mg | DELAYED_RELEASE_TABLET | Freq: Every day | ORAL | 1 refills | Status: DC

## 2021-10-05 MED ORDER — AMLODIPINE BESYLATE 5 MG PO TABS: 5.0000 mg | ORAL_TABLET | Freq: Every day | ORAL | 1 refills | Status: DC

## 2021-10-05 MED ORDER — ROSUVASTATIN CALCIUM 5 MG PO TABS: 5.0000 mg | ORAL_TABLET | Freq: Every day | ORAL | 1 refills | Status: DC

## 2021-10-05 MED ORDER — CEFDINIR 300 MG PO CAPS: 300.0000 mg | ORAL_CAPSULE | Freq: Two times a day (BID) | ORAL | 0 refills | Status: DC

## 2021-10-05 MED ORDER — SERTRALINE HCL 100 MG PO TABS: 100.0000 mg | ORAL_TABLET | Freq: Every day | ORAL | 1 refills | Status: DC

## 2021-10-05 NOTE — Telephone Encounter (Signed)
Labs were ordered at office visit today

## 2021-10-05 NOTE — Telephone Encounter (Signed)
Labs ordered at office visit today

## 2021-10-05 NOTE — Telephone Encounter (Signed)
Patient has physical 01/23/22 and needing labs done

## 2021-10-05 NOTE — Progress Notes (Signed)
° °  Subjective:    Patient ID: Steven Gallagher, male    DOB: 12-Jul-1976, 46 y.o.   MRN: [id number]  HPI 6 month follow up DM, HTN Blood pressure-he takes his medicine regular basis.  Watches diet.  Tries to stay somewhat active.  Could be doing more walking.  States numbers outside the office seem to be doing good  Reflux-uses Protonix every other day seems to keep his symptoms under control denies any setbacks recently  Cholesterol-tries to be healthy with the diet takes his medication denies any problems with the medicine  Moods-stress levels are reasonable.  Keeps his anxiety under good control.  Takes his sertraline benefits from it.  History of prediabetes tries watch portions tries to stay active.  Keeps very busy with his children and family  cough x 2 weeks - E visit gave tessalon pearls, now having head congestion drainage coughing no wheezing or difficulty breathing.  No sinus pain but feels sinus pressure   Review of Systems     Objective:   Physical Exam Gen-NAD not toxic TMS-normal bilateral T- normal no redness Chest-CTA respiratory rate normal no crackles CV RRR no murmur Skin-warm dry Neuro-grossly normal Negative sinus tenderness throat is normal       Assessment & Plan:  1. Essential hypertension Blood pressure good control continue current measures watch salt in diet stay active - Hemoglobin A1c - Lipid panel - Hepatic function panel - Basic metabolic panel - HIV Antibody (routine testing w rflx)  2. Screening for HIV (human immunodeficiency virus) Screening - HIV Antibody (routine testing w rflx)  3. Prediabetes Watch starches check lab work watch portion control.  Stay active - Hemoglobin A1c - Lipid panel - Hepatic function panel - Basic metabolic panel - HIV Antibody (routine testing w rflx)  4. Other hyperlipidemia Minimize fats in diet.  Stick with healthy fats.  Take medication. - Hemoglobin A1c - Lipid panel - Hepatic function  panel - Basic metabolic panel - HIV Antibody (routine testing w rflx)  5. Acute rhinosinusitis Moderate sinus symptoms persistent despite Augmentin for 1 week go with Omnicef twice daily for 10 days may take several weeks for the congestion to totally clear  Follow-up 6 months

## 2021-10-09 ENCOUNTER — Other Ambulatory Visit: Payer: Self-pay | Admitting: Oncology

## 2021-10-09 DIAGNOSIS — J069 Acute upper respiratory infection, unspecified: Secondary | ICD-10-CM

## 2021-10-24 ENCOUNTER — Encounter: Payer: Self-pay | Admitting: Family Medicine

## 2021-10-30 ENCOUNTER — Ambulatory Visit: Payer: BC Managed Care – PPO | Admitting: Family Medicine

## 2022-01-08 ENCOUNTER — Encounter: Payer: Self-pay | Admitting: Family Medicine

## 2022-01-14 ENCOUNTER — Other Ambulatory Visit: Payer: Self-pay | Admitting: Family Medicine

## 2022-01-17 ENCOUNTER — Telehealth: Payer: BC Managed Care – PPO | Admitting: Family Medicine

## 2022-01-17 DIAGNOSIS — J40 Bronchitis, not specified as acute or chronic: Secondary | ICD-10-CM | POA: Diagnosis not present

## 2022-01-18 MED ORDER — ALBUTEROL SULFATE HFA 108 (90 BASE) MCG/ACT IN AERS: 2.0000 | INHALATION_SPRAY | Freq: Four times a day (QID) | RESPIRATORY_TRACT | 0 refills | Status: DC | PRN

## 2022-01-18 MED ORDER — BENZONATATE 100 MG PO CAPS: 100.0000 mg | ORAL_CAPSULE | Freq: Two times a day (BID) | ORAL | 0 refills | Status: DC | PRN

## 2022-01-18 NOTE — Progress Notes (Signed)
We are sorry that you are not feeling well.  Here is how we plan to help! ? ?Based on your presentation I believe you most likely have A cough due to a virus.  This is called viral bronchitis and is best treated by rest, plenty of fluids and control of the cough.  You may use Ibuprofen or Tylenol as directed to help your symptoms.  It might also be due to allergies, making sure you take a daily allergy medication will help as well. ?  ?In addition you may use A prescription cough medication called Tessalon Perles '100mg'$ . You may take 1-2 capsules every 8 hours as needed for your cough. I have also ordered an inhaler to help with coughing episodes. ? ?From your responses in the eVisit questionnaire you describe inflammation in the upper respiratory tract which is causing a significant cough.  This is commonly called Bronchitis and has four common causes:   ?Allergies ?Viral Infections ?Acid Reflux ?Bacterial Infection ?Allergies, viruses and acid reflux are treated by controlling symptoms or eliminating the cause. An example might be a cough caused by taking certain blood pressure medications. You stop the cough by changing the medication. Another example might be a cough caused by acid reflux. Controlling the reflux helps control the cough. ? ?USE OF BRONCHODILATOR ("RESCUE") INHALERS: ?There is a risk from using your bronchodilator too frequently.  The risk is that over-reliance on a medication which only relaxes the muscles surrounding the breathing tubes can reduce the effectiveness of medications prescribed to reduce swelling and congestion of the tubes themselves.  Although you feel brief relief from the bronchodilator inhaler, your asthma may actually be worsening with the tubes becoming more swollen and filled with mucus.  This can delay other crucial treatments, such as oral steroid medications. If you need to use a bronchodilator inhaler daily, several times per day, you should discuss this with your  provider.  There are probably better treatments that could be used to keep your asthma under control.  ?   ?HOME CARE ?Only take medications as instructed by your medical team. ?Complete the entire course of an antibiotic. ?Drink plenty of fluids and get plenty of rest. ?Avoid close contacts especially the very young and the elderly ?Cover your mouth if you cough or cough into your sleeve. ?Always remember to wash your hands ?A steam or ultrasonic humidifier can help congestion.  ? ?GET HELP RIGHT AWAY IF: ?You develop worsening fever. ?You become short of breath ?You cough up blood. ?Your symptoms persist after you have completed your treatment plan ?MAKE SURE YOU  ?Understand these instructions. ?Will watch your condition. ?Will get help right away if you are not doing well or get worse. ?  ? ?Thank you for choosing an e-visit. ? ?Your e-visit answers were reviewed by a board certified advanced clinical practitioner to complete your personal care plan. Depending upon the condition, your plan could have included both over the counter or prescription medications. ? ?Please review your pharmacy choice. Make sure the pharmacy is open so you can pick up prescription now. If there is a problem, you may contact your provider through [employer] and have the prescription routed to another pharmacy.  Your safety is important to [place]. If you have drug allergies check your prescription carefully.  ? ?For the next 24 hours you can use MyChart to ask questions about today's visit, request a non-urgent call back, or ask for a work or school excuse. ?You will get an  e-mail in the next two days asking about your experience. I hope that your e-visit has been valuable and will speed your recovery. ? ?I provided 5 minutes of non face-to-face time during this encounter for chart review, medication and order placement, as well as and documentation.  ? ?

## 2022-01-23 ENCOUNTER — Encounter: Payer: Self-pay | Admitting: Family Medicine

## 2022-01-23 ENCOUNTER — Other Ambulatory Visit: Payer: Self-pay | Admitting: Family Medicine

## 2022-01-23 ENCOUNTER — Ambulatory Visit: Payer: BC Managed Care – PPO | Admitting: Family Medicine

## 2022-01-29 DIAGNOSIS — I10 Essential (primary) hypertension: Secondary | ICD-10-CM | POA: Diagnosis not present

## 2022-01-29 DIAGNOSIS — R7303 Prediabetes: Secondary | ICD-10-CM | POA: Diagnosis not present

## 2022-01-29 DIAGNOSIS — E7849 Other hyperlipidemia: Secondary | ICD-10-CM | POA: Diagnosis not present

## 2022-01-29 DIAGNOSIS — Z114 Encounter for screening for human immunodeficiency virus [HIV]: Secondary | ICD-10-CM | POA: Diagnosis not present

## 2022-01-30 LAB — BASIC METABOLIC PANEL
BUN/Creatinine Ratio: 12 (ref 9–20)
BUN: 13 mg/dL (ref 6–24)
CO2: 22 mmol/L (ref 20–29)
Calcium: 9.5 mg/dL (ref 8.7–10.2)
Chloride: 101 mmol/L (ref 96–106)
Creatinine, Ser: 1.09 mg/dL (ref 0.76–1.27)
Glucose: 107 mg/dL — ABNORMAL HIGH (ref 70–99)
Potassium: 4.4 mmol/L (ref 3.5–5.2)
Sodium: 140 mmol/L (ref 134–144)
eGFR: 86 mL/min/{1.73_m2} (ref >59)

## 2022-01-30 LAB — HEMOGLOBIN A1C
Est. average glucose Bld gHb Est-mCnc: 134 mg/dL
Hgb A1c MFr Bld: 6.3 % — ABNORMAL HIGH (ref 4.8–5.6)

## 2022-01-30 LAB — HEPATIC FUNCTION PANEL
ALT: 21 IU/L (ref 0–44)
AST: 19 IU/L (ref 0–40)
Albumin: 4.6 g/dL (ref 4.0–5.0)
Alkaline Phosphatase: 85 IU/L (ref 44–121)
Bilirubin Total: 0.3 mg/dL (ref 0.0–1.2)
Bilirubin, Direct: 0.11 mg/dL (ref 0.00–0.40)
Total Protein: 7.4 g/dL (ref 6.0–8.5)

## 2022-01-30 LAB — LIPID PANEL
Chol/HDL Ratio: 3.8 ratio (ref 0.0–5.0)
Cholesterol, Total: 169 mg/dL (ref 100–199)
HDL: 44 mg/dL (ref >39)
LDL Chol Calc (NIH): 106 mg/dL — ABNORMAL HIGH (ref 0–99)
Triglycerides: 102 mg/dL (ref 0–149)
VLDL Cholesterol Cal: 19 mg/dL (ref 5–40)

## 2022-01-30 LAB — HIV ANTIBODY (ROUTINE TESTING W REFLEX): HIV Screen 4th Generation wRfx: NONREACTIVE

## 2022-03-05 ENCOUNTER — Encounter: Payer: Self-pay | Admitting: Family Medicine

## 2022-03-05 ENCOUNTER — Ambulatory Visit (INDEPENDENT_AMBULATORY_CARE_PROVIDER_SITE_OTHER): Payer: BC Managed Care – PPO | Admitting: Family Medicine

## 2022-03-05 ENCOUNTER — Other Ambulatory Visit: Payer: Self-pay | Admitting: *Deleted

## 2022-03-05 VITALS — BP 110/76 | HR 96 | Temp 97.7°F | Ht 67.0 in | Wt 177.0 lb

## 2022-03-05 DIAGNOSIS — E7849 Other hyperlipidemia: Secondary | ICD-10-CM

## 2022-03-05 DIAGNOSIS — R7303 Prediabetes: Secondary | ICD-10-CM | POA: Diagnosis not present

## 2022-03-05 DIAGNOSIS — Z1211 Encounter for screening for malignant neoplasm of colon: Secondary | ICD-10-CM

## 2022-03-05 DIAGNOSIS — Z0000 Encounter for general adult medical examination without abnormal findings: Secondary | ICD-10-CM | POA: Diagnosis not present

## 2022-03-05 DIAGNOSIS — Z125 Encounter for screening for malignant neoplasm of prostate: Secondary | ICD-10-CM

## 2022-03-05 DIAGNOSIS — I10 Essential (primary) hypertension: Secondary | ICD-10-CM

## 2022-03-05 DIAGNOSIS — Z8042 Family history of malignant neoplasm of prostate: Secondary | ICD-10-CM

## 2022-03-05 DIAGNOSIS — Z800 Family history of malignant neoplasm of digestive organs: Secondary | ICD-10-CM | POA: Insufficient documentation

## 2022-03-05 HISTORY — DX: Family history of malignant neoplasm of prostate: Z80.42

## 2022-03-05 HISTORY — DX: Family history of malignant neoplasm of digestive organs: Z80.0

## 2022-03-05 MED ORDER — SERTRALINE HCL 100 MG PO TABS: 100.0000 mg | ORAL_TABLET | Freq: Every day | ORAL | 1 refills | Status: DC

## 2022-03-05 MED ORDER — ROSUVASTATIN CALCIUM 10 MG PO TABS: 10.0000 mg | ORAL_TABLET | Freq: Every day | ORAL | 1 refills | Status: DC

## 2022-03-05 MED ORDER — VALACYCLOVIR HCL 500 MG PO TABS
500.0000 mg | ORAL_TABLET | Freq: Every day | ORAL | 1 refills | Status: DC
Start: 2022-03-05 — End: 2022-08-23

## 2022-03-05 MED ORDER — AMLODIPINE BESYLATE 5 MG PO TABS: 5.0000 mg | ORAL_TABLET | Freq: Every day | ORAL | 1 refills | Status: DC

## 2022-03-05 NOTE — Patient Instructions (Addendum)
Hi Steven Gallagher It was good to see you today. Please do your PSA blood work end of September start of October Gastroenterology will call you regarding the appointment for the colonoscopy if you do not hear from them within the next 2 to 3 weeks please let me know We will see you back in 6 months [facility]-Dr. Laureen Abrahams Content in Foods Fiber is a substance that is found in plant foods, such as fruits, vegetables, whole grains, nuts, seeds, and beans. As part of your treatment and recovery plan, your health care provider may recommend that you eat foods that have specific amounts of dietary fiber. Some conditions may require a high-fiber diet while others may require a low-fiber diet. This sheet gives you information about the dietary fiber content of some common foods. Your health care provider will tell you how much fiber you need in your diet. If you have problems or questions, contact your health care provider or dietitian. What foods are high in fiber?  Fruits Blackberries or raspberries (fresh) --  cup (75 g) has 4 g of fiber. Pear (fresh) -- 1 medium (180 g) has 5.5 g of fiber. Prunes (dried) -- 6 to 8 pieces (57-76 g) has 5 g of fiber. Apple with skin -- 1 medium (182 g) has 4.8 g of fiber. Guava -- 1 cup (128 g) has 8.9 g of fiber. Vegetables Peas (frozen) --  cup (80 g) has 4.4 g of fiber. Potato with skin (baked) -- 1 medium (173 g) has 4.4 g of fiber. Pumpkin (canned) --  cup (122 g) has 5 g of fiber. Brussels sprouts (cooked) --  cup (78 g) has 4 g of fiber. Sweet potato --  cup mashed (124 g) has 4 g of fiber. Winter squash -- 1 cup cooked (205 g) has 5.7 g of fiber. Grains Bran cereal --  cup (31 g) has 8.6 g of fiber. Bulgur (cooked) --  cup (70 g) has 4 g of fiber. Quinoa (cooked) -- 1 cup (185 g) has 5.2 g of fiber. Popcorn -- 3 cups (375 g) popped has 5.8 g of fiber. Spaghetti, whole wheat -- 1 cup (140 g) has 6 g of fiber. Meats and other proteins Pinto beans  (cooked) --  cup (90 g) has 7.7 g of fiber. Lentils (cooked) --  cup (90 g) has 7.8 g of fiber. Kidney beans (canned) --  cup (92.5 g) has 5.7 g of fiber. Soybeans (canned, frozen, or fresh) --  cup (92.5 g) has 5.2 g of fiber. Baked beans, plain or vegetarian (canned) --  cup (130 g) has 5.2 g of fiber. Garbanzo beans or chickpeas (canned) --  cup (90 g) has 6.6 g of fiber. Black beans (cooked) --  cup (86 g) has 7.5 g of fiber. White beans or navy beans (cooked) --  cup (91 g) has 9.3 g of fiber. The items listed above may not be a complete list of foods with high fiber. Actual amounts of fiber may be different depending on processing. Contact a dietitian for more information. What foods are moderate in fiber?  Fruits Banana -- 1 medium (126 g) has 3.2 g of fiber. Melon -- 1 cup (155 g) has 1.4 g of fiber. Orange -- 1 small (154 g) has 3.7 g of fiber. Raisins --  cup (40 g) has 1.8 g of fiber. Applesauce, sweetened --  cup (125 g) has 1.5 g of fiber. Blueberries (fresh) --  cup (75 g) has 1.8  g of fiber. Strawberries (fresh, sliced) -- 1 cup (818 g) has 3 g of fiber. Cherries -- 1 cup (140 g) has 2.9 g of fiber. Vegetables Broccoli (cooked) --  cup (77.5 g) has 2.1 g of fiber. Carrots (cooked) --  cup (77.5 g) has 2.2 g of fiber. Corn (canned or frozen) --  cup (82.5 g) has 2.1 g of fiber. Potatoes, mashed --  cup (105 g) has 1.6 g of fiber. Tomato -- 1 medium (62 g) has 1.5 g of fiber. Green beans (canned) --  cup (83 g) has 2 g of fiber. Squash, winter --  cup (58 g) has 1 g of fiber. Sweet potato, baked -- 1 medium (150 g) has 3 g of fiber. Cauliflower (cooked) -- 1/2 cup (90 g) has 2.3 g of fiber. Grains Long-grain brown rice (cooked) -- 1 cup (196 g) has 3.5 g of fiber. Bagel, plain -- one 4-inch (10 cm) bagel has 2 g of fiber. Instant oatmeal --  cup (120 g) has about 2 g of fiber. Macaroni noodles, enriched (cooked) -- 1 cup (140 g) has 2.5 g of  fiber. Multigrain cereal --  cup (15 g) has about 2-4 g of fiber. Whole-wheat bread -- 1 slice (26 g) has 2 g of fiber. Whole-wheat spaghetti noodles --  cup (70 g) has 3.2 g of fiber. Corn tortilla -- one 6-inch (15 cm) tortilla has 1.5 g of fiber. Meats and other proteins Almonds --  cup or 1 oz (28 g) has 3.5 g of fiber. Sunflower seeds in shell --  cup or  oz (11.5 g) has 1.1 g of fiber. Vegetable or soy patty -- 1 patty (70 g) has 3.4 g of fiber. Walnuts --  cup or 1 oz (30 g) has 2 g of fiber. Flax seed -- 1 Tbsp (7 g) has 2.8 g of fiber. The items listed above may not be a complete list of foods that have moderate amounts of fiber. Actual amounts of fiber may be different depending on processing. Contact a dietitian for more information. What foods are low in fiber?  Low-fiber foods contain less than 1 g of fiber per serving. They include: Fruits Fruit juice --  cup or 4 fl oz (118 mL) has 0.5 g of fiber. Vegetables Lettuce -- 1 cup (35 g) has 0.5 g of fiber. Cucumber (slices) --  cup (60 g) has 0.3 g of fiber. Celery -- 1 stalk (40 g) has 0.1 g of fiber. Grains Flour tortilla -- one 6-inch (15 cm) tortilla has 0.5 g of fiber. White rice (cooked) --  cup (81.5 g) has 0.3 g of fiber. Meats and other proteins Egg -- 1 large (50 g) has 0 g of fiber. Meat, poultry, or fish -- 3 oz (85 g) has 0 g of fiber. Dairy Milk -- 1 cup or 8 fl oz (237 mL) has 0 g of fiber. Yogurt -- 1 cup (245 g) has 0 g of fiber. The items listed above may not be a complete list of foods that are low in fiber. Actual amounts of fiber may be different depending on processing. Contact a dietitian for more information. Summary Fiber is a substance that is found in plant foods, such as fruits, vegetables, whole grains, nuts, seeds, and beans. As part of your treatment and recovery plan, your health care provider may recommend that you eat foods that have specific amounts of dietary fiber. This  information is not intended to replace advice given to  you by your health care provider. Make sure you discuss any questions you have with your health care provider. Document Revised: 01/07/2020 Document Reviewed: 01/07/2020 Elsevier Patient Education  [place]. Diabetes Mellitus and Nutrition, Adult When you have diabetes, or diabetes mellitus, it is very important to have healthy eating habits because your blood sugar (glucose) levels are greatly affected by what you eat and drink. Eating healthy foods in the right amounts, at about the same times every day, can help you: Manage your blood glucose. Lower your risk of heart disease. Improve your blood pressure. Reach or maintain a healthy weight. What can affect my meal plan? Every person with diabetes is different, and each person has different needs for a meal plan. Your health care provider may recommend that you work with a dietitian to make a meal plan that is best for you. Your meal plan may vary depending on factors such as: The calories you need. The medicines you take. Your weight. Your blood glucose, blood pressure, and cholesterol levels. Your activity level. Other health conditions you have, such as heart or kidney disease. How do carbohydrates affect me? Carbohydrates, also called carbs, affect your blood glucose level more than any other type of food. Eating carbs raises the amount of glucose in your blood. It is important to know how many carbs you can safely have in each meal. This is different for every person. Your dietitian can help you calculate how many carbs you should have at each meal and for each snack. How does alcohol affect me? Alcohol can cause a decrease in blood glucose (hypoglycemia), especially if you use insulin or take certain diabetes medicines by mouth. Hypoglycemia can be a life-threatening condition. Symptoms of hypoglycemia, such as sleepiness, dizziness, and confusion, are similar to symptoms  of having too much alcohol. Do not drink alcohol if: Your health care provider tells you not to drink. You are pregnant, may be pregnant, or are planning to become pregnant. If you drink alcohol: Limit how much you have to: 0-1 drink a day for women. 0-2 drinks a day for men. Know how much alcohol is in your drink. In the [place] one drink equals one 12 oz bottle of beer (355 mL), one 5 oz glass of wine (148 mL), or one 1 oz glass of hard liquor (44 mL). Keep yourself hydrated with water, diet soda, or unsweetened iced tea. Keep in mind that regular soda, juice, and other mixers may contain a lot of sugar and must be counted as carbs. What are tips for following this plan?  Reading food labels Start by checking the serving size on the Nutrition Facts label of packaged foods and drinks. The number of calories and the amount of carbs, fats, and other nutrients listed on the label are based on one serving of the item. Many items contain more than one serving per package. Check the total grams (g) of carbs in one serving. Check the number of grams of saturated fats and trans fats in one serving. Choose foods that have a low amount or none of these fats. Check the number of milligrams (mg) of salt (sodium) in one serving. Most people should limit total sodium intake to less than 2,300 mg per day. Always check the nutrition information of foods labeled as "low-fat" or "nonfat." These foods may be higher in added sugar or refined carbs and should be avoided. Talk to your dietitian to identify your daily goals for nutrients listed on the label. Shopping  Avoid buying canned, pre-made, or processed foods. These foods tend to be high in fat, sodium, and added sugar. Shop around the outside edge of the grocery store. This is where you will most often find fresh fruits and vegetables, bulk grains, fresh meats, and fresh dairy products. Cooking Use low-heat cooking methods, such as baking, instead of  high-heat cooking methods, such as deep frying. Cook using healthy oils, such as olive, canola, or sunflower oil. Avoid cooking with butter, cream, or high-fat meats. Meal planning Eat meals and snacks regularly, preferably at the same times every day. Avoid going long periods of time without eating. Eat foods that are high in fiber, such as fresh fruits, vegetables, beans, and whole grains. Eat 4-6 oz (112-168 g) of lean protein each day, such as lean meat, chicken, fish, eggs, or tofu. One ounce (oz) (28 g) of lean protein is equal to: 1 oz (28 g) of meat, chicken, or fish. 1 egg.  cup (62 g) of tofu. Eat some foods each day that contain healthy fats, such as avocado, nuts, seeds, and fish. What foods should I eat? Fruits Berries. Apples. Oranges. Peaches. Apricots. Plums. Grapes. Mangoes. Papayas. Pomegranates. Kiwi. Cherries. Vegetables Leafy greens, including lettuce, spinach, kale, chard, collard greens, mustard greens, and cabbage. Beets. Cauliflower. Broccoli. Carrots. Green beans. Tomatoes. Peppers. Onions. Cucumbers. Brussels sprouts. Grains Whole grains, such as whole-wheat or whole-grain bread, crackers, tortillas, cereal, and pasta. Unsweetened oatmeal. Quinoa. Brown or wild rice. Meats and other proteins Seafood. Poultry without skin. Lean cuts of poultry and beef. Tofu. Nuts. Seeds. Dairy Low-fat or fat-free dairy products such as milk, yogurt, and cheese. The items listed above may not be a complete list of foods and beverages you can eat and drink. Contact a dietitian for more information. What foods should I avoid? Fruits Fruits canned with syrup. Vegetables Canned vegetables. Frozen vegetables with butter or cream sauce. Grains Refined white flour and flour products such as bread, pasta, snack foods, and cereals. Avoid all processed foods. Meats and other proteins Fatty cuts of meat. Poultry with skin. Breaded or fried meats. Processed meat. Avoid saturated  fats. Dairy Full-fat yogurt, cheese, or milk. Beverages Sweetened drinks, such as soda or iced tea. The items listed above may not be a complete list of foods and beverages you should avoid. Contact a dietitian for more information. Questions to ask a health care provider Do I need to meet with a certified diabetes care and education specialist? Do I need to meet with a dietitian? What number can I call if I have questions? When are the best times to check my blood glucose? Where to find more information: American Diabetes Association: diabetes.org Academy of Nutrition and Dietetics: eatright.[employer] of Diabetes and Digestive and Kidney Diseases: [employer] Association of Diabetes Care & Education Specialists: diabeteseducator.org Summary It is important to have healthy eating habits because your blood sugar (glucose) levels are greatly affected by what you eat and drink. It is important to use alcohol carefully. A healthy meal plan will help you manage your blood glucose and lower your risk of heart disease. Your health care provider may recommend that you work with a dietitian to make a meal plan that is best for you. This information is not intended to replace advice given to you by your health care provider. Make sure you discuss any questions you have with your health care provider. Document Revised: 04/06/2020 Document Reviewed: 04/06/2020 Elsevier Patient Education  [place].

## 2022-03-05 NOTE — Progress Notes (Signed)
   Subjective:    Patient ID: Steven Gallagher, male    DOB: [birth year], 46 y.o.   MRN: [id number]  HPI  The patient comes in today for a wellness visit. Patient has a brother who has colon cancer Therefore he is due for a colonoscopy every 5 years This would be due this coming October It is wise to get the ball in motion to schedule this test `  A review of their health history was completed.  A review of medications was also completed.  Any needed refills; No  Eating habits: Fruits, vegetables and meats  Falls/  MVA accidents in past few months: No  Regular exercise: Yes, cardio and weight lifting  Specialist pt sees on regular basis: No  Preventative health issues were discussed.   Additional concerns: Wants prostate checked and needs colonoscopy   Review of Systems     Objective:   Physical Exam  General-in no acute distress Eyes-no discharge Lungs-respiratory rate normal, CTA CV-no murmurs,RRR Extremities skin warm dry no edema Neuro grossly normal Behavior normal, alert Prostate exam normal      Assessment & Plan:  1. Well adult exam Adult wellness-complete.wellness physical was conducted today. Importance of diet and exercise were discussed in detail.  Importance of stress reduction and healthy living were discussed.  In addition to this a discussion regarding safety was also covered.  We also reviewed over immunizations and gave recommendations regarding current immunization needed for age.   In addition to this additional areas were also touched on including: Preventative health exams needed:  Colonoscopy 2018 His brother had colon cancer.  Because of this recommend that he have another colonoscopy in October 2023  Dad had prostate cancer recommend  Patient was advised yearly wellness exam   2. Prediabetes A1c trending upwards 6.3 very important for patient to minimize starches in diet  3. Other hyperlipidemia Cholesterol bump up dose of  Crestor to 10 mg  4. Essential hypertension Blood pressure good control  Check PSA end of September Colonoscopy October Follow-up in 6 months

## 2022-03-08 ENCOUNTER — Encounter: Payer: Self-pay | Admitting: *Deleted

## 2022-03-11 DIAGNOSIS — M545 Low back pain, unspecified: Secondary | ICD-10-CM | POA: Diagnosis not present

## 2022-04-04 ENCOUNTER — Encounter: Payer: Self-pay | Admitting: *Deleted

## 2022-04-04 NOTE — Patient Instructions (Signed)
  Procedure: Colonoscopy Estimated body mass index is 27.37 kg/m as calculated from the following:   Height as of this encounter: '5\' 8"'$  (1.727 m).   Weight as of this encounter: 180 lb (81.6 kg).   Have you had a colonoscopy before?  Yes and last had flex sig 04/03/21, Dr. Gala Romney  Do you have family history of colon cancer  yes  Previous colonoscopy with polyps removed? no  Do you have a history colorectal cancer?   N/a  Are you diabetic?  no  Do you have a prosthetic or mechanical heart valve? no  Do you have a pacemaker/defibrillator?   no  Have you had endocarditis/atrial fibrillation?  no  Do you use supplemental oxygen/CPAP?  no  Have you had joint replacement within the last 12 months?  no  Do you tend to be constipated or have to use laxatives?  yes   Do you have history of alcohol use? If yes, how much and how often.  no  Do you have history or are you using drugs? If yes, what do are you  using?  no  Have you ever had a stroke/heart attack?  N/a  Have you ever had a heart or other vascular stent placed,?n/a  Do you take weight loss medication? No  Do you take any blood-thinning medications such as: (Plavix, aspirin, Coumadin, Aggrenox, Brilinta, Xarelto, Eliquis, Pradaxa, Savaysa or Effient) no  If yes we need the name, milligram, dosage and who is prescribing doctor:  n/a             Current Outpatient Medications  Medication Sig Dispense Refill   amLODipine (NORVASC) 5 MG tablet Take 1 tablet (5 mg total) by mouth daily. 90 tablet 1   Pantoprazole Sodium (PROTONIX PO) Take by mouth.     rosuvastatin (CRESTOR) 10 MG tablet Take 1 tablet (10 mg total) by mouth daily. 90 tablet 1   sertraline (ZOLOFT) 100 MG tablet Take 1 tablet (100 mg total) by mouth daily. 90 tablet 1   valACYclovir (VALTREX) 500 MG tablet Take 1 tablet (500 mg total) by mouth daily. 90 tablet 1   No current facility-administered medications for this visit.    No Known Allergies

## 2022-04-07 NOTE — Progress Notes (Signed)
Recommend OV due to constipation. OK for VV on a Friday. Please schedule August 4th or after.

## 2022-04-09 ENCOUNTER — Encounter: Payer: Self-pay | Admitting: Internal Medicine

## 2022-04-09 NOTE — Progress Notes (Signed)
Please schedule per below.

## 2022-04-26 NOTE — Progress Notes (Signed)
Primary Care Physician:  Kathyrn Drown, MD  Primary GI: Dr. Gala Romney  Patient Location: Home   Provider Location: [facility] office   Reason for Visit: Colon cancer screening   Persons present on the virtual encounter, with roles: Aliene Altes, PA-C (Provider), Steven Gallagher (patient)   Total time (minutes) spent on medical discussion: 12 minutes  Virtual Visit via video note Due to COVID-19, visit is conducted virtually and was requested by patient.   I connected with Randa Evens on 04/27/22 at 10:30 AM EDT by video and verified that I am speaking with the correct person using two identifiers.   I discussed the limitations, risks, security and privacy concerns of performing an evaluation and management service by video and the availability of in person appointments. I also discussed with the patient that there may be a patient responsible charge related to this service. The patient expressed understanding and agreed to proceed.  Chief Complaint  Patient presents with   Colonoscopy    Hemorrhoids hurting sometimes       History of Present Illness: [name] is a 46 year old male with history of symptomatic hemorrhoids s/p banding x 4 in 2019/2020 and banding x 2 in 2022, chronic gas/bloating with history of SIBO treated with a course of Xifaxan in the past, second round in October 2021, history of constipation, presenting today to discuss scheduling screening colonoscopy.    Last colonoscopy 2018 with internal hemorrhoids, otherwise normal exam.  Due to family history of brother with colon cancer, recommended repeating a colonoscopy in 5 years.   Today: Brother with colon cancer at age 7.  Intermittent rectal discomfort and leakage related to hemorrhoids.  Symptoms do not occur on a routine basis and have improved with prior banding's.  Hasn't been using rectal cream, forgot about this. Intermittent low-volume rectal bleeding with straining. Once every couple of weeks.    Bowels move every other day. Soft. Occasionally hard. Taking 2 teaspoons [employer] brand fiber powder daily.  Benefiber previously caused increased gas.  3-4 bottles of water daily.  Supplemental up for 5 minutes.   Linzess 72 mcg caused diarrhea.  Trulance hurt his stomach. Colace hurts his stomach.  History of heartburn that is well controlled on omeprazole 20 mg daily. No dysphagia.    Past Medical History:  Diagnosis Date   Dizziness    Elevated blood pressure    Family history of colon cancer 03/05/2022   Brother had colon cancer at age 22   Family history of prostate cancer in father 03/05/2022   Dad had prostate cancer late 50s   GERD (gastroesophageal reflux disease)    Hyperglycemia    Hyperlipidemia    Hyperlipidemia 12/17/2019   Hypertension      Past Surgical History:  Procedure Laterality Date   BACTERIAL OVERGROWTH TEST N/A 04/22/2015   Procedure: BACTERIAL OVERGROWTH TEST;  Surgeon: Daneil Dolin, MD;  Location: [facility];  Service: Endoscopy;  Laterality: N/A;  0700   COLONOSCOPY N/A 07/03/2017   dr. Gala Romney: small internal hemorrhoids, otherwise normal   ESOPHAGOGASTRODUODENOSCOPY N/A 07/03/2017   Dr. Gala Romney: normal esophagus s/p dilation, small hiatal hernia, normal duodenum   FLEXIBLE SIGMOIDOSCOPY N/A 04/03/2021   mild internal hemorrhoids.   MALONEY DILATION N/A 07/03/2017   Procedure: Venia Minks DILATION;  Surgeon: Daneil Dolin, MD;  Location: [facility];  Service: Endoscopy;  Laterality: N/A;     Current Meds  Medication Sig   amLODipine (NORVASC) 5 MG tablet Take 1 tablet (  5 mg total) by mouth daily.   omeprazole (PRILOSEC) 20 MG capsule Take 20 mg by mouth daily.   rosuvastatin (CRESTOR) 10 MG tablet Take 1 tablet (10 mg total) by mouth daily.   sertraline (ZOLOFT) 100 MG tablet Take 1 tablet (100 mg total) by mouth daily.   valACYclovir (VALTREX) 500 MG tablet Take 1 tablet (500 mg total) by mouth daily.     Family History  Problem Relation  Age of Onset   Hypertension Mother    Hypertension Father    Prostate cancer Father    Hypertension Brother    Colon cancer Brother 45   Colon polyps Neg Hx     Social History   Socioeconomic History   Marital status: Married    Spouse name: Not on file   Number of children: Not on file   Years of education: Not on file   Highest education level: Not on file  Occupational History   Not on file  Tobacco Use   Smoking status: Never   Smokeless tobacco: Never  Vaping Use   Vaping Use: Never used  Substance and Sexual Activity   Alcohol use: No   Drug use: No   Sexual activity: Not on file  Other Topics Concern   Not on file  Social History Narrative   Not on file   Social Determinants of Health   Financial Resource Strain: Not on file  Food Insecurity: Not on file  Transportation Needs: Not on file  Physical Activity: Not on file  Stress: Not on file  Social Connections: Not on file       Review of Systems: Gen: Denies fever, chills, cold or flulike symptoms, presyncope, syncope. CV: Denies chest pain, palpitations. Resp: Denies dyspnea at rest, cough. GI: see HPI  Heme: See HPI  Observations/Objective: No distress. Alert and oriented. Pleasant. Well nourished. Normal mood and affect. Unable to perform complete physical exam due to video encounter.    Assessment:  46 year old male with history of reflux, symptomatic hemorrhoids s/p banding x 4 in 2019/2020 and banding x 2 in 2022, chronic gas/bloating with history of SIBO treated with a course of Xifaxan in the past, second round in October 2021, history of constipation, presenting today to discuss scheduling screening colonoscopy.    Family history of colon cancer: Brother with history of colon cancer at age 72.  Patient's last colonoscopy was in 2018 (hemorrhoids and otherwise normal exam.  He is due for screening at this time.  He does have chronic history of intermittent low-volume rectal bleeding in the  setting of known hemorrhoids as well as mild constipation discussed below.  Hemorrhoids: History of symptomatic hemorrhoids s/p multiple banding sessions with last banding in 2022.  Noted intermittent rectal discomfort and intermittent low-volume rectal bleeding which is often triggered by straining.  Has not been using hydrocortisone rectal cream as he forgot about this.   Constipation: Chronic.  Mild.  Overall improved with [employer] brand fiber daily.  Still with occasional hard stools and straining every couple of weeks.  Previously, Linzess 72 mcg caused diarrhea.  Trulance caused abdominal discomfort.  Colace caused abdominal discomfort.  Benefiber causes increased gas.     Plan: Proceed with colonoscopy with propofol by Dr. Gala Romney in near future. The risks, benefits, and alternatives have been discussed with the patient in detail. The patient states understanding and desires to proceed.  ASA 2 Hydrocortisone rectal cream twice daily x 7 days as needed. Following colonoscopy, can consider repeat hemorrhoid  banding if needed. Limit toilet time to 2-3 minutes. Avoid straining. Try increasing fiber to 2 teaspoons twice daily. 64-96 ounces of water daily. Goal of 30 minutes of exercise daily.     I discussed the assessment and treatment plan with the patient. The patient was provided an opportunity to ask questions and all were answered. The patient agreed with the plan and demonstrated an understanding of the instructions.   The patient was advised to call back or seek an in-person evaluation if the symptoms worsen or if the condition fails to improve as anticipated.  I provided 12 minutes of GI-face-to-face time during this encounter.  Aliene Altes, PA-C [facility]  04/27/2022

## 2022-04-27 ENCOUNTER — Telehealth: Payer: Self-pay | Admitting: Gastroenterology

## 2022-04-27 ENCOUNTER — Encounter: Payer: Self-pay | Admitting: Gastroenterology

## 2022-04-27 ENCOUNTER — Telehealth (INDEPENDENT_AMBULATORY_CARE_PROVIDER_SITE_OTHER): Payer: BC Managed Care – PPO | Admitting: Gastroenterology

## 2022-04-27 VITALS — Ht 68.0 in | Wt 175.0 lb

## 2022-04-27 DIAGNOSIS — Z800 Family history of malignant neoplasm of digestive organs: Secondary | ICD-10-CM | POA: Diagnosis not present

## 2022-04-27 DIAGNOSIS — K5900 Constipation, unspecified: Secondary | ICD-10-CM | POA: Diagnosis not present

## 2022-04-27 DIAGNOSIS — K649 Unspecified hemorrhoids: Secondary | ICD-10-CM

## 2022-04-27 NOTE — Patient Instructions (Signed)
We will arrange for you to have a colonoscopy in the near future with Dr. Gala Romney.  Use hydrocortisone rectal cream twice daily for 7 days as needed for hemorrhoids.   Limit toilet time to 2-3 minutes.  Avoid straining.  To help with bowel regularity: You may try increasing your fiber to 2 teaspoons twice daily. Drink 64 to 96 ounces of water daily. Try to exercise at least 30 minutes a day.   It was nice to meet you today!  Aliene Altes, PA-C [facility]

## 2022-04-27 NOTE — Telephone Encounter (Signed)
Mindy: Please arrange colonoscopy with propofol with Dr. Gala Romney in the near future. ASA 2. DX: Family history of colon cancer

## 2022-04-27 NOTE — Telephone Encounter (Signed)
LMOVM to call back 

## 2022-04-30 ENCOUNTER — Ambulatory Visit (INDEPENDENT_AMBULATORY_CARE_PROVIDER_SITE_OTHER): Payer: BC Managed Care – PPO | Admitting: Dermatology

## 2022-04-30 ENCOUNTER — Encounter: Payer: Self-pay | Admitting: *Deleted

## 2022-04-30 DIAGNOSIS — B078 Other viral warts: Secondary | ICD-10-CM

## 2022-04-30 DIAGNOSIS — D485 Neoplasm of uncertain behavior of skin: Secondary | ICD-10-CM

## 2022-04-30 MED ORDER — PEG 3350-KCL-NA BICARB-NACL 420 G PO SOLR: 4000.0000 mL | Freq: Once | ORAL | 0 refills | Status: AC

## 2022-04-30 NOTE — Telephone Encounter (Signed)
Patient has been scheduled for colonoscopy for 06/04/22 at 9:30 am.  Instructions will be mailed and prep sent to the pharmacy.

## 2022-04-30 NOTE — Patient Instructions (Signed)
Wound Care Instructions  Cleanse wound gently with soap and water once a day then pat dry with clean gauze. Apply a thin coat of Petrolatum (petroleum jelly, "Vaseline") over the wound (unless you have an allergy to this). We recommend that you use a new, sterile tube of Vaseline. Do not pick or remove scabs. Do not remove the yellow or white "healing tissue" from the base of the wound.  Cover the wound with fresh, clean, nonstick gauze and secure with paper tape. You may use Band-Aids in place of gauze and tape if the wound is small enough, but would recommend trimming much of the tape off as there is often too much. Sometimes Band-Aids can irritate the skin.  You should call the office for your biopsy report after 1 week if you have not already been contacted.  If you experience any problems, such as abnormal amounts of bleeding, swelling, significant bruising, significant pain, or evidence of infection, please call the office immediately.  FOR ADULT SURGERY PATIENTS: If you need something for pain relief you may take 1 extra strength Tylenol (acetaminophen) AND 2 Ibuprofen (200mg each) together every 4 hours as needed for pain. (do not take these if you are allergic to them or if you have a reason you should not take them.) Typically, you may only need pain medication for 1 to 3 days.     Due to recent changes in healthcare laws, you may see results of your pathology and/or laboratory studies on MyChart before the doctors have had a chance to review them. We understand that in some cases there may be results that are confusing or concerning to you. Please understand that not all results are received at the same time and often the doctors may need to interpret multiple results in order to provide you with the best plan of care or course of treatment. Therefore, we ask that you please give us 2 business days to thoroughly review all your results before contacting the office for clarification. Should  we see a critical lab result, you will be contacted sooner.   If You Need Anything After Your Visit  If you have any questions or concerns for your doctor, please call our main line at [phone] and press option 4 to reach your doctor's medical assistant. If no one answers, please leave a voicemail as directed and we will return your call as soon as possible. Messages left after 4 pm will be answered the following business day.   You may also send us a message via MyChart. We typically respond to MyChart messages within 1-2 business days.  For prescription refills, please ask your pharmacy to contact our office. Our fax number is [phone].  If you have an urgent issue when the clinic is closed that cannot wait until the next business day, you can page your doctor at the number below.    Please note that while we do our best to be available for urgent issues outside of office hours, we are not available 24/7.   If you have an urgent issue and are unable to reach us, you may choose to seek medical care at your doctor's office, retail clinic, urgent care center, or emergency room.  If you have a medical emergency, please immediately call 911 or go to the emergency department.  Pager Numbers  - Dr. Kowalski: [phone]  - Dr. Moye: [phone]  - Dr. Stewart: [phone]  In the event of inclement weather, please call our main line at   [phone] for an update on the status of any delays or closures.  Dermatology Medication Tips: Please keep the boxes that topical medications come in in order to help keep track of the instructions about where and how to use these. Pharmacies typically print the medication instructions only on the boxes and not directly on the medication tubes.   If your medication is too expensive, please contact our office at [phone] option 4 or send us a message through MyChart.   We are unable to tell what your co-pay for medications will be in  advance as this is different depending on your insurance coverage. However, we may be able to find a substitute medication at lower cost or fill out paperwork to get insurance to cover a needed medication.   If a prior authorization is required to get your medication covered by your insurance company, please allow us 1-2 business days to complete this process.  Drug prices often vary depending on where the prescription is filled and some pharmacies may offer cheaper prices.  The website www.goodrx.com contains coupons for medications through different pharmacies. The prices here do not account for what the cost may be with help from insurance (it may be cheaper with your insurance), but the website can give you the price if you did not use any insurance.  - You can print the associated coupon and take it with your prescription to the pharmacy.  - You may also stop by our office during regular business hours and pick up a GoodRx coupon card.  - If you need your prescription sent electronically to a different pharmacy, notify our office through [facility] MyChart or by phone at [phone] option 4.     Si Usted Necesita Algo Despus de Su Visita  Tambin puede enviarnos un mensaje a travs de MyChart. Por lo general respondemos a los mensajes de MyChart en el transcurso de 1 a 2 das hbiles.  Para renovar recetas, por favor pida a su farmacia que se ponga en contacto con nuestra oficina. Nuestro nmero de fax es el [phone].  Si tiene un asunto urgente cuando la clnica est cerrada y que no puede esperar hasta el siguiente da hbil, puede llamar/localizar a su doctor(a) al nmero que aparece a continuacin.   Por favor, tenga en cuenta que aunque hacemos todo lo posible para estar disponibles para asuntos urgentes fuera del horario de oficina, no estamos disponibles las 24 horas del da, los 7 das de la semana.   Si tiene un problema urgente y no puede comunicarse con nosotros, puede  optar por buscar atencin mdica  en el consultorio de su doctor(a), en una clnica privada, en un centro de atencin urgente o en una sala de emergencias.  Si tiene una emergencia mdica, por favor llame inmediatamente al 911 o vaya a la sala de emergencias.  Nmeros de bper  - Dr. Kowalski: [phone]  - Dra. Moye: [phone]  - Dra. Stewart: [phone]  En caso de inclemencias del tiempo, por favor llame a nuestra lnea principal al [phone] para una actualizacin sobre el estado de cualquier retraso o cierre.  Consejos para la medicacin en dermatologa: Por favor, guarde las cajas en las que vienen los medicamentos de uso tpico para ayudarle a seguir las instrucciones sobre dnde y cmo usarlos. Las farmacias generalmente imprimen las instrucciones del medicamento slo en las cajas y no directamente en los tubos del medicamento.   Si su medicamento es muy caro, por favor, pngase en contacto con   nuestra oficina llamando al [phone] y presione la opcin 4 o envenos un mensaje a travs de MyChart.   No podemos decirle cul ser su copago por los medicamentos por adelantado ya que esto es diferente dependiendo de la cobertura de su seguro. Sin embargo, es posible que podamos encontrar un medicamento sustituto a menor costo o llenar un formulario para que el seguro cubra el medicamento que se considera necesario.   Si se requiere una autorizacin previa para que su compaa de seguros cubra su medicamento, por favor permtanos de 1 a 2 das hbiles para completar este proceso.  Los precios de los medicamentos varan con frecuencia dependiendo del lugar de dnde se surte la receta y alguna farmacias pueden ofrecer precios ms baratos.  El sitio web www.goodrx.com tiene cupones para medicamentos de diferentes farmacias. Los precios aqu no tienen en cuenta lo que podra costar con la ayuda del seguro (puede ser ms barato con su seguro), pero el sitio web puede darle el  precio si no utiliz ningn seguro.  - Puede imprimir el cupn correspondiente y llevarlo con su receta a la farmacia.  - Tambin puede pasar por nuestra oficina durante el horario de atencin regular y recoger una tarjeta de cupones de GoodRx.  - Si necesita que su receta se enve electrnicamente a una farmacia diferente, informe a nuestra oficina a travs de MyChart de  o por telfono llamando al [phone] y presione la opcin 4.  

## 2022-04-30 NOTE — Progress Notes (Unsigned)
   New Patient Visit  Subjective  Steven Gallagher is a 46 y.o. male who presents for the following: Other (New patient - Mole of left ankle x ~2 years).   The following portions of the chart were reviewed this encounter and updated as appropriate:   Tobacco  Allergies  Meds  Problems  Med Hx  Surg Hx  Fam Hx      Review of Systems:  No other skin or systemic complaints except as noted in HPI or Assessment and Plan.  Objective  Well appearing patient in no apparent distress; mood and affect are within normal limits.  A focused examination was performed including left leg. Relevant physical exam findings are noted in the Assessment and Plan.  Left lat ankle 0.6 cm brown papule   Assessment & Plan  Neoplasm of uncertain behavior of skin Left lat ankle  Epidermal / dermal shaving  Lesion diameter (cm):  0.6 Informed consent: discussed and consent obtained   Timeout: patient name, date of birth, surgical site, and procedure verified   Procedure prep:  Patient was prepped and draped in usual sterile fashion Prep type:  Isopropyl alcohol Anesthesia: the lesion was anesthetized in a standard fashion   Anesthetic:  1% lidocaine w/ epinephrine 1-100,000 buffered w/ 8.4% NaHCO3 Instrument used: flexible razor blade   Hemostasis achieved with: pressure, aluminum chloride and electrodesiccation   Outcome: patient tolerated procedure well   Post-procedure details: sterile dressing applied and wound care instructions given   Dressing type: bandage and petrolatum    Specimen 1 - Surgical pathology Differential Diagnosis: SK vs Viral wart vs other  Check Margins: No   Return if symptoms worsen or fail to improve.  I, Ashok Cordia, CMA, am acting as scribe for Sarina Ser, MD . Documentation: I have reviewed the above documentation for accuracy and completeness, and I agree with the above.  Sarina Ser, MD

## 2022-05-01 ENCOUNTER — Encounter: Payer: Self-pay | Admitting: Dermatology

## 2022-05-01 ENCOUNTER — Telehealth: Payer: Self-pay | Admitting: Family Medicine

## 2022-05-01 NOTE — Telephone Encounter (Signed)
So noted thank you 

## 2022-05-01 NOTE — Telephone Encounter (Signed)
Patient is a referral sent over to Dr. Karolee Ohs office for his colonoscopy he setup for 9/18 so insurance will cover.

## 2022-05-02 ENCOUNTER — Telehealth: Payer: Self-pay

## 2022-05-02 NOTE — Telephone Encounter (Signed)
Patient returned Steven Gallagher's call and I spoke with him and informed him of his bx results AF

## 2022-05-02 NOTE — Telephone Encounter (Signed)
-----   Message from Ralene Bathe, MD sent at 05/02/2022 11:45 AM EDT ----- Diagnosis Skin , left lat ankle VERRUCA VULGARIS, INFLAMED  Benign inflamed viral wart No further treatment needed May recur - if recurs, return for additional treatment

## 2022-05-02 NOTE — Telephone Encounter (Signed)
Left voicemail to return my call

## 2022-05-31 ENCOUNTER — Ambulatory Visit: Payer: BC Managed Care – PPO | Admitting: Gastroenterology

## 2022-06-04 ENCOUNTER — Other Ambulatory Visit: Payer: Self-pay

## 2022-06-04 ENCOUNTER — Encounter (HOSPITAL_COMMUNITY): Payer: Self-pay | Admitting: Internal Medicine

## 2022-06-04 ENCOUNTER — Encounter (HOSPITAL_COMMUNITY)
Admission: RE | Disposition: A | Discharge status: Home / Self Care | Payer: Self-pay | Source: Home / Self Care | Attending: Internal Medicine

## 2022-06-04 ENCOUNTER — Ambulatory Visit (HOSPITAL_COMMUNITY): Payer: BC Managed Care – PPO | Admitting: Anesthesiology

## 2022-06-04 ENCOUNTER — Ambulatory Visit (HOSPITAL_COMMUNITY)
Admission: RE | Admit: 2022-06-04 | Discharge: 2022-06-04 | Disposition: A | Discharge status: Home / Self Care | Payer: BC Managed Care – PPO | Attending: Internal Medicine | Admitting: Internal Medicine

## 2022-06-04 DIAGNOSIS — F419 Anxiety disorder, unspecified: Secondary | ICD-10-CM | POA: Diagnosis not present

## 2022-06-04 DIAGNOSIS — K219 Gastro-esophageal reflux disease without esophagitis: Secondary | ICD-10-CM | POA: Insufficient documentation

## 2022-06-04 DIAGNOSIS — I10 Essential (primary) hypertension: Secondary | ICD-10-CM | POA: Insufficient documentation

## 2022-06-04 DIAGNOSIS — Z1211 Encounter for screening for malignant neoplasm of colon: Secondary | ICD-10-CM | POA: Insufficient documentation

## 2022-06-04 DIAGNOSIS — D125 Benign neoplasm of sigmoid colon: Secondary | ICD-10-CM | POA: Diagnosis not present

## 2022-06-04 DIAGNOSIS — Z1212 Encounter for screening for malignant neoplasm of rectum: Secondary | ICD-10-CM | POA: Diagnosis not present

## 2022-06-04 DIAGNOSIS — Z800 Family history of malignant neoplasm of digestive organs: Secondary | ICD-10-CM | POA: Diagnosis not present

## 2022-06-04 DIAGNOSIS — K640 First degree hemorrhoids: Secondary | ICD-10-CM | POA: Diagnosis not present

## 2022-06-04 DIAGNOSIS — K635 Polyp of colon: Secondary | ICD-10-CM | POA: Diagnosis not present

## 2022-06-04 DIAGNOSIS — Q438 Other specified congenital malformations of intestine: Secondary | ICD-10-CM | POA: Diagnosis not present

## 2022-06-04 HISTORY — PX: POLYPECTOMY: SHX5525

## 2022-06-04 HISTORY — PX: COLONOSCOPY WITH PROPOFOL: SHX5780

## 2022-06-04 SURGERY — COLONOSCOPY WITH PROPOFOL
Anesthesia: General

## 2022-06-04 MED ORDER — LACTATED RINGERS IV SOLN: INTRAVENOUS | Status: DC

## 2022-06-04 MED ORDER — LACTATED RINGERS IV SOLN: INTRAVENOUS | Status: DC | PRN

## 2022-06-04 MED ORDER — PROPOFOL 500 MG/50ML IV EMUL
INTRAVENOUS | Status: DC | PRN
  Administered 2022-06-04: 150 ug/kg/min via INTRAVENOUS

## 2022-06-04 MED ORDER — LIDOCAINE HCL (CARDIAC) PF 100 MG/5ML IV SOSY
PREFILLED_SYRINGE | INTRAVENOUS | Status: DC | PRN
  Administered 2022-06-04: 40 mg via INTRAVENOUS

## 2022-06-04 MED ORDER — PROPOFOL 10 MG/ML IV BOLUS
INTRAVENOUS | Status: DC | PRN
  Administered 2022-06-04: 100 mg via INTRAVENOUS

## 2022-06-04 NOTE — Discharge Instructions (Addendum)
  Colonoscopy Discharge Instructions  Read the instructions outlined below and refer to this sheet in the next few weeks. These discharge instructions provide you with general information on caring for yourself after you leave the hospital. Your doctor may also give you specific instructions. While your treatment has been planned according to the most current medical practices available, unavoidable complications occasionally occur. If you have any problems or questions after discharge, call Dr. Gala Romney at [phone]. ACTIVITY You may resume your regular activity, but move at a slower pace for the next 24 hours.  Take frequent rest periods for the next 24 hours.  Walking will help get rid of the air and reduce the bloated feeling in your belly (abdomen).  No driving for 24 hours (because of the medicine (anesthesia) used during the test).   Do not sign any important legal documents or operate any machinery for 24 hours (because of the anesthesia used during the test).  NUTRITION Drink plenty of fluids.  You may resume your normal diet as instructed by your doctor.  Begin with a light meal and progress to your normal diet. Heavy or fried foods are harder to digest and may make you feel sick to your stomach (nauseated).  Avoid alcoholic beverages for 24 hours or as instructed.  MEDICATIONS You may resume your normal medications unless your doctor tells you otherwise.  WHAT YOU CAN EXPECT TODAY Some feelings of bloating in the abdomen.  Passage of more gas than usual.  Spotting of blood in your stool or on the toilet paper.  IF YOU HAD POLYPS REMOVED DURING THE COLONOSCOPY: No aspirin products for 7 days or as instructed.  No alcohol for 7 days or as instructed.  Eat a soft diet for the next 24 hours.  FINDING OUT THE RESULTS OF YOUR TEST Not all test results are available during your visit. If your test results are not back during the visit, make an appointment with your caregiver to find out the  results. Do not assume everything is normal if you have not heard from your caregiver or the medical facility. It is important for you to follow up on all of your test results.  SEEK IMMEDIATE MEDICAL ATTENTION IF: You have more than a spotting of blood in your stool.  Your belly is swollen (abdominal distention).  You are nauseated or vomiting.  You have a temperature over 101.  You have abdominal pain or discomfort that is severe or gets worse throughout the day.       1 polyp removed from your colon today    You have minimal hemorrhoids.      Limit toilet time and straining as discussed   office visit with Roseanne Kaufman in 3 months   further recommendations to follow pending review of pathology report   at patient request, I called Kandis Mannan at [phone] findings and recommendations   OFFICE WILL CALL WITH APPOINTMENT DETAILS - PLEASE FOLLOW UP IF YOU DO NOT HEAR FROM THEM    DR. ROURK'S OFFICE  [phone]

## 2022-06-04 NOTE — Op Note (Signed)
[facility] Patient Name: Steven Gallagher Procedure Date: 06/04/2022 8:58 AM MRN: [id number] Date of Birth: [birth year] Attending MD: Norvel Richards , MD CSN: [id number] Age: 46 Admit Type: Outpatient Procedure:                Colonoscopy Indications:              Screening in patient at increased risk: Family                            history of 1st-degree relative with colorectal                            cancer before age 28 years Providers:                Norvel Richards, MD, Lambert Mody, Ladoris Gene Technician, Technician, Aram Candela Referring MD:              Medicines:                Propofol per Anesthesia Complications:            No immediate complications. Estimated Blood Loss:     Estimated blood loss: none. Procedure:                Pre-Anesthesia Assessment:                           - Prior to the procedure, a History and Physical                            was performed, and patient medications and                            allergies were reviewed. The patient's tolerance of                            previous anesthesia was also reviewed. The risks                            and benefits of the procedure and the sedation                            options and risks were discussed with the patient.                            All questions were answered, and informed consent                            was obtained. Prior Anticoagulants: The patient has                            taken no previous anticoagulant or antiplatelet  agents. ASA Grade Assessment: II - A patient with                            mild systemic disease. After reviewing the risks                            and benefits, the patient was deemed in                            satisfactory condition to undergo the procedure.                           After obtaining informed consent, the colonoscope                             was passed under direct vision. Throughout the                            procedure, the patient's blood pressure, pulse, and                            oxygen saturations were monitored continuously. The                            [id number]) scope was introduced through the                            anus and advanced to the the cecum, identified by                            appendiceal orifice and ileocecal valve. The                            colonoscopy was performed without difficulty. The                            patient tolerated the procedure well. The quality                            of the bowel preparation was adequate. Scope In: 9:27:14 AM Scope Out: 9:45:03 AM Scope Withdrawal Time: 0 hours 9 minutes 45 seconds  Total Procedure Duration: 0 hours 17 minutes 49 seconds  Findings:      The perianal and digital rectal examinations were normal. Redundant and       elongated colon requiring changing of the patient's position and       external abdominal pressure to reach the cecum.      A 8 mm polyp was found in the sigmoid colon. The polyp was       semi-pedunculated. The polyp was removed with a cold snare. Resection       and retrieval were complete. Estimated blood loss was minimal.      Non-bleeding internal hemorrhoids were found during retroflexion. The       hemorrhoids were mild, small and Grade I (internal hemorrhoids that do  not prolapse). Scars in appropriate positions indicating prior       successful hemorrhoid banding. Impression:               - One 8 mm polyp in the sigmoid colon, removed with                            a cold snare. Resected and retrieved.                           - Non-bleeding internal hemorrhoids. Redundant colon Moderate Sedation:      Moderate (conscious) sedation was personally administered by an       anesthesia professional. The following parameters were monitored: oxygen       saturation, heart rate, blood pressure,  and response to care. Recommendation:           - Repeat colonoscopy date to be determined after                            pending pathology results are reviewed for                            surveillance.                           - Return to GI office in 3 months. OV 3 months to                            follow-up on hemorrhoidal symptoms. Procedure Code(s):        --- Professional ---                           [phone], Colonoscopy, flexible; with removal of                            tumor(s), polyp(s), or other lesion(s) by snare                            technique Diagnosis Code(s):        --- Professional ---                           Z80.0, Family history of malignant neoplasm of                            digestive organs                           K63.5, Polyp of colon                           K64.0, First degree hemorrhoids CPT copyright 2019 [employer]. All rights reserved. The codes documented in this report are preliminary and upon coder review may  be revised to meet current compliance requirements. Cristopher Estimable. Rechy Bost, MD Norvel Richards, MD 06/04/2022 9:54:17 AM This report has been signed electronically. Number of Addenda: 0

## 2022-06-04 NOTE — Transfer of Care (Signed)
Immediate Anesthesia Transfer of Care Note  Patient: Steven Gallagher  Procedure(s) Performed: COLONOSCOPY WITH PROPOFOL POLYPECTOMY  Patient Location: Short Stay  Anesthesia Type:General  Level of Consciousness: sedated  Airway & Oxygen Therapy: Patient Spontanous Breathing  Post-op Assessment: Report given to RN and Post -op Vital signs reviewed and stable  Post vital signs: Reviewed and stable  Last Vitals:  Vitals Value Taken Time  BP 100/65   Temp 36   Pulse 72   Resp 16   SpO2 96     Last Pain:  Vitals:   06/04/22 0925  TempSrc:   PainSc: 0-No pain         Complications: No notable events documented.

## 2022-06-04 NOTE — Anesthesia Postprocedure Evaluation (Signed)
Anesthesia Post Note  Patient: Steven Gallagher  Procedure(s) Performed: COLONOSCOPY WITH PROPOFOL POLYPECTOMY  Patient location during evaluation: Phase II Anesthesia Type: General Level of consciousness: awake Pain management: pain level controlled Vital Signs Assessment: post-procedure vital signs reviewed and stable Respiratory status: spontaneous breathing and respiratory function stable Cardiovascular status: blood pressure returned to baseline and stable Postop Assessment: no headache and no apparent nausea or vomiting Anesthetic complications: no Comments: Late entry   No notable events documented.   Last Vitals:  Vitals:   06/04/22 0817 06/04/22 0948  BP: 135/89 98/64  Pulse: 82 72  Resp: 14 17  Temp: 37 C (!) 36.3 C  SpO2: 100% 100%    Last Pain:  Vitals:   06/04/22 0948  TempSrc: Oral  PainSc: 0-No pain                 Louann Sjogren

## 2022-06-04 NOTE — H&P (Signed)
$'@LOGO'f$ @   Primary Care Physician:  Kathyrn Drown, MD Primary Gastroenterologist:  Dr. Gala Romney  Pre-Procedure History & Physical: HPI:  Steven Gallagher is a 46 y.o. male here for    high risk screening colonoscopy -    Brother with colon cancer at age 44.  Negative colonoscopy 2018.  Constipation managed well with Linzess.  Past Medical History:  Diagnosis Date   Dizziness    Elevated blood pressure    Family history of colon cancer 03/05/2022   Brother had colon cancer at age 10   Family history of prostate cancer in father 03/05/2022   Dad had prostate cancer late 50s   GERD (gastroesophageal reflux disease)    Hyperglycemia    Hyperlipidemia    Hyperlipidemia 12/17/2019   Hypertension     Past Surgical History:  Procedure Laterality Date   BACTERIAL OVERGROWTH TEST N/A 04/22/2015   Procedure: BACTERIAL OVERGROWTH TEST;  Surgeon: Daneil Dolin, MD;  [facility]: [facility];  Service: Endoscopy;  Laterality: N/A;  0700   COLONOSCOPY N/A 07/03/2017   dr. Gala Romney: small internal hemorrhoids, otherwise normal   ESOPHAGOGASTRODUODENOSCOPY N/A 07/03/2017   Dr. Gala Romney: normal esophagus s/p dilation, small hiatal hernia, normal duodenum   FLEXIBLE SIGMOIDOSCOPY N/A 04/03/2021   mild internal hemorrhoids.   MALONEY DILATION N/A 07/03/2017   Procedure: Venia Minks DILATION;  Surgeon: Daneil Dolin, MD;  Location: [facility];  Service: Endoscopy;  Laterality: N/A;    Prior to Admission medications   Medication Sig Start Date End Date Taking? Authorizing Provider  amLODipine (NORVASC) 5 MG tablet Take 1 tablet (5 mg total) by mouth daily. 03/05/22  Yes Luking, Elayne Snare, MD  Cinnamon 500 MG capsule Take 500 mg by mouth daily.   Yes [provider]  omeprazole (PRILOSEC) 20 MG capsule Take 20 mg by mouth daily. 04/16/22  Yes [provider]  rosuvastatin (CRESTOR) 10 MG tablet Take 1 tablet (10 mg total) by mouth daily. 03/05/22  Yes Kathyrn Drown, MD  sertraline (ZOLOFT) 100  MG tablet Take 1 tablet (100 mg total) by mouth daily. 03/05/22  Yes Luking, Elayne Snare, MD  valACYclovir (VALTREX) 500 MG tablet Take 1 tablet (500 mg total) by mouth daily. 03/05/22  Yes Kathyrn Drown, MD    Allergies as of 04/30/2022   (No Known Allergies)    Family History  Problem Relation Age of Onset   Hypertension Mother    Hypertension Father    Prostate cancer Father    Hypertension Brother    Colon cancer Brother 63   Colon polyps Neg Hx     Social History   Socioeconomic History   Marital status: Married    Spouse name: Not on file   Number of children: Not on file   Years of education: Not on file   Highest education level: Not on file  Occupational History   Not on file  Tobacco Use   Smoking status: Never   Smokeless tobacco: Never  Vaping Use   Vaping Use: Never used  Substance and Sexual Activity   Alcohol use: No   Drug use: No   Sexual activity: Not on file  Other Topics Concern   Not on file  Social History Narrative   Not on file   Social Determinants of Health   Financial Resource Strain: Not on file  Food Insecurity: Not on file  Transportation Needs: Not on file  Physical Activity: Not on file  Stress: Not on  file  Social Connections: Not on file  Intimate Partner Violence: Not on file    Review of Systems: See HPI, otherwise negative ROS  Physical Exam: BP 135/89   Pulse 82   Temp 98.6 F (37 C) (Oral)   Resp 14   Ht '5\' 8"'$  (1.727 m)   Wt 81.6 kg   SpO2 100%   BMI 27.37 kg/m  General:   Alert,  Well-developed, well-nourished, pleasant and cooperative in NAD Mouth:  No deformity or lesions. Neck:  Supple; no masses or thyromegaly. No significant cervical adenopathy. Lungs:  Clear throughout to auscultation.   No wheezes, crackles, or rhonchi. No acute distress. Heart:  Regular rate and rhythm; no murmurs, clicks, rubs,  or gallops. Abdomen: Non-distended, normal bowel sounds.  Soft and nontender without appreciable mass or  hepatosplenomegaly.  Pulses:  Normal pulses noted. Extremities:  Without clubbing or edema.  Impression:   46 year old gentleman here for high risk screening colonoscopy.  History of colon cancer in a first-degree relative at a young age.  I have offered the patient a high risk screening colonoscopy today per plan. \The risks, benefits, limitations, alternatives and imponderables have been reviewed with the patient. Questions have been answered. All parties are agreeable.      Notice: This dictation was prepared with Dragon dictation along with smaller phrase technology. Any transcriptional errors that result from this process are unintentional and may not be corrected upon review.

## 2022-06-04 NOTE — Anesthesia Preprocedure Evaluation (Signed)
Anesthesia Evaluation  Patient identified by MRN, date of birth, ID band Patient awake    Reviewed: Allergy & Precautions, H&P , NPO status , Patient's Chart, lab work & pertinent test results, reviewed documented beta blocker date and time   Airway Mallampati: II  TM Distance: >3 FB Neck ROM: full    Dental no notable dental hx.    Pulmonary neg pulmonary ROS,    Pulmonary exam normal breath sounds clear to auscultation       Cardiovascular Exercise Tolerance: Good hypertension, negative cardio ROS   Rhythm:regular Rate:Normal     Neuro/Psych PSYCHIATRIC DISORDERS Anxiety negative neurological ROS     GI/Hepatic Neg liver ROS, GERD  Medicated,  Endo/Other  negative endocrine ROS  Renal/GU negative Renal ROS  negative genitourinary   Musculoskeletal   Abdominal   Peds  Hematology negative hematology ROS (+)   Anesthesia Other Findings   Reproductive/Obstetrics negative OB ROS                             Anesthesia Physical Anesthesia Plan  ASA: 2  Anesthesia Plan: General   Post-op Pain Management:    Induction:   PONV Risk Score and Plan: Propofol infusion  Airway Management Planned:   Additional Equipment:   Intra-op Plan:   Post-operative Plan:   Informed Consent: I have reviewed the patients History and Physical, chart, labs and discussed the procedure including the risks, benefits and alternatives for the proposed anesthesia with the patient or authorized representative who has indicated his/her understanding and acceptance.     Dental Advisory Given  Plan Discussed with: CRNA  Anesthesia Plan Comments:         Anesthesia Quick Evaluation

## 2022-06-05 ENCOUNTER — Encounter: Payer: Self-pay | Admitting: Internal Medicine

## 2022-06-05 LAB — SURGICAL PATHOLOGY

## 2022-06-11 ENCOUNTER — Encounter (HOSPITAL_COMMUNITY): Payer: Self-pay | Admitting: Internal Medicine

## 2022-06-28 ENCOUNTER — Other Ambulatory Visit: Payer: Self-pay | Admitting: Family Medicine

## 2022-07-10 ENCOUNTER — Ambulatory Visit: Payer: BC Managed Care – PPO | Admitting: Gastroenterology

## 2022-07-17 ENCOUNTER — Ambulatory Visit (INDEPENDENT_AMBULATORY_CARE_PROVIDER_SITE_OTHER): Payer: BC Managed Care – PPO | Admitting: Gastroenterology

## 2022-07-17 ENCOUNTER — Encounter: Payer: Self-pay | Admitting: Gastroenterology

## 2022-07-17 VITALS — BP 130/84 | HR 81 | Temp 98.1°F | Ht 68.0 in | Wt 181.4 lb

## 2022-07-17 DIAGNOSIS — K5900 Constipation, unspecified: Secondary | ICD-10-CM | POA: Diagnosis not present

## 2022-07-17 MED ORDER — LUBIPROSTONE 8 MCG PO CAPS: 8.0000 ug | ORAL_CAPSULE | Freq: Two times a day (BID) | ORAL | 3 refills | Status: DC

## 2022-07-17 NOTE — Patient Instructions (Signed)
Continue Metamucil as you are doing.  Let's try Amitiza 1 gelcap WITH FOOD ( to avoid nausea) daily. You can increase this to twice a day if needed  We will see you in 6 months! Please call with any concerns or message in the meantime if needed!  I enjoyed seeing you again today! As you know, I value our relationship and want to provide genuine, compassionate, and quality care. I welcome your feedback. If you receive a survey regarding your visit,  I greatly appreciate you taking time to fill this out. See you next time!  [name], PhD, ANP-BC [facility]

## 2022-07-17 NOTE — Progress Notes (Signed)
Gastroenterology Office Note     Primary Care Physician:  Kathyrn Drown, MD  Primary Gastroenterologist: Dr. Gala Romney    Chief Complaint   Chief Complaint  Patient presents with   Follow-up    Follow up on hemorrhoids and constipation. Constipation a little better not full BM's     History of Present Illness   Steven Gallagher is a 46 y.o. male presenting today in follow-up with a history of symptomatic hemorrhoids s/p banding x 4 in 2019/2020 and banding x 2 in 2022, chronic gas/bloating with history of SIBO treated with a course of Xifaxan in the past, second round in October 2021, history of constipation, adenomas with surveillance due in 2028, presenting today due to constipation.   Historically, Linzess 72 mcg caused diarrhea. States trulance hurt his stomach, Colace hurt stomach. Benefiber caused increased gas. Hasn't tried Miralax yet. Needs to drink more water.   Metamucil has helped but still not completely productive. Has abdominal discomfort and bloating.    Past Medical History:  Diagnosis Date   Dizziness    Elevated blood pressure    Family history of colon cancer 03/05/2022   Brother had colon cancer at age 26   Family history of prostate cancer in father 03/05/2022   Dad had prostate cancer late 50s   GERD (gastroesophageal reflux disease)    Hyperglycemia    Hyperlipidemia    Hyperlipidemia 12/17/2019   Hypertension     Past Surgical History:  Procedure Laterality Date   BACTERIAL OVERGROWTH TEST N/A 04/22/2015   Procedure: BACTERIAL OVERGROWTH TEST;  Surgeon: Daneil Dolin, MD;  [facility]: [facility];  Service: Endoscopy;  Laterality: N/A;  0700   COLONOSCOPY N/A 07/03/2017   dr. Gala Romney: small internal hemorrhoids, otherwise normal   COLONOSCOPY WITH PROPOFOL N/A 06/04/2022   one 8 mm polyp in sigmoid, non-bleeding internal hemorrhoids. Tubular adenoma   ESOPHAGOGASTRODUODENOSCOPY N/A 07/03/2017   Dr. Gala Romney: normal esophagus s/p dilation, small  hiatal hernia, normal duodenum   FLEXIBLE SIGMOIDOSCOPY N/A 04/03/2021   mild internal hemorrhoids.   MALONEY DILATION N/A 07/03/2017   Procedure: Venia Minks DILATION;  Surgeon: Daneil Dolin, MD;  [facility];  Service: Endoscopy;  Laterality: N/A;   POLYPECTOMY  06/04/2022   Procedure: POLYPECTOMY;  Surgeon: Daneil Dolin, MD;  [facility];  Service: Endoscopy;;    Current Outpatient Medications  Medication Sig Dispense Refill   amLODipine (NORVASC) 5 MG tablet Take 1 tablet (5 mg total) by mouth daily. 90 tablet 1   Cinnamon 500 MG capsule Take 500 mg by mouth daily.     omeprazole (PRILOSEC) 20 MG capsule Take 20 mg by mouth daily.     rosuvastatin (CRESTOR) 10 MG tablet TAKE 1 TABLET BY MOUTH EVERY DAY 90 tablet 0   sertraline (ZOLOFT) 100 MG tablet Take 1 tablet (100 mg total) by mouth daily. 90 tablet 1   valACYclovir (VALTREX) 500 MG tablet Take 1 tablet (500 mg total) by mouth daily. 90 tablet 1   No current facility-administered medications for this visit.    Allergies as of 07/17/2022   (No Known Allergies)    Family History  Problem Relation Age of Onset   Hypertension Mother    Hypertension Father    Prostate cancer Father    Hypertension Brother    Colon cancer Brother 83   Colon polyps Neg Hx     Social History   Socioeconomic History   Marital status:  Married    Spouse name: Not on file   Number of children: Not on file   Years of education: Not on file   Highest education level: Not on file  Occupational History   Not on file  Tobacco Use   Smoking status: Never   Smokeless tobacco: Never  Vaping Use   Vaping Use: Never used  Substance and Sexual Activity   Alcohol use: No   Drug use: No   Sexual activity: Not on file  Other Topics Concern   Not on file  Social History Narrative   Not on file   Social Determinants of Health   Financial Resource Strain: Not on file  Food Insecurity: Not on file  Transportation  Needs: Not on file  Physical Activity: Not on file  Stress: Not on file  Social Connections: Not on file  Intimate Partner Violence: Not on file     Review of Systems   Gen: Denies any fever, chills, fatigue, weight loss, lack of appetite.  CV: Denies chest pain, heart palpitations, peripheral edema, syncope.  Resp: Denies shortness of breath at rest or with exertion. Denies wheezing or cough.  GI: Denies dysphagia or odynophagia. Denies jaundice, hematemesis, fecal incontinence. GU : Denies urinary burning, urinary frequency, urinary hesitancy MS: Denies joint pain, muscle weakness, cramps, or limitation of movement.  Derm: Denies rash, itching, dry skin Psych: Denies depression, anxiety, memory loss, and confusion Heme: Denies bruising, bleeding, and enlarged lymph nodes.   Physical Exam   BP 130/84   Pulse 81   Temp 98.1 F (36.7 C)   Ht '5\' 8"'$  (1.727 m)   Wt 181 lb 6.4 oz (82.3 kg)   BMI 27.58 kg/m  General:   Alert and oriented. Pleasant and cooperative. Well-nourished and well-developed.  Head:  Normocephalic and atraumatic. Eyes:  Without icterus Abdomen:  +BS, soft, non-tender and non-distended. No HSM noted. No guarding or rebound. No masses appreciated.  Rectal:  Deferred  Msk:  Symmetrical without gross deformities. Normal posture. Extremities:  Without edema. Neurologic:  Alert and  oriented x4;  grossly normal neurologically. Skin:  Intact without significant lesions or rashes. Psych:  Alert and cooperative. Normal mood and affect.   Assessment   Steven Gallagher is a 46 y.o. male presenting today in follow-up with a history of symptomatic hemorrhoids s/p banding x 4 in 2019/2020 and banding x 2 in 2022, chronic gas/bloating with history of SIBO treated with a course of Xifaxan in the past, second round in October 2021, history of constipation, adenomas with surveillance due in 2028, presenting today due to constipation.   Constipation: improved with Metamucil  but still not ideal. As noted above, he has tried Linzess, Trulance, and stool softeners. Will trial Amitiza 8 mcg daily to BID.     PLAN   Amitiza 8 mcg po daily to BID sent to pharmacy (we discussed indication for BID, but he can start with daily first to see how this works for him) Return in 6 months Next colonoscopy 2028    [name], PhD, ANP-BC [facility]

## 2022-07-28 ENCOUNTER — Encounter: Payer: Self-pay | Admitting: Family Medicine

## 2022-07-28 DIAGNOSIS — R7303 Prediabetes: Secondary | ICD-10-CM

## 2022-07-28 DIAGNOSIS — Z125 Encounter for screening for malignant neoplasm of prostate: Secondary | ICD-10-CM

## 2022-07-30 DIAGNOSIS — R7303 Prediabetes: Secondary | ICD-10-CM | POA: Diagnosis not present

## 2022-07-30 DIAGNOSIS — Z125 Encounter for screening for malignant neoplasm of prostate: Secondary | ICD-10-CM | POA: Diagnosis not present

## 2022-07-30 NOTE — Telephone Encounter (Signed)
Nurses-please order PSA.  Also order A1c  PSAs for screening purposes, A1c is because of prediabetes thank you

## 2022-07-31 ENCOUNTER — Encounter: Payer: Self-pay | Admitting: Family Medicine

## 2022-07-31 LAB — HEMOGLOBIN A1C
Est. average glucose Bld gHb Est-mCnc: 134 mg/dL
Hgb A1c MFr Bld: 6.3 % — ABNORMAL HIGH (ref 4.8–5.6)

## 2022-07-31 LAB — PSA: Prostate Specific Ag, Serum: 1.2 ng/mL (ref 0.0–4.0)

## 2022-07-31 NOTE — Progress Notes (Signed)
Please mail to the patient 

## 2022-08-05 ENCOUNTER — Telehealth: Payer: BC Managed Care – PPO | Admitting: Family

## 2022-08-05 DIAGNOSIS — B353 Tinea pedis: Secondary | ICD-10-CM

## 2022-08-05 MED ORDER — CLOTRIMAZOLE 1 % EX CREA: 1.0000 | TOPICAL_CREAM | Freq: Two times a day (BID) | CUTANEOUS | 0 refills | Status: AC

## 2022-08-05 NOTE — Progress Notes (Signed)
E-Visit for Athlete's Foot  We are sorry that you are not feeling well. Here is how we plan to help!  Based on what you shared with me it looks like you have tinea pedis, or "Athlete's Foot".  This type of rash can spread through shared towels, clothing, bedding, etc., as well as hard surfaces (particularly in moist areas) such as shower stalls, locker room floors, pool areas, etc. The symptoms of Athlete's Foot include red, swollen, peeling, itchy skin between the toes (especially between the pinky toe and the one next to it). The sole and heel of the foot may also be affected. In severe cases, the skin on the feet can blister.  Athlete's foot can usually be treated with over-the-counter topical antifungal products; but sometimes with chronic or extensive tinea pedis, prescription oral medications are needed.   I am recommending:Clotrimazole 1% cream or gel, apply to area twice per day    HOME CARE:  Keep feet clean, dry, and cool. Avoid using swimming pools, public showers, or foot baths. Wear sandals when possible or air shoes out by alternating them every 2-3 days. Avoid wearing closed shoes and wearing socks made from fabric that doesn't dry easily (for example, nylon). Treat the infection with recommended medication  GET HELP RIGHT AWAY IF:  Symptoms that don't go away after treatment. Severe itching that persists. If your rash spreads or swells. If your rash begins to have drainage or smell. You develop a fever.  MAKE SURE YOU   Understand these instructions. Will watch your condition. Will get help right away if you are not doing well or get worse.   Thank you for choosing an e-visit.  Your e-visit answers were reviewed by a board certified advanced clinical practitioner to complete your personal care plan. Depending upon the condition, your plan could have included both over the counter or prescription medications.  Please review your pharmacy choice. Make  sure the pharmacy is open so you can pick up prescription now. If there is a problem, you may contact your provider through [employer] and have the prescription routed to another pharmacy.  Your safety is important to [place]. If you have drug allergies check your prescription carefully.   For the next 24 hours you can use MyChart to ask questions about today's visit, request a non-urgent call back, or ask for a work or school excuse.  You will get an email in the next two days asking about your experience. I hope that your e-visit has been valuable and will speed your recovery  References or for more information:  [email or web address]=[email or web address]&source=search_result&selectedTitle=1~104&usage_type=default&display_rank=1  [email or web address]    Approximately 5 minutes was spent documenting and reviewing patient's chart.

## 2022-08-23 ENCOUNTER — Ambulatory Visit (INDEPENDENT_AMBULATORY_CARE_PROVIDER_SITE_OTHER): Payer: BC Managed Care – PPO | Admitting: Family Medicine

## 2022-08-23 VITALS — BP 122/74 | HR 86 | Temp 98.2°F | Ht 68.0 in | Wt 179.0 lb

## 2022-08-23 DIAGNOSIS — I10 Essential (primary) hypertension: Secondary | ICD-10-CM

## 2022-08-23 DIAGNOSIS — E7849 Other hyperlipidemia: Secondary | ICD-10-CM

## 2022-08-23 DIAGNOSIS — Z23 Encounter for immunization: Secondary | ICD-10-CM

## 2022-08-23 DIAGNOSIS — R400 Somnolence: Secondary | ICD-10-CM | POA: Diagnosis not present

## 2022-08-23 DIAGNOSIS — R7303 Prediabetes: Secondary | ICD-10-CM | POA: Diagnosis not present

## 2022-08-23 MED ORDER — VALACYCLOVIR HCL 500 MG PO TABS: 500.0000 mg | ORAL_TABLET | Freq: Every day | ORAL | 1 refills | Status: DC

## 2022-08-23 MED ORDER — AMLODIPINE BESYLATE 5 MG PO TABS: 5.0000 mg | ORAL_TABLET | Freq: Every day | ORAL | 1 refills | Status: DC

## 2022-08-23 MED ORDER — ROSUVASTATIN CALCIUM 10 MG PO TABS: 10.0000 mg | ORAL_TABLET | Freq: Every day | ORAL | 0 refills | Status: DC

## 2022-08-23 MED ORDER — SERTRALINE HCL 100 MG PO TABS: ORAL_TABLET | ORAL | 1 refills | Status: DC

## 2022-08-23 NOTE — Progress Notes (Signed)
Subjective:    Patient ID: Steven Gallagher, male    DOB: 11-Nov-1975, 46 y.o.   MRN: [id number]  HPI Essential hypertension - Plan: Hemoglobin A1c, Lipid Panel, Basic Metabolic Panel, Hepatic Function Panel  Prediabetes - Plan: Hemoglobin A1c, Lipid Panel, Basic Metabolic Panel, Hepatic Function Panel  Other hyperlipidemia - Plan: Hemoglobin A1c, Lipid Panel, Basic Metabolic Panel, Hepatic Function Panel  Daytime somnolence - Plan: Hemoglobin A1c, Lipid Panel, Basic Metabolic Panel, Hepatic Function Panel  Immunization due - Plan: Tdap vaccine greater than or equal to 7yo IM  He is also concerned about body odor he states a couple people made mention to them that they wondered if he passed gas-he wondered if the odor of fecal matter could come through his skin I reassured him that this could not happen I also talked with him about how I do not notice any type of odor with him and try to give him reassurance he does have a fair amount of anxiety and states he would like to try increased dose of sertraline Follow up and lab results Discuss tdap vaccine  Review of Systems     Objective:   Physical Exam General-in no acute distress Eyes-no discharge Lungs-respiratory rate normal, CTA CV-no murmurs,RRR Extremities skin warm dry no edema Neuro grossly normal Behavior normal, alert        Assessment & Plan:  1. Essential hypertension HTN- patient seen for follow-up regarding HTN.   Diet, medication compliance, appropriate labs and refills were completed.   Importance of keeping blood pressure under good control to lessen the risk of complications discussed Regular follow-up visits discussed  - Hemoglobin A1c - Lipid Panel - Basic Metabolic Panel - Hepatic Function Panel  2. Prediabetes Watch starches healthy eating portion control keep weight and check it and exercise patient doing cardio and weights - Hemoglobin A1c - Lipid Panel - Basic Metabolic Panel - Hepatic  Function Panel  3. Other hyperlipidemia Patient here for follow-up regarding cholesterol.    Patient relates taking medication on a regular basis Denies problems with medication Importance of dietary measures discussed Regular lab work regarding lipid and liver was checked and if needing additional labs was appropriately ordered  - Hemoglobin A1c - Lipid Panel - Basic Metabolic Panel - Hepatic Function Panel  4. Daytime somnolence Patient with tiredness during the day at times falls asleep if he sits down to watch TV He sleeps about 5 hours at night gets up to help with the children then goes back to bed for another 1 to 2 hours on the weekends he gets more sleep he denies sleepiness when he drives he does have some snoring I have encouraged him to have his wife observe him when he is sleeping to see if he has any apnea spells if he does he needs to let us know and we would help set up sleep study He does not have any somnolence with driving - Hemoglobin A1c - Lipid Panel - Basic Metabolic Panel - Hepatic Function Panel  5. Immunization due Tdap - Tdap vaccine greater than or equal to 7yo IM  Denies being depressed but does have anxieties.  Patient very self-conscious.  He is very nice.  But because of him being self-conscious when people make comments about the odor he wonders if he is emitting an odor that others could tell but he cannot.  I do not detect any odor with him.  I would recommend for him to be more assured that there  is no sign of body odor issues Bump up the dose of sertraline to see if this will help him.  Follow-up within 6 months.  Follow-up sooner problems

## 2022-08-25 ENCOUNTER — Other Ambulatory Visit: Payer: Self-pay | Admitting: Gastroenterology

## 2022-09-03 ENCOUNTER — Ambulatory Visit: Payer: BC Managed Care – PPO | Admitting: Family Medicine

## 2022-09-05 ENCOUNTER — Ambulatory Visit: Payer: BC Managed Care – PPO | Admitting: Gastroenterology

## 2022-09-06 ENCOUNTER — Encounter: Payer: Self-pay | Admitting: Family Medicine

## 2022-09-07 NOTE — Telephone Encounter (Signed)
1.  Looked into it I do not find any evidence that this would be helpful I would not recommend any type of chlorophyll supplements or pH supplements the body does a good job of regulating its pH  As for hemorrhoids the approach is warm compresses or warm sitz bath's 10 to 15 minutes at a time several times per day typically hemorrhoids get better over the course of a couple of weeks certainly if it gets severely worse referral to general surgery for thrombectomy.  May apply Preparation H 4 times daily as needed  The goal is to take in enough fiber or fiber supplements to keep bowel movements soft as well

## 2022-09-11 NOTE — Telephone Encounter (Signed)
I am sorry-I have no scientific information 1 way or the other to say if this is a good idea.  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  I am very skeptical of the claims made by any supplement maker that there product will help with internal deodorant.

## 2022-09-17 ENCOUNTER — Encounter: Payer: Self-pay | Admitting: Family Medicine

## 2022-09-18 NOTE — Telephone Encounter (Signed)
Nurses-I would recommend a visit specifically to discuss anxiety and depression and get a up-to-date PHQ 9 as well as GAD-7 and discussion regarding other options.  There are multiple other options including medications, counseling, even options of consults with mental health specialist  (Too complex for MyChart messaging please help set up follow-up visit thank you)

## 2022-09-19 NOTE — Telephone Encounter (Signed)
Nurses  If Steven Gallagher feels he is having severe depression I would like to see him right away to sit down and discuss this.  If it is more that he is interested in seeing if the sertraline would do better at a higher dose we can increase it Please touch base with him.  If having severe depression schedule him ASAP with me. Otherwise May increase sertraline 100 mg tablet Take 2 daily #60 with no refills till follow-up Follow-up within the next couple weeks to discuss further

## 2022-09-28 ENCOUNTER — Other Ambulatory Visit: Payer: Self-pay | Admitting: Family Medicine

## 2022-10-02 ENCOUNTER — Other Ambulatory Visit: Payer: Self-pay | Admitting: Gastroenterology

## 2022-10-02 ENCOUNTER — Encounter: Payer: Self-pay | Admitting: Gastroenterology

## 2022-10-02 ENCOUNTER — Ambulatory Visit (INDEPENDENT_AMBULATORY_CARE_PROVIDER_SITE_OTHER): Payer: BC Managed Care – PPO | Admitting: Gastroenterology

## 2022-10-02 ENCOUNTER — Ambulatory Visit: Payer: BC Managed Care – PPO | Admitting: Gastroenterology

## 2022-10-02 VITALS — BP 124/84 | HR 86 | Temp 97.7°F | Ht 68.0 in | Wt 176.8 lb

## 2022-10-02 DIAGNOSIS — K638219 Small intestinal bacterial overgrowth, unspecified: Secondary | ICD-10-CM

## 2022-10-02 DIAGNOSIS — K5900 Constipation, unspecified: Secondary | ICD-10-CM

## 2022-10-02 MED ORDER — RIFAXIMIN 550 MG PO TABS: 550.0000 mg | ORAL_TABLET | Freq: Three times a day (TID) | ORAL | 0 refills | Status: DC

## 2022-10-02 MED ORDER — LUBIPROSTONE 24 MCG PO CAPS: 24.0000 ug | ORAL_CAPSULE | Freq: Two times a day (BID) | ORAL | 5 refills | Status: DC

## 2022-10-02 NOTE — Progress Notes (Signed)
Gastroenterology Office Note     Primary Care Physician:  Kathyrn Drown, MD  Primary Gastroenterologist: Dr. Gala Romney    Chief Complaint   Chief Complaint  Patient presents with   Follow-up    Follow up on constipation     History of Present Illness   KYSEAN SWEET is a 47 y.o. male presenting today in follow-up with a history of symptomatic hemorrhoids s/p banding x 4 in 2019/2020 and banding x 2 in 2022, chronic gas/bloating with history of SIBO treated with a course of Xifaxan in the past, second round in October 2021, history of constipation, adenomas with surveillance due in 2028, presenting today due to constipation.   Historically, Linzess 72 mcg caused diarrhea. States trulance hurt his stomach, Colace hurt stomach. Benefiber caused increased gas.    Amitiza 8 mcg po BID worked at first but now not as effective. Drinking more water than before. Taking supplemental fiber. No rectal bleeding. Suppositories prn hemorrhoid issues. Taking probiotic daily. Some gas/bloating. Feels like he secretes a fecal smell.    Past Medical History:  Diagnosis Date   Dizziness    Elevated blood pressure    Family history of colon cancer 03/05/2022   Brother had colon cancer at age 73   Family history of prostate cancer in father 03/05/2022   Dad had prostate cancer late 50s   GERD (gastroesophageal reflux disease)    Hyperglycemia    Hyperlipidemia    Hyperlipidemia 12/17/2019   Hypertension     Past Surgical History:  Procedure Laterality Date   BACTERIAL OVERGROWTH TEST N/A 04/22/2015   Procedure: BACTERIAL OVERGROWTH TEST;  Surgeon: Daneil Dolin, MD;  Location: [facility];  Service: Endoscopy;  Laterality: N/A;  0700   COLONOSCOPY N/A 07/03/2017   dr. Gala Romney: small internal hemorrhoids, otherwise normal   COLONOSCOPY WITH PROPOFOL N/A 06/04/2022   one 8 mm polyp in sigmoid, non-bleeding internal hemorrhoids. Tubular adenoma   ESOPHAGOGASTRODUODENOSCOPY N/A 07/03/2017    Dr. Gala Romney: normal esophagus s/p dilation, small hiatal hernia, normal duodenum   FLEXIBLE SIGMOIDOSCOPY N/A 04/03/2021   mild internal hemorrhoids.   MALONEY DILATION N/A 07/03/2017   Procedure: Venia Minks DILATION;  Surgeon: Daneil Dolin, MD;  Location: [facility];  Service: Endoscopy;  Laterality: N/A;   POLYPECTOMY  06/04/2022   Procedure: POLYPECTOMY;  Surgeon: Daneil Dolin, MD;  Location: [facility];  Service: Endoscopy;;    Current Outpatient Medications  Medication Sig Dispense Refill   amLODipine (NORVASC) 5 MG tablet Take 1 tablet (5 mg total) by mouth daily. 90 tablet 1   clotrimazole (LOTRIMIN) 1 % cream Apply 1 Application topically 2 (two) times daily. 30 g 0   lubiprostone (AMITIZA) 8 MCG capsule TAKE 1 CAPSULE (8 MCG TOTAL) BY MOUTH 2 (TWO) TIMES DAILY WITH A MEAL. 180 capsule 3   omeprazole (PRILOSEC) 20 MG capsule TAKE 1 CAPSULE BY MOUTH EVERY DAY 90 capsule 2   rosuvastatin (CRESTOR) 10 MG tablet Take 1 tablet (10 mg total) by mouth daily. 90 tablet 0   sertraline (ZOLOFT) 100 MG tablet 1.5 tablet daily 135 tablet 1   valACYclovir (VALTREX) 500 MG tablet Take 1 tablet (500 mg total) by mouth daily. (Patient not taking: Reported on 10/02/2022) 90 tablet 1   No current facility-administered medications for this visit.    Allergies as of 10/02/2022   (No Known Allergies)    Family History  Problem Relation Age of Onset   Hypertension Mother  Hypertension Father    Prostate cancer Father    Hypertension Brother    Colon cancer Brother 67   Colon polyps Neg Hx     Social History   Socioeconomic History   Marital status: Married    Spouse name: Not on file   Number of children: Not on file   Years of education: Not on file   Highest education level: Not on file  Occupational History   Not on file  Tobacco Use   Smoking status: Never   Smokeless tobacco: Never  Vaping Use   Vaping Use: Never used  Substance and Sexual Activity   Alcohol use: No    Drug use: No   Sexual activity: Not on file  Other Topics Concern   Not on file  Social History Narrative   Not on file   Social Determinants of Health   Financial Resource Strain: Not on file  Food Insecurity: Not on file  Transportation Needs: Not on file  Physical Activity: Not on file  Stress: Not on file  Social Connections: Not on file  Intimate Partner Violence: Not on file     Review of Systems   Gen: Denies any fever, chills, fatigue, weight loss, lack of appetite.  CV: Denies chest pain, heart palpitations, peripheral edema, syncope.  Resp: Denies shortness of breath at rest or with exertion. Denies wheezing or cough.  GI: Denies dysphagia or odynophagia. Denies jaundice, hematemesis, fecal incontinence. GU : Denies urinary burning, urinary frequency, urinary hesitancy MS: Denies joint pain, muscle weakness, cramps, or limitation of movement.  Derm: Denies rash, itching, dry skin Psych: Denies depression, anxiety, memory loss, and confusion Heme: Denies bruising, bleeding, and enlarged lymph nodes.   Physical Exam   BP 124/84   Pulse 86   Temp 97.7 F (36.5 C)   Ht '5\' 8"'$  (1.727 m)   Wt 176 lb 12.8 oz (80.2 kg)   BMI 26.88 kg/m  General:   Alert and oriented. Pleasant and cooperative. Well-nourished and well-developed.  Head:  Normocephalic and atraumatic. Eyes:  Without icterus Abdomen:  +BS, soft, non-tender and non-distended. No HSM noted. No guarding or rebound. No masses appreciated.  Rectal:  Deferred  Msk:  Symmetrical without gross deformities. Normal posture. Extremities:  Without edema. Neurologic:  Alert and  oriented x4;  grossly normal neurologically. Skin:  Intact without significant lesions or rashes. Psych:  Alert and cooperative. Normal mood and affect.   Assessment   LAMARI BECKLES is a 47 y.o. male presenting today in follow-up with a history of symptomatic hemorrhoids s/p banding x 4 in 2019/2020 and banding x 2 in 2022, chronic  gas/bloating with history of SIBO treated with a course of Xifaxan in the past, second round in October 2021, history of constipation, adenomas with surveillance due in 2028, presenting today due to constipation.   Constipation: not ideally managed on Amitiza 8 mcg BID. Will increase to 24 mcg BID.  SIBO: symptomatic again. Will do another round of Xifaxan. He has done well with this in past.   History of hemorrhoids: will reach out if persistent despite managing constipation. He has had multiple bandings in the past.     PLAN   Amitiza 24 mcg po BID with food Round of Xifaxan for SIBO 3 month return    [name], PhD, [facility] Gastroenterology

## 2022-10-02 NOTE — Patient Instructions (Signed)
I have increased the Amitiza to 24 micrograms. Take 1 capsule twice a day with food.   I have sent in Xifaxan to take three times a day for 2 weeks.  Let me know if you feel you need a banding!  We will see you in 3 months!  I enjoyed seeing you again today! At our first visit, I mentioned how I value our relationship and want to provide genuine, compassionate, and quality care. You may receive a survey regarding your visit with me, and I welcome your feedback! Thanks so much for taking the time to complete this. I look forward to seeing you again.   [name], PhD, ANP-BC [facility] Gastroenterology

## 2022-10-04 NOTE — Telephone Encounter (Signed)
Let's give samples of Trulance. Linzess was not helpful in the past.

## 2022-10-04 NOTE — Telephone Encounter (Signed)
Phoned the pt and no ans/vm to LM. Gave the pt 3 boxes of Trulance 3 mg. Waiting to make contact with the pt

## 2022-10-04 NOTE — Telephone Encounter (Signed)
Patient had been on Amitiza 8 mcg already. I increased to 24 mcg. Does he want to pay the 137 per month or would he like an alternative?

## 2022-10-05 NOTE — Telephone Encounter (Signed)
Phoned and LMOVM for the pt regarding his samples of Trulance being left up front for him and the times we close today and during the week. Also left my desk number for the pt to call

## 2022-10-08 NOTE — Telephone Encounter (Signed)
Steven Gallagher, is this a PA issue or it won't cover?

## 2022-10-09 NOTE — Telephone Encounter (Signed)
I don't know I haven't received anything or heard anything from the insurance

## 2022-10-11 MED ORDER — RIFAXIMIN 550 MG PO TABS: 550.0000 mg | ORAL_TABLET | Freq: Three times a day (TID) | ORAL | 0 refills | Status: AC

## 2022-10-11 NOTE — Addendum Note (Signed)
Addended by: [name] on: 10/11/2022 11:05 AM   Modules accepted: Orders

## 2022-10-15 ENCOUNTER — Encounter: Payer: Self-pay | Admitting: Family Medicine

## 2022-10-15 NOTE — Telephone Encounter (Signed)
Noted   Pt taking Tulance to see if it works

## 2022-10-16 MED ORDER — PANTOPRAZOLE SODIUM 40 MG PO TBEC: DELAYED_RELEASE_TABLET | ORAL | 1 refills | Status: DC

## 2022-10-16 NOTE — Telephone Encounter (Signed)
Nurses Per patient request Stop omeprazole Start Protonix 40 mg daily #90, 1 daily, 1 refills  Hopefully will not run into insurance issue regarding this particular medicine thank you  If having ongoing troubles recommend follow-up office visit

## 2022-10-29 MED ORDER — AMOXICILLIN-POT CLAVULANATE 875-125 MG PO TABS: 1.0000 | ORAL_TABLET | Freq: Two times a day (BID) | ORAL | 0 refills | Status: DC

## 2022-10-29 NOTE — Addendum Note (Signed)
Addended by: [name] on: 10/29/2022 01:55 PM   Modules accepted: Orders

## 2022-11-07 ENCOUNTER — Ambulatory Visit: Payer: BC Managed Care – PPO | Admitting: Gastroenterology

## 2022-11-22 ENCOUNTER — Encounter: Payer: Self-pay | Admitting: Gastroenterology

## 2022-12-27 ENCOUNTER — Other Ambulatory Visit: Payer: Self-pay | Admitting: Family Medicine

## 2022-12-28 ENCOUNTER — Other Ambulatory Visit: Payer: Self-pay | Admitting: Family Medicine

## 2023-02-07 ENCOUNTER — Telehealth: Payer: Self-pay

## 2023-02-07 NOTE — Telephone Encounter (Signed)
Pt has appt with June the 13th and needs blood work ordered .  Steven Gallagher - [phone]

## 2023-02-07 NOTE — Telephone Encounter (Signed)
Please let the patient know that he has active orders as long as he gets the labs completed by June 5 and he can keep his follow-up visit on June 13 thank you

## 2023-02-08 NOTE — Telephone Encounter (Signed)
Message sent to patient to inform per drs notes

## 2023-02-19 DIAGNOSIS — R400 Somnolence: Secondary | ICD-10-CM | POA: Diagnosis not present

## 2023-02-19 DIAGNOSIS — I10 Essential (primary) hypertension: Secondary | ICD-10-CM | POA: Diagnosis not present

## 2023-02-19 DIAGNOSIS — E7849 Other hyperlipidemia: Secondary | ICD-10-CM | POA: Diagnosis not present

## 2023-02-19 DIAGNOSIS — R7303 Prediabetes: Secondary | ICD-10-CM | POA: Diagnosis not present

## 2023-02-20 LAB — HEPATIC FUNCTION PANEL
ALT: 21 IU/L (ref 0–44)
AST: 23 IU/L (ref 0–40)
Albumin: 4.8 g/dL (ref 4.1–5.1)
Alkaline Phosphatase: 84 IU/L (ref 44–121)
Bilirubin Total: 0.4 mg/dL (ref 0.0–1.2)
Bilirubin, Direct: <0.1 mg/dL (ref 0.00–0.40)
Total Protein: 7.5 g/dL (ref 6.0–8.5)

## 2023-02-20 LAB — BASIC METABOLIC PANEL
BUN/Creatinine Ratio: 10 (ref 9–20)
BUN: 12 mg/dL (ref 6–24)
CO2: 26 mmol/L (ref 20–29)
Calcium: 9.8 mg/dL (ref 8.7–10.2)
Chloride: 104 mmol/L (ref 96–106)
Creatinine, Ser: 1.15 mg/dL (ref 0.76–1.27)
Glucose: 101 mg/dL — ABNORMAL HIGH (ref 70–99)
Potassium: 4.8 mmol/L (ref 3.5–5.2)
Sodium: 143 mmol/L (ref 134–144)
eGFR: 80 mL/min/{1.73_m2} (ref >59)

## 2023-02-20 LAB — LIPID PANEL
Chol/HDL Ratio: 3.1 ratio (ref 0.0–5.0)
Cholesterol, Total: 184 mg/dL (ref 100–199)
HDL: 59 mg/dL (ref >39)
LDL Chol Calc (NIH): 104 mg/dL — ABNORMAL HIGH (ref 0–99)
Triglycerides: 117 mg/dL (ref 0–149)
VLDL Cholesterol Cal: 21 mg/dL (ref 5–40)

## 2023-02-20 LAB — HEMOGLOBIN A1C
Est. average glucose Bld gHb Est-mCnc: 137 mg/dL
Hgb A1c MFr Bld: 6.4 % — ABNORMAL HIGH (ref 4.8–5.6)

## 2023-02-21 ENCOUNTER — Ambulatory Visit: Payer: BC Managed Care – PPO | Admitting: Family Medicine

## 2023-02-28 ENCOUNTER — Ambulatory Visit (INDEPENDENT_AMBULATORY_CARE_PROVIDER_SITE_OTHER): Payer: BC Managed Care – PPO | Admitting: Family Medicine

## 2023-02-28 VITALS — BP 127/83 | HR 98 | Ht 68.0 in | Wt 175.4 lb

## 2023-02-28 DIAGNOSIS — Z79899 Other long term (current) drug therapy: Secondary | ICD-10-CM

## 2023-02-28 DIAGNOSIS — I10 Essential (primary) hypertension: Secondary | ICD-10-CM

## 2023-02-28 DIAGNOSIS — R7303 Prediabetes: Secondary | ICD-10-CM | POA: Diagnosis not present

## 2023-02-28 DIAGNOSIS — E7849 Other hyperlipidemia: Secondary | ICD-10-CM

## 2023-02-28 MED ORDER — AMLODIPINE BESYLATE 5 MG PO TABS: 5.0000 mg | ORAL_TABLET | Freq: Every day | ORAL | 1 refills | Status: DC

## 2023-02-28 MED ORDER — ROSUVASTATIN CALCIUM 10 MG PO TABS: 10.0000 mg | ORAL_TABLET | Freq: Every day | ORAL | 1 refills | Status: DC

## 2023-02-28 MED ORDER — PANTOPRAZOLE SODIUM 40 MG PO TBEC: DELAYED_RELEASE_TABLET | ORAL | 1 refills | Status: DC

## 2023-02-28 MED ORDER — VALACYCLOVIR HCL 500 MG PO TABS: 500.0000 mg | ORAL_TABLET | Freq: Every day | ORAL | 3 refills | Status: DC

## 2023-02-28 NOTE — Progress Notes (Signed)
   Subjective:    Patient ID: Steven Gallagher, male    DOB: [birth year], 47 y.o.   MRN: [id number]  HPI Patient arrives for 5 to 6 month follow up. Patient states he needs refills. Patient states he is concerned about his blood work.  Very nice patient He is bummed that his A1c is going up He states he really does not eat as healthy as he should mainly because his wife is very busy with 2 autistic children Results for orders placed or performed in visit on 08/23/22  Hemoglobin A1c  Result Value Ref Range   Hgb A1c MFr Bld 6.4 (H) 4.8 - 5.6 %   Est. average glucose Bld gHb Est-mCnc 137 mg/dL  Lipid Panel  Result Value Ref Range   Cholesterol, Total 184 100 - 199 mg/dL   Triglycerides 829 0 - 149 mg/dL   HDL 59 >56 mg/dL   VLDL Cholesterol Cal 21 5 - 40 mg/dL   LDL Chol Calc (NIH) 213 (H) 0 - 99 mg/dL   Chol/HDL Ratio 3.1 0.0 - 5.0 ratio  Basic Metabolic Panel  Result Value Ref Range   Glucose 101 (H) 70 - 99 mg/dL   BUN 12 6 - 24 mg/dL   Creatinine, Ser 0.86 0.76 - 1.27 mg/dL   eGFR 80 >57 QI/ONG/2.95   BUN/Creatinine Ratio 10 9 - 20   Sodium 143 134 - 144 mmol/L   Potassium 4.8 3.5 - 5.2 mmol/L   Chloride 104 96 - 106 mmol/L   CO2 26 20 - 29 mmol/L   Calcium 9.8 8.7 - 10.2 mg/dL  Hepatic Function Panel  Result Value Ref Range   Total Protein 7.5 6.0 - 8.5 g/dL   Albumin 4.8 4.1 - 5.1 g/dL   Bilirubin Total 0.4 0.0 - 1.2 mg/dL   Bilirubin, Direct <2.84 0.00 - 0.40 mg/dL   Alkaline Phosphatase 84 44 - 121 IU/L   AST 23 0 - 40 IU/L   ALT 21 0 - 44 IU/L   Essential hypertension  Prediabetes  Other hyperlipidemia  Patient for blood pressure check up.  The patient does have hypertension.   Patient relates dietary measures try to minimize salt The importance of healthy diet and activity were discussed Patient relates compliance Patient with mild hyperlipidemia takes his medication tolerates it well Patient with GAD and does well with sertraline we will continue  this Reflux under good control we will continue this Prediabetes-A1c getting worse diabetic education regarding diet was given Suppression of herpes Valtrex and  Review of Systems     Objective:   Physical Exam General-in no acute distress Eyes-no discharge Lungs-respiratory rate normal, CTA CV-no murmurs,RRR Extremities skin warm dry no edema Neuro grossly normal Behavior normal, alert Abdomen soft no masses GU normal  I did talk with him regarding dietary measures I printed some information and he is going to work harder at dietary measures were read look at labs again in the fall if they are getting worse may need to initiate medicines     Assessment & Plan:  1. Essential hypertension Blood pressure good continue current measures healthy diet recommended Continue medication 2. Prediabetes A1c 6.4, portion control, dietary measures, healthy eating and exercise, try to bring weight down 5 pounds  3. Other hyperlipidemia Continue healthy diet check labs before next visit Labs reviewed continue medication GERD under good control  Follow-up in approximately 5 months 6 months

## 2023-03-27 DIAGNOSIS — S92424A Nondisplaced fracture of distal phalanx of right great toe, initial encounter for closed fracture: Secondary | ICD-10-CM | POA: Diagnosis not present

## 2023-04-29 DIAGNOSIS — S92911D Unspecified fracture of right toe(s), subsequent encounter for fracture with routine healing: Secondary | ICD-10-CM | POA: Diagnosis not present

## 2023-04-30 ENCOUNTER — Encounter: Payer: Self-pay | Admitting: Family Medicine

## 2023-04-30 ENCOUNTER — Other Ambulatory Visit: Payer: Self-pay

## 2023-04-30 DIAGNOSIS — R7303 Prediabetes: Secondary | ICD-10-CM

## 2023-04-30 DIAGNOSIS — E7849 Other hyperlipidemia: Secondary | ICD-10-CM

## 2023-04-30 DIAGNOSIS — Z79899 Other long term (current) drug therapy: Secondary | ICD-10-CM

## 2023-04-30 DIAGNOSIS — Z125 Encounter for screening for malignant neoplasm of prostate: Secondary | ICD-10-CM

## 2023-04-30 MED ORDER — SERTRALINE HCL 100 MG PO TABS: ORAL_TABLET | ORAL | 1 refills | Status: DC

## 2023-04-30 NOTE — Telephone Encounter (Signed)
May have 90-day on sertraline +1 additional refill  He will need to do lab work before his follow-up office visit I would recommend lipid, liver, metabolic 7, PSA, urine ACR, A1c  Diagnosis prediabetes, hypertension, hyperlipidemia, high risk med

## 2023-08-29 ENCOUNTER — Ambulatory Visit: Payer: BC Managed Care – PPO | Admitting: Family Medicine

## 2023-11-25 ENCOUNTER — Telehealth: Payer: Self-pay | Admitting: *Deleted

## 2023-11-25 NOTE — Telephone Encounter (Unsigned)
 Copied from CRM [id number]. Topic: Appointments - Appointment Scheduling >> Nov 25, 2023 11:45 AM Marlow Baars wrote: The patient called in to schedule his physical and to get his blood work done. It has been almost a year since he was last seen. I will assist with scheduling and confirm the details with him. The patients provider is scheduled out out until June and after asking Alcario Drought she says it is ok to schedule him with another provider. I scheduled the appt and the patient would like to get the blood work and HIV testing done sooner so he can go over that with the provider at the time of his physical. Please assist patient further

## 2023-11-26 ENCOUNTER — Telehealth: Admitting: Physician Assistant

## 2023-11-26 DIAGNOSIS — J069 Acute upper respiratory infection, unspecified: Secondary | ICD-10-CM | POA: Diagnosis not present

## 2023-11-26 DIAGNOSIS — B9689 Other specified bacterial agents as the cause of diseases classified elsewhere: Secondary | ICD-10-CM

## 2023-11-27 ENCOUNTER — Other Ambulatory Visit: Payer: Self-pay | Admitting: Nurse Practitioner

## 2023-11-27 ENCOUNTER — Telehealth: Payer: Self-pay | Admitting: Family Medicine

## 2023-11-27 DIAGNOSIS — Z114 Encounter for screening for human immunodeficiency virus [HIV]: Secondary | ICD-10-CM

## 2023-11-27 DIAGNOSIS — Z79899 Other long term (current) drug therapy: Secondary | ICD-10-CM

## 2023-11-27 DIAGNOSIS — K921 Melena: Secondary | ICD-10-CM

## 2023-11-27 DIAGNOSIS — Z125 Encounter for screening for malignant neoplasm of prostate: Secondary | ICD-10-CM

## 2023-11-27 DIAGNOSIS — E7849 Other hyperlipidemia: Secondary | ICD-10-CM

## 2023-11-27 DIAGNOSIS — R7303 Prediabetes: Secondary | ICD-10-CM

## 2023-11-27 DIAGNOSIS — I10 Essential (primary) hypertension: Secondary | ICD-10-CM

## 2023-11-27 MED ORDER — FLUTICASONE PROPIONATE 50 MCG/ACT NA SUSP: 2.0000 | Freq: Every day | NASAL | 0 refills | Status: AC

## 2023-11-27 MED ORDER — BENZONATATE 100 MG PO CAPS: 100.0000 mg | ORAL_CAPSULE | Freq: Three times a day (TID) | ORAL | 0 refills | Status: DC | PRN

## 2023-11-27 MED ORDER — AZITHROMYCIN 250 MG PO TABS: ORAL_TABLET | ORAL | 0 refills | Status: AC

## 2023-11-27 NOTE — Telephone Encounter (Signed)
Patient is requesting labs before his next appointment

## 2023-11-27 NOTE — Progress Notes (Signed)
 E-Visit for Cough   We are sorry that you are not feeling well.  Here is how we plan to help!  Based on your presentation I believe you most likely have A cough due to bacteria.  When patients have a fever and a productive cough with a change in color or increased sputum production, we are concerned about bacterial bronchitis.  If left untreated it can progress to pneumonia.  If your symptoms do not improve with your treatment plan it is important that you contact your provider.   I have prescribed Azithromyin 250 mg: two tablets now and then one tablet daily for 4 additonal days    In addition you may use A non-prescription cough medication called Mucinex DM: take 2 tablets every 12 hours. and A prescription cough medication called Tessalon Perles 100mg . You may take 1-2 capsules every 8 hours as needed for your cough.  I have also prescribed Fluticasone (Flonase) nasal spray for the nasal congestion and drainage. Use 2 sprays in each nostril once daily for 10-14 days.  From your responses in the eVisit questionnaire you describe inflammation in the upper respiratory tract which is causing a significant cough.  This is commonly called Bronchitis and has four common causes:   Allergies Viral Infections Acid Reflux Bacterial Infection Allergies, viruses and acid reflux are treated by controlling symptoms or eliminating the cause. An example might be a cough caused by taking certain blood pressure medications. You stop the cough by changing the medication. Another example might be a cough caused by acid reflux. Controlling the reflux helps control the cough.  USE OF BRONCHODILATOR ("RESCUE") INHALERS: There is a risk from using your bronchodilator too frequently.  The risk is that over-reliance on a medication which only relaxes the muscles surrounding the breathing tubes can reduce the effectiveness of medications prescribed to reduce swelling and congestion of the tubes themselves.  Although you  feel brief relief from the bronchodilator inhaler, your asthma may actually be worsening with the tubes becoming more swollen and filled with mucus.  This can delay other crucial treatments, such as oral steroid medications. If you need to use a bronchodilator inhaler daily, several times per day, you should discuss this with your provider.  There are probably better treatments that could be used to keep your asthma under control.     HOME CARE Only take medications as instructed by your medical team. Complete the entire course of an antibiotic. Drink plenty of fluids and get plenty of rest. Avoid close contacts especially the very young and the elderly Cover your mouth if you cough or cough into your sleeve. Always remember to wash your hands A steam or ultrasonic humidifier can help congestion.   GET HELP RIGHT AWAY IF: You develop worsening fever. You become short of breath You cough up blood. Your symptoms persist after you have completed your treatment plan MAKE SURE YOU  Understand these instructions. Will watch your condition. Will get help right away if you are not doing well or get worse.    Thank you for choosing an e-visit.  Your e-visit answers were reviewed by a board certified advanced clinical practitioner to complete your personal care plan. Depending upon the condition, your plan could have included both over the counter or prescription medications.  Please review your pharmacy choice. Make sure the pharmacy is open so you can pick up prescription now. If there is a problem, you may contact your provider through [employer] and have the prescription  routed to another pharmacy.  Your safety is important to [place]. If you have drug allergies check your prescription carefully.   For the next 24 hours you can use MyChart to ask questions about today's visit, request a non-urgent call back, or ask for a work or school excuse. You will get an email in the next two days  asking about your experience. I hope that your e-visit has been valuable and will speed your recovery.   I have spent 5 minutes in review of e-visit questionnaire, review and updating patient chart, medical decision making and response to patient.   Margaretann Loveless, PA-C

## 2023-11-27 NOTE — Telephone Encounter (Signed)
Duplicate- see other message

## 2023-12-01 ENCOUNTER — Other Ambulatory Visit: Payer: Self-pay | Admitting: Family Medicine

## 2023-12-02 DIAGNOSIS — R7303 Prediabetes: Secondary | ICD-10-CM | POA: Diagnosis not present

## 2023-12-02 DIAGNOSIS — Z114 Encounter for screening for human immunodeficiency virus [HIV]: Secondary | ICD-10-CM | POA: Diagnosis not present

## 2023-12-02 DIAGNOSIS — I10 Essential (primary) hypertension: Secondary | ICD-10-CM | POA: Diagnosis not present

## 2023-12-02 DIAGNOSIS — Z125 Encounter for screening for malignant neoplasm of prostate: Secondary | ICD-10-CM | POA: Diagnosis not present

## 2023-12-02 DIAGNOSIS — E7849 Other hyperlipidemia: Secondary | ICD-10-CM | POA: Diagnosis not present

## 2023-12-02 DIAGNOSIS — K921 Melena: Secondary | ICD-10-CM | POA: Diagnosis not present

## 2023-12-03 LAB — CBC WITH DIFFERENTIAL/PLATELET
Basophils Absolute: 0.1 10*3/uL (ref 0.0–0.2)
Basos: 1 %
EOS (ABSOLUTE): 0.2 10*3/uL (ref 0.0–0.4)
Eos: 3 %
Hematocrit: 42.9 % (ref 37.5–51.0)
Hemoglobin: 13.7 g/dL (ref 13.0–17.7)
Immature Grans (Abs): 0 10*3/uL (ref 0.0–0.1)
Immature Granulocytes: 1 %
Lymphocytes Absolute: 2.1 10*3/uL (ref 0.7–3.1)
Lymphs: 37 %
MCH: 26.5 pg — ABNORMAL LOW (ref 26.6–33.0)
MCHC: 31.9 g/dL (ref 31.5–35.7)
MCV: 83 fL (ref 79–97)
Monocytes Absolute: 0.4 10*3/uL (ref 0.1–0.9)
Monocytes: 6 %
Neutrophils Absolute: 3 10*3/uL (ref 1.4–7.0)
Neutrophils: 52 %
Platelets: 222 10*3/uL (ref 150–450)
RBC: 5.17 x10E6/uL (ref 4.14–5.80)
RDW: 13.2 % (ref 11.6–15.4)
WBC: 5.7 10*3/uL (ref 3.4–10.8)

## 2023-12-03 LAB — HIV ANTIBODY (ROUTINE TESTING W REFLEX): HIV Screen 4th Generation wRfx: NONREACTIVE

## 2023-12-03 LAB — LIPID PANEL
Chol/HDL Ratio: 3.3 ratio (ref 0.0–5.0)
Cholesterol, Total: 179 mg/dL (ref 100–199)
HDL: 55 mg/dL (ref >39)
LDL Chol Calc (NIH): 107 mg/dL — ABNORMAL HIGH (ref 0–99)
Triglycerides: 94 mg/dL (ref 0–149)
VLDL Cholesterol Cal: 17 mg/dL (ref 5–40)

## 2023-12-03 LAB — HEMOGLOBIN A1C
Est. average glucose Bld gHb Est-mCnc: 131 mg/dL
Hgb A1c MFr Bld: 6.2 % — ABNORMAL HIGH (ref 4.8–5.6)

## 2023-12-03 LAB — COMPREHENSIVE METABOLIC PANEL
ALT: 18 IU/L (ref 0–44)
AST: 18 IU/L (ref 0–40)
Albumin: 4.6 g/dL (ref 4.1–5.1)
Alkaline Phosphatase: 78 IU/L (ref 44–121)
BUN/Creatinine Ratio: 13 (ref 9–20)
BUN: 16 mg/dL (ref 6–24)
Bilirubin Total: 0.3 mg/dL (ref 0.0–1.2)
CO2: 24 mmol/L (ref 20–29)
Calcium: 9.5 mg/dL (ref 8.7–10.2)
Chloride: 103 mmol/L (ref 96–106)
Creatinine, Ser: 1.23 mg/dL (ref 0.76–1.27)
Globulin, Total: 2.4 g/dL (ref 1.5–4.5)
Glucose: 119 mg/dL — ABNORMAL HIGH (ref 70–99)
Potassium: 4.4 mmol/L (ref 3.5–5.2)
Sodium: 141 mmol/L (ref 134–144)
Total Protein: 7 g/dL (ref 6.0–8.5)
eGFR: 73 mL/min/{1.73_m2} (ref >59)

## 2023-12-03 LAB — MICROALBUMIN / CREATININE URINE RATIO
Creatinine, Urine: 170.3 mg/dL
Microalb/Creat Ratio: 2 mg/g{creat} (ref 0–29)
Microalbumin, Urine: 3.7 ug/mL

## 2023-12-03 LAB — PSA: Prostate Specific Ag, Serum: 1.3 ng/mL (ref 0.0–4.0)

## 2023-12-04 ENCOUNTER — Encounter: Payer: Self-pay | Admitting: Nurse Practitioner

## 2023-12-06 ENCOUNTER — Telehealth: Admitting: Physician Assistant

## 2023-12-06 DIAGNOSIS — B9689 Other specified bacterial agents as the cause of diseases classified elsewhere: Secondary | ICD-10-CM

## 2023-12-06 NOTE — Progress Notes (Signed)
  Because you have failed a first-line treatment, I feel your condition warrants further evaluation and I recommend that you be seen in a face-to-face visit.   NOTE: There will be NO CHARGE for this E-Visit   If you are having a true medical emergency, please call 911.     For an urgent face to face visit, [place] has multiple urgent care centers for your convenience.  Click the link below for the full list of locations and hours, walk-in wait times, appointment scheduling options and driving directions:  Urgent Care - [place], [place], [place], [place], [place], [place]  [place]     Your MyChart E-visit questionnaire answers were reviewed by a board certified advanced clinical practitioner to complete your personal care plan based on your specific symptoms.    Thank you for using e-Visits.   I have spent 5 minutes in review of e-visit questionnaire, review and updating patient chart, medical decision making and response to patient.   Margaretann Loveless, PA-C

## 2023-12-08 ENCOUNTER — Other Ambulatory Visit: Payer: Self-pay | Admitting: Nurse Practitioner

## 2023-12-08 MED ORDER — AMOXICILLIN-POT CLAVULANATE 875-125 MG PO TABS: 1.0000 | ORAL_TABLET | Freq: Two times a day (BID) | ORAL | 0 refills | Status: DC

## 2023-12-30 ENCOUNTER — Ambulatory Visit (INDEPENDENT_AMBULATORY_CARE_PROVIDER_SITE_OTHER): Admitting: Nurse Practitioner

## 2023-12-30 ENCOUNTER — Encounter: Payer: Self-pay | Admitting: Nurse Practitioner

## 2023-12-30 VITALS — BP 143/76 | HR 100 | Temp 98.6°F | Ht 68.0 in | Wt 183.0 lb

## 2023-12-30 DIAGNOSIS — Z0001 Encounter for general adult medical examination with abnormal findings: Secondary | ICD-10-CM | POA: Diagnosis not present

## 2023-12-30 DIAGNOSIS — R5383 Other fatigue: Secondary | ICD-10-CM

## 2023-12-30 DIAGNOSIS — Z113 Encounter for screening for infections with a predominantly sexual mode of transmission: Secondary | ICD-10-CM

## 2023-12-30 DIAGNOSIS — R0683 Snoring: Secondary | ICD-10-CM | POA: Diagnosis not present

## 2023-12-30 DIAGNOSIS — Z1159 Encounter for screening for other viral diseases: Secondary | ICD-10-CM | POA: Diagnosis not present

## 2023-12-30 DIAGNOSIS — Z0000 Encounter for general adult medical examination without abnormal findings: Secondary | ICD-10-CM

## 2023-12-30 MED ORDER — TRIAMCINOLONE ACETONIDE 0.1 % EX CREA: 1.0000 | TOPICAL_CREAM | Freq: Two times a day (BID) | CUTANEOUS | 0 refills | Status: DC

## 2023-12-30 MED ORDER — KETOCONAZOLE 2 % EX CREA: 1.0000 | TOPICAL_CREAM | Freq: Two times a day (BID) | CUTANEOUS | 0 refills | Status: AC

## 2023-12-30 NOTE — Progress Notes (Signed)
 Subjective:    Patient ID: Steven Gallagher, male    DOB: Sep 15, 1976, 48 y.o.   MRN: [id number]  HPI The patient comes in today for a wellness visit.    A review of their health history was completed.  A review of medications was also completed.  Any needed refills; none  Eating habits: good overall; eats fruits and vegetables  Falls/  MVA accidents in past few months: none  Regular exercise: very active job; tries to do cardio at home 3 days per week  Specialist pt sees on regular basis: none at this time  Preventative health issues were discussed.   Additional concerns: skin on the testicles dry with occasional itching; had a dry bump in the past few days. Non tender. Occasional red rash with the heat. Married, same sexual partner. No new partners in years.  Regular dental and vision exams.  Social History   Tobacco Use   Smoking status: Never   Smokeless tobacco: Never  Vaping Use   Vaping status: Never Used  Substance Use Topics   Alcohol use: No   Drug use: No    Review of Systems  Constitutional:  Positive for fatigue. Negative for activity change and appetite change.       Generalized fatigue and sleepiness; has been told by wife that he snores  HENT:  Negative for sore throat and trouble swallowing.   Respiratory:  Negative for cough, chest tightness, shortness of breath and wheezing.   Cardiovascular:  Negative for chest pain.  Gastrointestinal:  Negative for abdominal distention, abdominal pain, blood in stool, constipation, diarrhea, nausea and vomiting.  Genitourinary:  Negative for difficulty urinating, dysuria, frequency, genital sores, penile discharge, penile pain, penile swelling, scrotal swelling, testicular pain and urgency.  Psychiatric/Behavioral:  Negative for sleep disturbance.        States he falls asleep and sleeps well.       12/30/2023   10:41 AM  Depression screen PHQ 2/9  Decreased Interest 0  Down, Depressed, Hopeless 1  PHQ - 2  Score 1  Altered sleeping 0  Tired, decreased energy 0  Change in appetite 0  Feeling bad or failure about yourself  1  Trouble concentrating 0  Moving slowly or fidgety/restless 0  Suicidal thoughts 1  PHQ-9 Score 3  Difficult doing work/chores Not difficult at all      12/30/2023   10:41 AM 02/28/2023   11:43 AM 08/23/2022   10:52 AM 05/11/2019   10:17 AM  GAD 7 : Generalized Anxiety Score  Nervous, Anxious, on Edge 1 0 1 3  Control/stop worrying 0 0 0 0  Worry too much - different things 0 0 1 2  Trouble relaxing 0 0 0 0  Restless 0 0 0 0  Easily annoyed or irritable 0 0 0 0  Afraid - awful might happen 0 0 0 0  Total GAD 7 Score 1 0 2 5  Anxiety Difficulty Not difficult at all  Not difficult at all          Objective:   Physical Exam Vitals and nursing note reviewed.  Constitutional:      General: He is not in acute distress.    Appearance: He is well-developed.  Neck:     Thyroid: No thyromegaly.     Trachea: No tracheal deviation.     Comments: Thyroid non tender, no mass or goiter noted.  Cardiovascular:     Rate and Rhythm: Normal rate and regular rhythm.  Heart sounds: Normal heart sounds. No murmur heard. Pulmonary:     Effort: Pulmonary effort is normal.     Breath sounds: Normal breath sounds.  Abdominal:     General: There is no distension.     Palpations: Abdomen is soft.     Tenderness: There is no abdominal tenderness.  Genitourinary:    Penis: Normal.      Testes: Normal.     Comments: Scrotum: slightly dry skin with no lesions. A few fine non erythematous papules at the hair follicles.  Musculoskeletal:     Cervical back: Normal range of motion and neck supple.  Skin:    General: Skin is warm and dry.  Neurological:     Mental Status: He is alert and oriented to person, place, and time.     Deep Tendon Reflexes: Reflexes are normal and symmetric.  Psychiatric:        Mood and Affect: Mood normal.        Behavior: Behavior normal.         Thought Content: Thought content normal.        Judgment: Judgment normal.    Today's Vitals   12/30/23 1036  BP: (!) 143/76  Pulse: 100  Temp: 98.6 F (37 C)  SpO2: 98%  Weight: 183 lb (83 kg)  Height: 5\' 8"  (1.727 m)   Body mass index is 27.83 kg/m. Results for orders placed or performed in visit on 11/27/23  CBC with Differential/Platelet   Collection Time: 12/02/23  8:09 AM  Result Value Ref Range   WBC 5.7 3.4 - 10.8 x10E3/uL   RBC 5.17 4.14 - 5.80 x10E6/uL   Hemoglobin 13.7 13.0 - 17.7 g/dL   Hematocrit 57.8 46.9 - 51.0 %   MCV 83 79 - 97 fL   MCH 26.5 (L) 26.6 - 33.0 pg   MCHC 31.9 31.5 - 35.7 g/dL   RDW 62.9 52.8 - 41.3 %   Platelets 222 150 - 450 x10E3/uL   Neutrophils 52 Not Estab. %   Lymphs 37 Not Estab. %   Monocytes 6 Not Estab. %   Eos 3 Not Estab. %   Basos 1 Not Estab. %   Neutrophils Absolute 3.0 1.4 - 7.0 x10E3/uL   Lymphocytes Absolute 2.1 0.7 - 3.1 x10E3/uL   Monocytes Absolute 0.4 0.1 - 0.9 x10E3/uL   EOS (ABSOLUTE) 0.2 0.0 - 0.4 x10E3/uL   Basophils Absolute 0.1 0.0 - 0.2 x10E3/uL   Immature Granulocytes 1 Not Estab. %   Immature Grans (Abs) 0.0 0.0 - 0.1 x10E3/uL  HIV Antibody (routine testing w rflx)   Collection Time: 12/02/23  8:09 AM  Result Value Ref Range   HIV Screen 4th Generation wRfx Non Reactive Non Reactive  Comprehensive metabolic panel   Collection Time: 12/02/23  8:09 AM  Result Value Ref Range   Glucose 119 (H) 70 - 99 mg/dL   BUN 16 6 - 24 mg/dL   Creatinine, Ser 2.44 0.76 - 1.27 mg/dL   eGFR 73 >01 UU/VOZ/3.66   BUN/Creatinine Ratio 13 9 - 20   Sodium 141 134 - 144 mmol/L   Potassium 4.4 3.5 - 5.2 mmol/L   Chloride 103 96 - 106 mmol/L   CO2 24 20 - 29 mmol/L   Calcium 9.5 8.7 - 10.2 mg/dL   Total Protein 7.0 6.0 - 8.5 g/dL   Albumin 4.6 4.1 - 5.1 g/dL   Globulin, Total 2.4 1.5 - 4.5 g/dL   Bilirubin Total 0.3 0.0 - 1.2  mg/dL   Alkaline Phosphatase 78 44 - 121 IU/L   AST 18 0 - 40 IU/L   ALT 18 0 - 44 IU/L   Hemoglobin A1c   Collection Time: 12/02/23  8:09 AM  Result Value Ref Range   Hgb A1c MFr Bld 6.2 (H) 4.8 - 5.6 %   Est. average glucose Bld gHb Est-mCnc 131 mg/dL  Lipid panel   Collection Time: 12/02/23  8:09 AM  Result Value Ref Range   Cholesterol, Total 179 100 - 199 mg/dL   Triglycerides 94 0 - 149 mg/dL   HDL 55 >16 mg/dL   VLDL Cholesterol Cal 17 5 - 40 mg/dL   LDL Chol Calc (NIH) 109 (H) 0 - 99 mg/dL   Chol/HDL Ratio 3.3 0.0 - 5.0 ratio  PSA   Collection Time: 12/02/23  8:09 AM  Result Value Ref Range   Prostate Specific Ag, Serum 1.3 0.0 - 4.0 ng/mL  Microalbumin/Creatinine Ratio, Urine   Collection Time: 12/02/23  8:09 AM  Result Value Ref Range   Creatinine, Urine 170.3 Not Estab. mg/dL   Microalbumin, Urine 3.7 Not Estab. ug/mL   Microalb/Creat Ratio 2 0 - 29 mg/g creat   Reviewed labs with patient during visit.         Assessment & Plan:  Annual physical exam  Fatigue, unspecified type - Plan: T4, free, TSH  Snoring  Encounter for hepatitis C screening test for low risk patient - Plan: Hepatitis C antibody  Screening examination for STI - Plan: RPR  Labs ordered.  Meds ordered this encounter  Medications   triamcinolone cream (KENALOG) 0.1 %    Sig: Apply 1 Application topically 2 (two) times daily. Prn rash in the private area up to 2 weeks at at time    Dispense:  30 g    Refill:  0    Supervising Provider:   Charlotta Cook A [[id number]]   ketoconazole (NIZORAL) 2 % cream    Sig: Apply 1 Application topically 2 (two) times daily. Prn jock itch or redness in the private area    Dispense:  30 g    Refill:  0    Supervising Provider:   Charlotta Cook A [[id number]]   Use prescription creams sparingly prn as directed. Encouraged continued healthy diet including limiting sugar, simple carbs and fried foods.  Given a copy of the [place] questionnaire to take home and complete. Discussed possible OSA. Return in about 6 months (around 06/30/2024).

## 2023-12-31 ENCOUNTER — Encounter: Payer: Self-pay | Admitting: Nurse Practitioner

## 2023-12-31 LAB — TSH: TSH: 2.96 u[IU]/mL (ref 0.450–4.500)

## 2023-12-31 LAB — HEPATITIS C ANTIBODY: Hep C Virus Ab: NONREACTIVE

## 2023-12-31 LAB — RPR: RPR Ser Ql: NONREACTIVE

## 2023-12-31 LAB — T4, FREE: Free T4: 0.94 ng/dL (ref 0.82–1.77)

## 2024-01-18 ENCOUNTER — Telehealth: Admitting: Nurse Practitioner

## 2024-01-18 ENCOUNTER — Telehealth: Admitting: Family Medicine

## 2024-01-18 DIAGNOSIS — R197 Diarrhea, unspecified: Secondary | ICD-10-CM

## 2024-01-18 DIAGNOSIS — R6889 Other general symptoms and signs: Secondary | ICD-10-CM

## 2024-01-18 NOTE — Progress Notes (Signed)
 I have spent 5 minutes in review of e-visit questionnaire, review and updating patient chart, medical decision making and response to patient.   Claiborne Rigg, NP

## 2024-01-18 NOTE — Progress Notes (Signed)
E visit for Flu like symptoms   We are sorry that you are not feeling well.  Here is how we plan to help! Based on what you have shared with me it looks like you may have a respiratory virus that may be influenza.  Influenza or "the flu" is   an infection caused by a respiratory virus. The flu virus is highly contagious and persons who did not receive their yearly flu vaccination may "catch" the flu from close contact.  We have anti-viral medications to treat the viruses that cause this infection. They are not a "cure" and only shorten the course of the infection. These prescriptions are most effective when they are given within the first 2 days of "flu" symptoms. Antiviral medication are indicated if you have a high risk of complications from the flu. You should  also consider an antiviral medication if you are in close contact with someone who is at risk. These medications can help patients avoid complications from the flu  but have side effects that you should know. Possible side effects from Tamiflu or oseltamivir include nausea, vomiting, diarrhea, dizziness, headaches, eye redness, sleep problems or other respiratory symptoms. You should not take Tamiflu if you have an allergy to oseltamivir or any to the ingredients in Tamiflu.  Based upon your symptoms and potential risk factors I recommend that you follow the flu symptoms recommendation that I have listed below.  ANYONE WHO HAS FLU SYMPTOMS SHOULD: . Stay home. The flu is highly contagious and going out or to work exposes others! . Be sure to drink plenty of fluids. Water is fine as well as fruit juices, sodas and electrolyte beverages. You may want to stay away from caffeine or alcohol. If you are nauseated, try taking small sips of liquids. How do you know if you are getting enough fluid? Your urine should be a pale yellow or almost colorless. . Get rest. . Taking a steamy shower or using a humidifier may help nasal congestion and ease  sore throat pain. Using a saline nasal spray works much the same way. . Cough drops, hard candies and sore throat lozenges may ease your cough. . Line up a caregiver. Have someone check on you regularly.   GET HELP RIGHT AWAY IF: . You cannot keep down liquids or your medications. . You become short of breath . Your fell like you are going to pass out or loose consciousness. . Your symptoms persist after you have completed your treatment plan MAKE SURE YOU   Understand these instructions.  Will watch your condition.  Will get help right away if you are not doing well or get worse.  Your e-visit answers were reviewed by a board certified advanced clinical practitioner to complete your personal care plan.  Depending on the condition, your plan could have included both over the counter or prescription medications.  If there is a problem please reply  once you have received a response from your provider.  Your safety is important to [place].  If you have drug allergies check your prescription carefully.    You can use MyChart to ask questions about today's visit, request a non-urgent call back, or ask for a work or school excuse for 24 hours related to this e-Visit. If it has been greater than 24 hours you will need to follow up with your provider, or enter a new e-Visit to address those concerns.  You will get an e-mail in the next two days asking about  your experience.  I hope that your e-visit has been valuable and will speed your recovery. Thank you for using e-visits.

## 2024-01-19 NOTE — Progress Notes (Signed)
E-Visit for Diarrhea  We are sorry that you are not feeling well.  Here is how we plan to help!  Based on what you have shared with me it looks like you have Acute Infectious Diarrhea.  Most cases of acute diarrhea are due to infections with virus and bacteria and are self-limited conditions lasting less than 14 days.  For your symptoms you may take Imodium 2 mg tablets that are over the counter at your local pharmacy. Take two tablet now and then one after each loose stool up to 6 a day.  Antibiotics are not needed for most people with diarrhea.   HOME CARE We recommend changing your diet to help with your symptoms for the next few days. Drink plenty of fluids that contain water salt and sugar. Sports drinks such as Gatorade may help.  You may try broths, soups, bananas, applesauce, soft breads, mashed potatoes or crackers.  You are considered infectious for as long as the diarrhea continues. Hand washing or use of alcohol based hand sanitizers is recommend. It is best to stay out of work or school until your symptoms stop.   GET HELP RIGHT AWAY If you have dark yellow colored urine or do not pass urine frequently you should drink more fluids.   If your symptoms worsen  If you feel like you are going to pass out (faint) You have a new problem  MAKE SURE YOU  Understand these instructions. Will watch your condition. Will get help right away if you are not doing well or get worse.  Thank you for choosing an e-visit.  Your e-visit answers were reviewed by a board certified advanced clinical practitioner to complete your personal care plan. Depending upon the condition, your plan could have included both over the counter or prescription medications.  Please review your pharmacy choice. Make sure the pharmacy is open so you can pick up prescription now. If there is a problem, you may contact your provider through [employer] and have the prescription routed to another pharmacy.   Your safety is important to [place]. If you have drug allergies check your prescription carefully.   For the next 24 hours you can use MyChart to ask questions about today's visit, request a non-urgent call back, or ask for a work or school excuse. You will get an email in the next two days asking about your experience. I hope that your e-visit has been valuable and will speed your recovery.    have provided 5 minutes of non face to face time during this encounter for chart review and documentation.

## 2024-01-28 ENCOUNTER — Ambulatory Visit: Admitting: Nurse Practitioner

## 2024-01-29 ENCOUNTER — Encounter: Payer: Self-pay | Admitting: Family Medicine

## 2024-01-29 ENCOUNTER — Ambulatory Visit (INDEPENDENT_AMBULATORY_CARE_PROVIDER_SITE_OTHER): Admitting: Family Medicine

## 2024-01-29 VITALS — BP 118/74 | HR 95 | Temp 97.2°F | Ht 68.0 in | Wt 179.0 lb

## 2024-01-29 DIAGNOSIS — N509 Disorder of male genital organs, unspecified: Secondary | ICD-10-CM | POA: Insufficient documentation

## 2024-01-29 NOTE — Assessment & Plan Note (Signed)
Normal exam today.  Supportive care. 

## 2024-01-29 NOTE — Progress Notes (Signed)
 Subjective:  Patient ID: Steven Gallagher, male    DOB: 1976-04-11  Age: 48 y.o. MRN: [id number]  CC:   Chief Complaint  Patient presents with   Mass    Right testicle x's 1-2 weeks     HPI: 48 year old male presents with concerning area to the right side of his scrotum.  Patient reports that he has an area of concern to the right side of his scrotum.  Has been going on for about 2 weeks.  No pain.  He has had some itching.  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  He would like me to examine the area today.   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/22/2014   Anxiety state 02/11/2014   Prediabetes 07/28/2013   Gastroesophageal reflux disease without esophagitis 01/27/2013    Social Hx   Social History   Socioeconomic History   Marital status: Married    Spouse name: Not on file   Number of children: Not on file   Years of education: Not on file   Highest education level: Not on file  Occupational History   Not on file  Tobacco Use   Smoking status: Never   Smokeless tobacco: Never  Vaping Use   Vaping status: Never Used  Substance and Sexual Activity   Alcohol use: No   Drug use: No   Sexual activity: Yes    Comment: Married, same male partner  Other Topics Concern   Not on file  Social History Narrative   Not on file   Social Drivers of Health   Financial Resource Strain: Not on file  Food Insecurity: Not on file  Transportation Needs: Not on file  Physical Activity: Not  on file  Stress: Not on file  Social Connections: Not on file    Review of Systems Per HPI  Objective:  BP 118/74   Pulse 95   Temp (!) 97.2 F (36.2 C)   Ht 5\' 8"  (1.727 m)   Wt 179 lb (81.2 kg)   SpO2 96%   BMI 27.22 kg/m      01/29/2024   10:20 AM 12/30/2023   10:36 AM 02/28/2023   11:36 AM  BP/Weight  Systolic BP 118 143 127  Diastolic BP 74 76 83  Wt. (Lbs) 179 183 175.4  BMI 27.22 kg/m2 27.83 kg/m2 26.67 kg/m2    Physical Exam Constitutional:      General: He is not in acute distress.    Appearance: Normal appearance.  HENT:     Head: Normocephalic and atraumatic.  Pulmonary:     Effort: Pulmonary effort is normal. No respiratory distress.  Genitourinary:    Comments: Normal testicles.  Normal scrotum.  No suspicious or concerning lesions. Neurological:     Mental Status: He is alert.     Lab Results  Component Value Date   WBC 5.7 12/02/2023   HGB 13.7 12/02/2023   HCT 42.9 12/02/2023   PLT 222 12/02/2023   GLUCOSE 119 (H) 12/02/2023   CHOL 179 12/02/2023  TRIG 94 12/02/2023   HDL 55 12/02/2023   LDLCALC 107 (H) 12/02/2023   ALT 18 12/02/2023   AST 18 12/02/2023   NA 141 12/02/2023   K 4.4 12/02/2023   CL 103 12/02/2023   CREATININE 1.23 12/02/2023   BUN 16 12/02/2023   CO2 24 12/02/2023   TSH 2.960 12/30/2023   PSA 1.26 05/26/2014   INR 1.0 09/03/2019   HGBA1C 6.2 (H) 12/02/2023     Assessment & Plan:  Scrotal skin lesion Assessment & Plan: Normal exam today.  Supportive care.     Follow-up:  Return if symptoms worsen or fail to improve.  Kathleen Papa DO [facility]

## 2024-01-29 NOTE — Patient Instructions (Signed)
 Normal exam. No concerning findings.  Take care  Dr. Debrah Fan

## 2024-01-30 ENCOUNTER — Other Ambulatory Visit: Payer: Self-pay | Admitting: Family Medicine

## 2024-02-22 ENCOUNTER — Telehealth: Admitting: Nurse Practitioner

## 2024-02-22 DIAGNOSIS — J069 Acute upper respiratory infection, unspecified: Secondary | ICD-10-CM | POA: Diagnosis not present

## 2024-02-23 ENCOUNTER — Other Ambulatory Visit: Payer: Self-pay | Admitting: Nurse Practitioner

## 2024-02-23 DIAGNOSIS — J069 Acute upper respiratory infection, unspecified: Secondary | ICD-10-CM

## 2024-02-23 MED ORDER — ALBUTEROL SULFATE HFA 108 (90 BASE) MCG/ACT IN AERS: 1.0000 | INHALATION_SPRAY | Freq: Four times a day (QID) | RESPIRATORY_TRACT | 0 refills | Status: AC | PRN

## 2024-02-23 MED ORDER — PROMETHAZINE-DM 6.25-15 MG/5ML PO SYRP: 5.0000 mL | ORAL_SOLUTION | Freq: Four times a day (QID) | ORAL | 0 refills | Status: DC | PRN

## 2024-02-23 NOTE — Progress Notes (Signed)
 I have spent 5 minutes in review of e-visit questionnaire, review and updating patient chart, medical decision making and response to patient.   Claiborne Rigg, NP

## 2024-02-23 NOTE — Progress Notes (Signed)
 E-Visit for Cough   We are sorry that you are not feeling well.  Here is how we plan to help!  Based on your presentation I believe you most likely have A cough due to a virus.  This is called viral bronchitis and is best treated by rest, plenty of fluids and control of the cough.  You may use Ibuprofen or Tylenol  as directed to help your symptoms.     In addition you may use a prescription cough syrup that has been sent to the pharmacy.    From your responses in the eVisit questionnaire you describe inflammation in the upper respiratory tract which is causing a significant cough.  This is commonly called Bronchitis and has four common causes:   Allergies Viral Infections Acid Reflux Bacterial Infection Allergies, viruses and acid reflux are treated by controlling symptoms or eliminating the cause. An example might be a cough caused by taking certain blood pressure medications. You stop the cough by changing the medication. Another example might be a cough caused by acid reflux. Controlling the reflux helps control the cough.  USE OF BRONCHODILATOR ("RESCUE") INHALERS: There is a risk from using your bronchodilator too frequently.  The risk is that over-reliance on a medication which only relaxes the muscles surrounding the breathing tubes can reduce the effectiveness of medications prescribed to reduce swelling and congestion of the tubes themselves.  Although you feel brief relief from the bronchodilator inhaler, your asthma may actually be worsening with the tubes becoming more swollen and filled with mucus.  This can delay other crucial treatments, such as oral steroid medications. If you need to use a bronchodilator inhaler daily, several times per day, you should discuss this with your provider.  There are probably better treatments that could be used to keep your asthma under control.     HOME CARE Only take medications as instructed by your medical team. Complete the entire course of an  antibiotic. Drink plenty of fluids and get plenty of rest. Avoid close contacts especially the very young and the elderly Cover your mouth if you cough or cough into your sleeve. Always remember to wash your hands A steam or ultrasonic humidifier can help congestion.   GET HELP RIGHT AWAY IF: You develop worsening fever. You become short of breath You cough up blood. Your symptoms persist after you have completed your treatment plan MAKE SURE YOU  Understand these instructions. Will watch your condition. Will get help right away if you are not doing well or get worse.    Thank you for choosing an e-visit.  Your e-visit answers were reviewed by a board certified advanced clinical practitioner to complete your personal care plan. Depending upon the condition, your plan could have included both over the counter or prescription medications.  Please review your pharmacy choice. Make sure the pharmacy is open so you can pick up prescription now. If there is a problem, you may contact your provider through [employer] and have the prescription routed to another pharmacy.  Your safety is important to us . If you have drug allergies check your prescription carefully.   For the next 24 hours you can use MyChart to ask questions about today's visit, request a non-urgent call back, or ask for a work or school excuse. You will get an email in the next two days asking about your experience. I hope that your e-visit has been valuable and will speed your recovery.

## 2024-03-18 ENCOUNTER — Other Ambulatory Visit: Payer: Self-pay | Admitting: Nurse Practitioner

## 2024-03-28 ENCOUNTER — Other Ambulatory Visit: Payer: Self-pay | Admitting: Family Medicine

## 2024-03-30 ENCOUNTER — Other Ambulatory Visit: Payer: Self-pay

## 2024-03-30 MED ORDER — VALACYCLOVIR HCL 500 MG PO TABS: 500.0000 mg | ORAL_TABLET | Freq: Every day | ORAL | 3 refills | Status: DC

## 2024-04-12 ENCOUNTER — Encounter

## 2024-04-12 ENCOUNTER — Telehealth

## 2024-04-12 DIAGNOSIS — T63461A Toxic effect of venom of wasps, accidental (unintentional), initial encounter: Secondary | ICD-10-CM | POA: Diagnosis not present

## 2024-04-12 DIAGNOSIS — L299 Pruritus, unspecified: Secondary | ICD-10-CM

## 2024-04-13 MED ORDER — PREDNISONE 20 MG PO TABS: 40.0000 mg | ORAL_TABLET | Freq: Every day | ORAL | 0 refills | Status: DC

## 2024-04-13 NOTE — Progress Notes (Signed)
 E-Visit for Insect Sting  Thank you for describing the insect sting for us .  Here is how we plan to help!  Based on the information you have shared it looks like you have: An uncomplicated insect sting that just occurred and can be closely followed using the instructions in your care plan.  The 2 greatest risks from insect stings are allergic reaction, which can be fatal in some people and infection, which is more common and less serious.  Bees, wasps, yellow jackets, and hornets belong to a class of insects called Hymenoptera.  Most insect stings cause only minor discomfort.  Stings can happen anywhere on the body and can be painful.  Most stings are from honey bees or yellow jackets.  Fire ants can sting multiple times.  The sites of the stings are more likely to become infected.    Based on your information I have:, Provided a home care guide for insect stings and instructions on when to call for help., and I have sent in prednisone  40 mg by mouth daily for 5 days to the pharmacy you selected.  Please make sure that you selected a pharmacy that is open now.  What can be used to prevent Insect Stings?  Insect repellant with at least 20% DEET.  Wearing long pants and shirts with socks and shoes.  Wear dark or drab-colored clothes rather than bright colors.  Avoid using perfumes and hair sprays; these attract insects.  HOME CARE ADVICE:  1. Stinger removal: The stinger looks like a tiny black dot in the sting. Use a fingernail, credit card edge, or knife-edge to scrape it off.  Don't pull it out because it squeezes out more venom. If the stinger is below the skin surface, leave it alone.  It will be shed with normal skin healing. 2. Use cold compresses to the area of the sting for 10-20 minutes.  You may repeat this as needed to relieve symptoms of pain and swelling. 3.  For pain relief, take acetominophen 650 mg 4-6 hours as needed or ibuprofen 400 mg every 6-8 hours as needed or  naproxen  250-500 mg every 12 hours as needed. 4.  You can also use hydrocortisone  cream 0.5% or 1% up to 4 times daily as needed for itching. 5.  If the sting becomes very itchy, take Benadryl 25-50 mg, follow directions on box. 6.  Wash the area 2-3 times daily with antibacterial soap and warm water. 7. Call your Doctor if: Fever, a severe headache, or rash occur in the next 2 weeks. Sting area begins to look infected. Redness and swelling worsens after home treatment. Your current symptoms become worse.    MAKE SURE YOU:  Understand these instructions. Will watch your condition. Will get help right away if you are not doing well or get worse.  Thank you for choosing an e-visit.  Your e-visit answers were reviewed by a board certified advanced clinical practitioner to complete your personal care plan. Depending upon the condition, your plan could have included both over the counter or prescription medications.  Please review your pharmacy choice. Make sure the pharmacy is open so you can pick up prescription now. If there is a problem, you may contact your provider through [employer] and have the prescription routed to another pharmacy.  Your safety is important to us . If you have drug allergies check your prescription carefully.   For the next 24 hours you can use MyChart to ask questions about today's visit, request a  non-urgent call back, or ask for a work or school excuse. You will get an email in the next two days asking about your experience. I hope that your e-visit has been valuable and will speed your recovery.     I have spent 5 minutes in review of e-visit questionnaire, review and updating patient chart, medical decision making and response to patient.   Delon CHRISTELLA Dickinson, PA-C

## 2024-04-22 ENCOUNTER — Encounter: Payer: Self-pay | Admitting: Family Medicine

## 2024-04-22 ENCOUNTER — Other Ambulatory Visit: Payer: Self-pay | Admitting: Gastroenterology

## 2024-04-23 ENCOUNTER — Telehealth: Payer: Self-pay | Admitting: Family Medicine

## 2024-04-23 NOTE — Telephone Encounter (Signed)
 Left message to return call

## 2024-04-23 NOTE — Telephone Encounter (Signed)
 Called patient, phone rang and rang no answer, no voicemail

## 2024-04-23 NOTE — Telephone Encounter (Signed)
 Nurses Please see message below Patient sent a MyChart reply Please talk with him let him know we want to help Also it would be important to ask him if he feels like he is going to hurt himself? If he is going to hurt himself it is recommended to refer him to emergency department behavioral health team or at the very least with us  today  Otherwise I would recommend office visit with myself I am willing to work him in 4:00 If he cannot come today (and not suicidal) you could give him an appointment with me on Monday or Tuesday I am willing to work him in in the afternoon I am trying to be open to adding 1 to my schedule thank you  Patient needs to be seen   Yes some depression,and yes I take 100mg  every day .mostly anxious and nervous talking with people then sweating excessive.just feel sad kids autistic some times I think I b better of dead.having to [place] with people trying to get out of work

## 2024-04-24 ENCOUNTER — Encounter: Payer: Self-pay | Admitting: Family Medicine

## 2024-05-21 ENCOUNTER — Ambulatory Visit: Admitting: Family Medicine

## 2024-05-21 ENCOUNTER — Encounter: Payer: Self-pay | Admitting: Family Medicine

## 2024-05-21 VITALS — BP 127/83 | HR 84 | Temp 97.9°F | Ht 68.0 in | Wt 178.0 lb

## 2024-05-21 DIAGNOSIS — G4482 Headache associated with sexual activity: Secondary | ICD-10-CM | POA: Diagnosis not present

## 2024-05-21 DIAGNOSIS — Z23 Encounter for immunization: Secondary | ICD-10-CM

## 2024-05-21 DIAGNOSIS — R7303 Prediabetes: Secondary | ICD-10-CM | POA: Diagnosis not present

## 2024-05-21 DIAGNOSIS — I10 Essential (primary) hypertension: Secondary | ICD-10-CM

## 2024-05-21 DIAGNOSIS — E7849 Other hyperlipidemia: Secondary | ICD-10-CM

## 2024-05-21 MED ORDER — ROSUVASTATIN CALCIUM 10 MG PO TABS: 10.0000 mg | ORAL_TABLET | Freq: Every day | ORAL | 1 refills | Status: AC

## 2024-05-21 MED ORDER — SERTRALINE HCL 100 MG PO TABS: ORAL_TABLET | ORAL | 1 refills | Status: AC

## 2024-05-21 MED ORDER — AMLODIPINE BESYLATE 5 MG PO TABS: 5.0000 mg | ORAL_TABLET | Freq: Every day | ORAL | 1 refills | Status: AC

## 2024-05-21 MED ORDER — PANTOPRAZOLE SODIUM 40 MG PO TBEC: 40.0000 mg | DELAYED_RELEASE_TABLET | Freq: Every day | ORAL | 1 refills | Status: AC

## 2024-05-21 NOTE — Patient Instructions (Signed)
8

## 2024-05-21 NOTE — Progress Notes (Signed)
   Subjective:    Patient ID: Steven Gallagher, male    DOB: [birth year], 48 y.o.   MRN: [id number]  HPI Htn, gerd, hyperlipidemia Stress levels are moderate He has 2 children who are autistic He tries the best he can with the less stressful He does state he takes his blood pressure medicine regular basis readings have been good at home minimizing salt in diet stays physically active Anxiety stress has been heavy but denies being depressed but does state that he would like to go up on the sertraline  to see if that would help Not suicidal Reflux under good control takes medicine regular basis Patient here for follow-up regarding cholesterol.    Patient relates taking medication on a regular basis Denies problems with medication Importance of dietary measures discussed Regular lab work regarding lipid and liver was checked and if needing additional labs was appropriately ordered  Relates a severe headache top of his head that occurs with relations This been going on over the past couple months Happens almost every single time.  Bad enough to cause him to lay down Usually lasts 15 to 30 minutes No double vision or vomiting No headaches outside of that  Review of Systems     Objective:   Physical Exam General-in no acute distress Eyes-no discharge Lungs-respiratory rate normal, CTA CV-no murmurs,RRR Extremities skin warm dry no edema Neuro grossly normal Behavior normal, alert        Assessment & Plan:  1. Immunization due Today - Flu vaccine trivalent PF, 6mos and older(Flulaval,Afluria,Fluarix,Fluzone)  2. Headache associated with sexual activity (Primary) We will pursue MRI to rule out the possibility of aneurysm.  Likelihood of aneurysm is low. Based on protocols given that he has never had headaches he has severe headaches with relations there is a increased risk of aneurysms or even subarachnoid issues therefore recommend MRI of the brain with and without contrast 3.  Primary hypertension Blood pressure decent control regular activity recommended to continue medication  4. Prediabetes Previous labs look good healthy diet regular activity  5. Essential hypertension BP good  6. Other hyperlipidemia Goal is keep LDL below 100 if possible  Stress related issues we will go ahead and increase sertraline  to 150 mg every day patient will send us  update in 3 to 4 weeks how that is doing for him and reach out sooner if any problems  Patient will do his lab work before his next visit

## 2024-05-25 ENCOUNTER — Other Ambulatory Visit: Payer: Self-pay

## 2024-05-25 DIAGNOSIS — G4482 Headache associated with sexual activity: Secondary | ICD-10-CM

## 2024-05-25 DIAGNOSIS — E7849 Other hyperlipidemia: Secondary | ICD-10-CM

## 2024-05-25 DIAGNOSIS — R7303 Prediabetes: Secondary | ICD-10-CM

## 2024-05-25 DIAGNOSIS — Z79899 Other long term (current) drug therapy: Secondary | ICD-10-CM

## 2024-05-25 DIAGNOSIS — Z125 Encounter for screening for malignant neoplasm of prostate: Secondary | ICD-10-CM

## 2024-05-25 DIAGNOSIS — I10 Essential (primary) hypertension: Secondary | ICD-10-CM

## 2024-06-02 ENCOUNTER — Ambulatory Visit (HOSPITAL_COMMUNITY)
Admission: RE | Admit: 2024-06-02 | Discharge: 2024-06-02 | Disposition: A | Discharge status: Home / Self Care | Source: Ambulatory Visit | Attending: Family Medicine | Admitting: Family Medicine

## 2024-06-02 DIAGNOSIS — G4482 Headache associated with sexual activity: Secondary | ICD-10-CM | POA: Insufficient documentation

## 2024-06-02 MED ORDER — GADOBUTROL 1 MMOL/ML IV SOLN
8.0000 mL | Freq: Once | INTRAVENOUS | Status: AC | PRN
  Administered 2024-06-02: 8 mL via INTRAVENOUS

## 2024-06-08 ENCOUNTER — Telehealth: Payer: Self-pay | Admitting: Family Medicine

## 2024-06-08 ENCOUNTER — Ambulatory Visit: Payer: Self-pay | Admitting: Family Medicine

## 2024-06-08 ENCOUNTER — Other Ambulatory Visit: Payer: Self-pay

## 2024-06-08 DIAGNOSIS — R9089 Other abnormal findings on diagnostic imaging of central nervous system: Secondary | ICD-10-CM

## 2024-06-08 NOTE — Telephone Encounter (Signed)
 Nurses-please see MyChart message-please set up same-day appointment with me for Tuesday or Wednesday or next week If possible for this week patient feeling anxious about his test results thank you  Diagnosis microhemorrhages on MRI

## 2024-06-08 NOTE — Telephone Encounter (Signed)
 Appointment scheduled.  Left message informing patient.

## 2024-06-09 ENCOUNTER — Encounter: Payer: Self-pay | Admitting: Family Medicine

## 2024-06-09 ENCOUNTER — Ambulatory Visit (INDEPENDENT_AMBULATORY_CARE_PROVIDER_SITE_OTHER): Admitting: Family Medicine

## 2024-06-09 VITALS — BP 114/88 | HR 98 | Temp 98.1°F | Ht 68.0 in | Wt 175.0 lb

## 2024-06-09 DIAGNOSIS — R400 Somnolence: Secondary | ICD-10-CM | POA: Diagnosis not present

## 2024-06-09 DIAGNOSIS — R9089 Other abnormal findings on diagnostic imaging of central nervous system: Secondary | ICD-10-CM

## 2024-06-09 DIAGNOSIS — I10 Essential (primary) hypertension: Secondary | ICD-10-CM | POA: Diagnosis not present

## 2024-06-09 DIAGNOSIS — G4482 Headache associated with sexual activity: Secondary | ICD-10-CM | POA: Diagnosis not present

## 2024-06-09 NOTE — Progress Notes (Signed)
   Subjective:    Patient ID: Steven Gallagher, male    DOB: 02-13-1976, 48 y.o.   MRN: [id number]  HPI  Brain MRI discussion  Very nice patient Here today because he had a recent MRI which showed microhemorrhages chronic not acute in the brain It should be noted that he does have headache pains with intercourse this started up within the past 2 months He does have a history of high blood pressure He denies any unilateral numbness or weakness denies any focal findings Patient has not had any injuries double vision vomiting neck pain etc. It should be noted that patient today relates that he does have some intermittent daytime somnolence and finds himself feeling sleepy at times and not well rested he states he does snore and states his wife told him he snores He is not dramatically overweight but his BMI is 26.6 He denies any drowsiness with driving He does have a history of hypertension MRI reviewed with patient in detail Review of Systems     Objective:   Physical Exam  Neurologic grossly normal lungs are clear respiratory rate normal heart regular pulse normal      Assessment & Plan:  Abnormal MRI Findings of chronic microhemorrhages Symptoms of sleep apnea Hypertension with subpar control I am concerned about his diastolic mean 88 here at the office previously it looked better Patient feels it is could be related to all of this going on He is willing to check his blood pressure multiple times over the course of the next 7 to 10 days and send us  an update We may have to add additional measures to get his diastolic more likely in the 70s Neurology consultation for sleep study and evaluation of abnormal MRI indicated

## 2024-06-11 ENCOUNTER — Encounter: Payer: Self-pay | Admitting: Family Medicine

## 2024-06-25 ENCOUNTER — Encounter: Payer: Self-pay | Admitting: Family Medicine

## 2024-06-25 ENCOUNTER — Ambulatory Visit: Admitting: Neuroradiology

## 2024-06-25 ENCOUNTER — Encounter: Payer: Self-pay | Admitting: Neuroradiology

## 2024-06-25 VITALS — BP 123/83 | HR 84 | Temp 97.7°F | Ht 68.0 in | Wt 178.8 lb

## 2024-06-25 DIAGNOSIS — G4482 Headache associated with sexual activity: Secondary | ICD-10-CM

## 2024-06-25 DIAGNOSIS — I10 Essential (primary) hypertension: Secondary | ICD-10-CM

## 2024-06-25 DIAGNOSIS — R930 Abnormal findings on diagnostic imaging of skull and head, not elsewhere classified: Secondary | ICD-10-CM | POA: Diagnosis not present

## 2024-06-25 DIAGNOSIS — R400 Somnolence: Secondary | ICD-10-CM

## 2024-06-25 NOTE — Progress Notes (Signed)
 Chief Complaint: Patient was seen in consultation today for  Chief Complaint  Patient presents with   New Patient (Initial Visit)    *Abnormal finding on MRI/ imaging is in the chart LM    at the request of Luking,Scott A  Referring Physician(s): Luking,Scott A  Steven Gallagher is a 48 y.o. 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 Brain MRI essentially negative with the exception of a couple of small chronic microhemorrhages in the subcortical region. - Reassured benign nature of condition. - No immediate intervention required. - Consider MR angiogram if absolute certainty desired, though not recommended.  Chronic cerebral microbleeds Brain MRI showed minimal chronic microbleeds, not concerning for significant pathology. - Continue monitoring blood pressure control.  Essential hypertension Blood pressure well-controlled on amlodipine , recent reading 123/86 mmHg. - Continue current antihypertensive regimen.     Discussed the use of AI scribe software for clinical note transcription with the patient, who gave verbal consent to proceed.  History of Present Illness Steven Gallagher is a 48 year old male with hypertension who presents with severe postcoital headaches. He was referred for evaluation of severe postcoital headaches and an abnormal brain MRI.  He experiences sharp headaches immediately after orgasm, described as 'a real sharp pain after the fact' that lasts for a couple of minutes before subsiding. These headaches have been occurring for the past couple of months, with an estimated frequency of 10 to 20 episodes.  A brain MRI on June 02, 2024, showed a few chronic microbleeds but was otherwise negative.  No MRI angiogram.  He has a history of  hypertension, which is currently well-controlled with amlodipine . In the past, he experienced frequent migraines and used to take Excedrin Migraine for relief, but has since stopped due to concerns about potential overuse and caffeine dependency.  He mentions a history of sleep apnea concerns, although this was not evaluated during the current visit.    Past Medical History:  Diagnosis Date   Dizziness    Elevated blood pressure    Family history of colon cancer 03/05/2022   Brother had colon cancer at age 66   Family history of prostate cancer in father 03/05/2022   Dad had prostate cancer late 50s   GERD (gastroesophageal reflux disease)    Hyperglycemia    Hyperlipidemia    Hyperlipidemia 12/17/2019   Hypertension     Past Surgical History:  Procedure Laterality Date   BACTERIAL OVERGROWTH TEST N/A 04/22/2015   Procedure: BACTERIAL OVERGROWTH TEST;  Surgeon: Lamar CHRISTELLA Hollingshead, MD;  Location: [facility];  Service: Endoscopy;  Laterality: N/A;  0700   COLONOSCOPY N/A 07/03/2017   dr. Hollingshead: small internal hemorrhoids, otherwise normal   COLONOSCOPY WITH PROPOFOL  N/A 06/04/2022   one 8 mm polyp in sigmoid, non-bleeding internal hemorrhoids. Tubular adenoma   ESOPHAGOGASTRODUODENOSCOPY N/A 07/03/2017   Dr. Hollingshead: normal esophagus s/p dilation, small hiatal hernia, normal duodenum   FLEXIBLE SIGMOIDOSCOPY N/A 04/03/2021   mild internal hemorrhoids.   MALONEY DILATION N/A 07/03/2017   Procedure: AGAPITO DILATION;  Surgeon: Hollingshead Lamar CHRISTELLA, MD;  Location: [facility];  Service: Endoscopy;  Laterality: N/A;   POLYPECTOMY  06/04/2022   Procedure: POLYPECTOMY;  Surgeon: Hollingshead Lamar CHRISTELLA, MD;  Location: [facility];  Service: Endoscopy;;    Allergies:  Patient has no known allergies.  Medications: Prior to Admission medications   Medication Sig Start Date End Date Taking? Authorizing Provider  albuterol  (VENTOLIN  HFA) 108 (90 Base) MCG/ACT inhaler Inhale 1-2 puffs into the lungs  every 6 (six) hours as needed for wheezing or shortness of breath. 02/23/24  Yes Fleming, Zelda W, NP  amLODipine  (NORVASC ) 5 MG tablet Take 1 tablet (5 mg total) by mouth daily. 05/21/24  Yes Luking, Glendia LABOR, MD  clotrimazole  (LOTRIMIN ) 1 % cream Apply 1 Application topically 2 (two) times daily. 08/05/22  Yes Hawks, Christy A, FNP  fluticasone  (FLONASE ) 50 MCG/ACT nasal spray Place 2 sprays into both nostrils daily. 11/27/23  Yes Vivienne Delon HERO, PA-C  ketoconazole  (NIZORAL ) 2 % cream Apply 1 Application topically 2 (two) times daily. Prn jock itch or redness in the private area 12/30/23  Yes Mauro Elveria BROCKS, NP  pantoprazole  (PROTONIX ) 40 MG tablet Take 1 tablet (40 mg total) by mouth daily. 05/21/24  Yes Luking, Glendia LABOR, MD  rosuvastatin  (CRESTOR ) 10 MG tablet Take 1 tablet (10 mg total) by mouth daily. 05/21/24  Yes Alphonsa Glendia LABOR, MD  sertraline  (ZOLOFT ) 100 MG tablet One and on half daily 05/21/24  Yes Luking, Glendia LABOR, MD  valACYclovir  (VALTREX ) 500 MG tablet Take 1 tablet (500 mg total) by mouth daily. 03/30/24  Yes Alphonsa Glendia LABOR, MD     Family History  Problem Relation Age of Onset   Hypertension Mother    Hypertension Father    Prostate cancer Father    Hypertension Brother    Colon cancer Brother 14   Colon polyps Neg Hx     Social History   Socioeconomic History   Marital status: Married    Spouse name: Not on file   Number of children: Not on file   Years of education: Not on file   Highest education level: Not on file  Occupational History   Not on file  Tobacco Use   Smoking status: Never   Smokeless tobacco: Never  Vaping Use   Vaping status: Never Used  Substance and Sexual Activity   Alcohol use: No   Drug use: No   Sexual activity: Yes    Comment: Married, same male partner  Other Topics Concern   Not on file  Social History Narrative   Not on file   Social Drivers of Health   Financial Resource Strain: Not on file  Food Insecurity: Not on file   Transportation Needs: Not on file  Physical Activity: Not on file  Stress: Not on file  Social Connections: Not on file      Vital Signs:  BP 123/83 (BP Location: Left Arm, Patient Position: Sitting, Cuff Size: Normal)   Pulse 84   Temp 97.7 F (36.5 C) (Oral)   Ht 5' 8 (1.727 m)   Wt 178 lb 12.8 oz (81.1 kg)   BMI 27.19 kg/m    Imaging:  Results RADIOLOGY Brain MRI: Few chronic microbleeds, otherwise negative (06/02/2024)  Labs:  CBC: Recent Labs    12/02/23 0809  WBC 5.7  HGB 13.7  HCT 42.9  PLT 222    COAGS: No results for input(s): INR, APTT in the last 8760 hours.  BMP: Recent Labs    12/02/23 0809  NA 141  K 4.4  CL 103  CO2 24  GLUCOSE 119*  BUN 16  CALCIUM  9.5  CREATININE 1.23    LIVER FUNCTION TESTS: Recent Labs    12/02/23 0809  BILITOT 0.3  AST 18  ALT 18  ALKPHOS 78  PROT 7.0  ALBUMIN 4.6      Electronically Signed: Nancyann LULLA Burns  06/25/2024, 2:14 PM

## 2024-06-30 NOTE — Telephone Encounter (Signed)
 Nurses Please assist Steven Gallagher by putting in a new referral for Dr.Athar For sleep apnea evaluation-his previous office visit has documentation regarding the symptoms  Then please send DME notification that the referral has been placed and the specialist office will reach out to him to set up the appointment  Thanks-Dr. Glendia

## 2024-07-01 ENCOUNTER — Ambulatory Visit: Admitting: Family Medicine

## 2024-07-15 ENCOUNTER — Encounter: Payer: Self-pay | Admitting: Family Medicine

## 2024-07-15 ENCOUNTER — Encounter: Payer: Self-pay | Admitting: Neurology

## 2024-07-15 ENCOUNTER — Ambulatory Visit (INDEPENDENT_AMBULATORY_CARE_PROVIDER_SITE_OTHER): Admitting: Neurology

## 2024-07-15 VITALS — BP 118/79 | HR 88 | Ht 68.0 in | Wt 181.6 lb

## 2024-07-15 DIAGNOSIS — Z9189 Other specified personal risk factors, not elsewhere classified: Secondary | ICD-10-CM

## 2024-07-15 DIAGNOSIS — R0683 Snoring: Secondary | ICD-10-CM

## 2024-07-15 DIAGNOSIS — G4719 Other hypersomnia: Secondary | ICD-10-CM

## 2024-07-15 DIAGNOSIS — R519 Headache, unspecified: Secondary | ICD-10-CM | POA: Diagnosis not present

## 2024-07-15 DIAGNOSIS — E663 Overweight: Secondary | ICD-10-CM

## 2024-07-15 NOTE — Progress Notes (Signed)
 Subjective:    Patient ID: Steven Gallagher is a 48 y.o. male.  HPI    True Mar, MD, PhD [facility] [address], [address] [address] [place], [place] [address]  Dear Dr. Alphonsa,   I saw your patient, Steven Gallagher, upon your kind request in my sleep clinic today for initial consultation of his sleep disorder, in particular, concern for underlying obstructive sleep apnea.  The patient is unaccompanied today.  As you know, Steven Gallagher is a 48 year old male with an underlying medical history of reflux disease, hypertension, cerebral microhemorrhage (for which he recently saw neurosurgery), recurrent headaches, hyperlipidemia, history of hyperglycemia, and overweight state, who reports snoring and excessive daytime somnolence.  His Epworth sleepiness score is 6 out of 24, fatigue severity score is 18 out of 63.  He has intermittent somnolence.  He works rotating shifts, either 824 or 4-12.  He is going to go to 12-hour shifts starting next week from 6 PM to 6 AM.  Currently he goes to bed generally between 9 and 11 depending on his shift and rise time is between 6:45 AM and 10, again depending on his shift.  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  He is a non-smoker and does not drink any alcohol, limits his caffeine to 1 cup of coffee per day.  Snoring can be loud.  He has woken up occasionally with a headache.  He denies nightly nocturia, no family history of sleep apnea.  He lives with his family including wife and 3 young children, ages 61 (a boy) and twin 23-year-old boys.  They have 3 cats in the household. I reviewed your office note from 06/09/2024.   His Past Medical History Is Significant For: Past Medical History:  Diagnosis Date   Dizziness    Elevated blood pressure    Family history of colon cancer 03/05/2022   Brother had colon cancer at age 47   Family history of prostate cancer in father 03/05/2022   Dad had prostate cancer late 50s   GERD  (gastroesophageal reflux disease)    Hyperglycemia    Hyperlipidemia    Hyperlipidemia 12/17/2019   Hypertension     His Past Surgical History Is Significant For: Past Surgical History:  Procedure Laterality Date   BACTERIAL OVERGROWTH TEST N/A 04/22/2015   Procedure: BACTERIAL OVERGROWTH TEST;  Surgeon: Lamar CHRISTELLA Hollingshead, MD;  [facility];  Service: Endoscopy;  Laterality: N/A;  0700   COLONOSCOPY N/A 07/03/2017   dr. Hollingshead: small internal hemorrhoids, otherwise normal   COLONOSCOPY WITH PROPOFOL  N/A 06/04/2022   one 8 mm polyp in sigmoid, non-bleeding internal hemorrhoids. Tubular adenoma   ESOPHAGOGASTRODUODENOSCOPY N/A 07/03/2017   Dr. Hollingshead: normal esophagus s/p dilation, small hiatal hernia, normal duodenum   FLEXIBLE SIGMOIDOSCOPY N/A 04/03/2021   mild internal hemorrhoids.   MALONEY DILATION N/A 07/03/2017   Procedure: AGAPITO DILATION;  Surgeon: Hollingshead Lamar CHRISTELLA, MD;  Location: [facility];  Service: Endoscopy;  Laterality: N/A;   POLYPECTOMY  06/04/2022   Procedure: POLYPECTOMY;  Surgeon: Hollingshead Lamar CHRISTELLA, MD;  [facility]: [facility];  Service: Endoscopy;;    His Family History Is Significant For: Family History  Problem Relation Age of Onset   Hypertension Mother    Hypertension Father    Prostate cancer Father    Hypertension Brother    Sleep apnea Brother    Colon cancer Brother 76   Colon polyps Neg Hx  His Social History Is Significant For: Social History   Socioeconomic History   Marital status: Married    Spouse name: Not on file   Number of children: Not on file   Years of education: Not on file   Highest education level: Not on file  Occupational History   Not on file  Tobacco Use   Smoking status: Never   Smokeless tobacco: Never  Vaping Use   Vaping status: Never Used  Substance and Sexual Activity   Alcohol use: No   Drug use: No   Sexual activity: Yes    Comment: Married, same male partner  Other Topics Concern   Not on  file  Social History Narrative   Pt lives with family    Pt works    Social Drivers of Corporate Investment Banker Strain: Not on Ship Broker Insecurity: Not on file  Transportation Needs: Not on file  Physical Activity: Not on file  Stress: Not on file  Social Connections: Not on file    His Allergies Are:  No Known Allergies:   His Current Medications Are:  Outpatient Encounter Medications as of 07/15/2024  Medication Sig   albuterol  (VENTOLIN  HFA) 108 (90 Base) MCG/ACT inhaler Inhale 1-2 puffs into the lungs every 6 (six) hours as needed for wheezing or shortness of breath.   amLODipine  (NORVASC ) 5 MG tablet Take 1 tablet (5 mg total) by mouth daily.   fluticasone  (FLONASE ) 50 MCG/ACT nasal spray Place 2 sprays into both nostrils daily.   ketoconazole  (NIZORAL ) 2 % cream Apply 1 Application topically 2 (two) times daily. Prn jock itch or redness in the private area (Patient taking differently: Apply 1 Application topically as needed. Prn jock itch or redness in the private area)   pantoprazole  (PROTONIX ) 40 MG tablet Take 1 tablet (40 mg total) by mouth daily.   rosuvastatin  (CRESTOR ) 10 MG tablet Take 1 tablet (10 mg total) by mouth daily.   sertraline  (ZOLOFT ) 100 MG tablet One and on half daily   valACYclovir  (VALTREX ) 500 MG tablet Take 1 tablet (500 mg total) by mouth daily.   clotrimazole  (LOTRIMIN ) 1 % cream Apply 1 Application topically 2 (two) times daily.   No facility-administered encounter medications on file as of 07/15/2024.  :   Review of Systems:  Out of a complete 14 point review of systems, all are reviewed and negative with the exception of these symptoms as listed below:   Review of Systems  Objective:  Neurological Exam  Physical Exam Physical Examination:   Vitals:   07/15/24 0923  BP: 118/79  Pulse: 88    General Examination: The patient is a very pleasant 48 y.o. male in no acute distress. He appears well-developed and well-nourished and  well groomed.   HEENT: Normocephalic, atraumatic, pupils are equal, round and reactive to light, extraocular tracking is good without limitation to gaze excursion or nystagmus noted. No photophobia. Corrective eye glasses in place. Hearing is grossly intact.  Face is symmetric with normal facial animation. Speech is clear without dysarthria. There is no hypophonia. There is no lip, neck/head, jaw or voice tremor. Neck is supple with full range of passive and active motion. There are no carotid bruits on auscultation.  Airway/Oropharynx exam reveals: mild mouth dryness, adequate dental hygiene with several missing teeth, and moderate airway crowding, due to small airway entry, wider tongue, larger uvula, tonsillar size about 1+ bilaterally.  Tongue protrudes centrally and palate elevates symmetrically, Mallampati class II, neck circumference  16 7/8 inches, minimal overbite noted.  Chest: Clear to auscultation without wheezing, rhonchi or crackles noted.  Heart: S1+S2+0, regular and normal without murmurs, rubs or gallops noted.   Abdomen: Soft, non-tender and non-distended.  Extremities: There is no pitting edema in the distal lower extremities bilaterally.   Skin: Warm and dry without trophic changes noted.   Musculoskeletal: exam reveals no obvious joint deformities.   Neurologically:  Mental status: The patient is awake, alert and oriented in all 4 spheres. His immediate and remote memory, attention, language skills and fund of knowledge are appropriate. There is no evidence of aphasia, agnosia, apraxia or anomia. Speech is clear with normal prosody and enunciation. Thought process is linear. Mood is normal and affect is normal.  Cranial nerves II - XII are as described above under HEENT exam.  Motor exam: Normal bulk, strength and tone is noted. There is no obvious action or resting tremor.  Fine motor skills and coordination: Intact grossly.  Cerebellar testing: No dysmetria or intention  tremor. There is no truncal or gait ataxia.  Sensory exam: intact to light touch in the upper and lower extremities.  Gait, station and balance: He stands easily. No veering to one side is noted. No leaning to one side is noted. Posture is age-appropriate and stance is narrow based. Gait shows normal stride length and normal pace. No problems turning are noted.   Assessment and Plan:  In summary, Steven Gallagher is a very pleasant 48 y.o.-year old male with an underlying medical history of reflux disease, hypertension, cerebral microhemorrhage (for which he recently saw neurosurgery), recurrent headaches, hyperlipidemia, history of hyperglycemia, and overweight state, whose history and physical exam are concerning for sleep disordered breathing, particularly obstructive sleep apnea (OSA). A laboratory attended sleep study is typically considered gold standard for evaluation of sleep disordered breathing.  Since he is going to start third shift work, we mutually agreed to try to pursue a daytime sleep study through our sleep lab. I had a long chat with the patient about my findings and the diagnosis of sleep apnea, particularly OSA, its prognosis and treatment options. We talked about medical/conservative treatments, surgical interventions and non-pharmacological approaches for symptom control. I explained, in particular, the risks and ramifications of untreated moderate to severe OSA, especially with respect to developing cardiovascular disease down the road, including congestive heart failure (CHF), difficult to treat hypertension, cardiac arrhythmias (particularly A-fib), neurovascular complications including TIA, stroke and dementia. Even type 2 diabetes has, in part, been linked to untreated OSA. Symptoms of untreated OSA may include (but may not be limited to) daytime sleepiness, nocturia (i.e. frequent nighttime urination), memory problems, mood irritability and suboptimally controlled or worsening mood  disorder such as depression and/or anxiety, lack of energy, lack of motivation, physical discomfort, as well as recurrent headaches, especially morning or nocturnal headaches. We talked about the importance of maintaining a healthy lifestyle and striving for healthy weight. I recommended a sleep study at this time. I outlined the differences between a laboratory attended sleep study which is considered more comprehensive and accurate over the option of a home sleep test (HST); the latter may lead to underestimation of sleep disordered breathing in some instances and does not help with diagnosing upper airway resistance syndrome and is not accurate enough to diagnose primary central sleep apnea typically. I outlined possible surgical and non-surgical treatment options of OSA, including the use of a positive airway pressure (PAP) device (i.e. CPAP, AutoPAP/APAP or BiPAP in certain circumstances),  a custom-made dental device (aka oral appliance, which would require a referral to a specialist dentist or orthodontist typically, and is generally speaking not considered for patients with full dentures or edentulous state), upper airway surgical options, such as traditional UPPP (which is not considered a first-line treatment) or the Inspire device (hypoglossal nerve stimulator, which would involve a referral for consultation with an ENT surgeon, after careful selection, following inclusion criteria - also not first-line treatment). I explained the PAP treatment option to the patient in detail, as this is generally considered first-line treatment.  The patient indicated that he would be willing to try PAP therapy, if the need arises. I explained the importance of being compliant with PAP treatment, not only for insurance purposes but primarily to improve patient's symptoms symptoms, and for the patient's long term health benefit, including to reduce His cardiovascular risks longer-term.    We will pick up our discussion  about the next steps and treatment options after testing.  We will keep him posted as to the test results by phone call and/or MyChart messaging where possible.  We will plan to follow-up in sleep clinic accordingly as well.  I answered all his questions today and the patient was in agreement.   I encouraged him to call with any interim questions, concerns, problems or updates or email us  through MyChart.  Generally speaking, sleep test authorizations may take up to 2 weeks, sometimes less, sometimes longer, the patient is encouraged to get in touch with us  if they do not hear back from the sleep lab staff directly within the next 2 weeks.  Thank you very much for allowing me to participate in the care of this nice patient. If I can be of any further assistance to you please do not hesitate to call me at [phone].  Sincerely,   True Mar, MD, PhD

## 2024-07-15 NOTE — Patient Instructions (Signed)

## 2024-07-29 ENCOUNTER — Telehealth: Payer: Self-pay | Admitting: Neurology

## 2024-07-29 NOTE — Telephone Encounter (Signed)
 NPSG/HST [employer] no auth req for either studies. Spoke to Hunter L ref # [id number]   Sent mychart

## 2024-08-16 ENCOUNTER — Encounter: Payer: Self-pay | Admitting: Family Medicine

## 2024-09-02 ENCOUNTER — Encounter

## 2024-09-14 ENCOUNTER — Ambulatory Visit: Admitting: Neurology

## 2024-09-14 DIAGNOSIS — G4720 Circadian rhythm sleep disorder, unspecified type: Secondary | ICD-10-CM

## 2024-09-14 DIAGNOSIS — G4733 Obstructive sleep apnea (adult) (pediatric): Secondary | ICD-10-CM

## 2024-09-14 DIAGNOSIS — R0683 Snoring: Secondary | ICD-10-CM | POA: Diagnosis not present

## 2024-09-14 DIAGNOSIS — Z9189 Other specified personal risk factors, not elsewhere classified: Secondary | ICD-10-CM

## 2024-09-14 DIAGNOSIS — R519 Headache, unspecified: Secondary | ICD-10-CM

## 2024-09-14 DIAGNOSIS — E663 Overweight: Secondary | ICD-10-CM

## 2024-09-14 DIAGNOSIS — G4719 Other hypersomnia: Secondary | ICD-10-CM

## 2024-09-16 ENCOUNTER — Ambulatory Visit: Admitting: Neurology

## 2024-09-21 ENCOUNTER — Encounter: Payer: Self-pay | Admitting: Family Medicine

## 2024-09-21 ENCOUNTER — Other Ambulatory Visit: Payer: Self-pay | Admitting: Nurse Practitioner

## 2024-09-21 MED ORDER — VALACYCLOVIR HCL 500 MG PO TABS: 500.0000 mg | ORAL_TABLET | Freq: Every day | ORAL | 3 refills | Status: AC

## 2024-09-22 ENCOUNTER — Ambulatory Visit: Payer: Self-pay | Admitting: Neurology

## 2024-09-22 DIAGNOSIS — G4733 Obstructive sleep apnea (adult) (pediatric): Secondary | ICD-10-CM

## 2024-09-22 NOTE — Procedures (Signed)
 Physician Interpretation: Please see link under Procedure Tab or under Encounters tab for physician report, technical report, as well as O2 titration and/or PAP titration tables (if applicable).

## 2024-10-13 ENCOUNTER — Ambulatory Visit: Admitting: Gastroenterology

## 2024-10-20 ENCOUNTER — Telehealth (INDEPENDENT_AMBULATORY_CARE_PROVIDER_SITE_OTHER): Payer: Self-pay | Admitting: *Deleted

## 2024-10-20 NOTE — Telephone Encounter (Signed)
 Lmoam needs to rs from last week weather

## 2024-10-21 NOTE — Telephone Encounter (Signed)
 This pt cannot get this medication until he is seen. He has not been seen since 2024. He can see if his PCP can help him until he is seen.

## 2024-11-05 ENCOUNTER — Ambulatory Visit: Admitting: Family Medicine

## 2024-12-15 ENCOUNTER — Ambulatory Visit: Admitting: Gastroenterology
